# Patient Record
Sex: Female | Born: 2005
Health system: Southern US, Community
[De-identification: ages and names within clinical notes are randomized; demographics above are authoritative.]

## PROBLEM LIST (undated history)

## (undated) ENCOUNTER — Emergency Department (HOSPITAL_COMMUNITY): Admission: EM | Payer: Medicaid Other | Source: Home / Self Care

## (undated) ENCOUNTER — Emergency Department (HOSPITAL_COMMUNITY): Payer: Medicaid Other

## (undated) ENCOUNTER — Emergency Department (HOSPITAL_COMMUNITY): Admission: EM | Discharge status: Absent without leave | Payer: Medicaid Other

## (undated) DIAGNOSIS — F4310 Post-traumatic stress disorder, unspecified: Secondary | ICD-10-CM

## (undated) DIAGNOSIS — J45909 Unspecified asthma, uncomplicated: Secondary | ICD-10-CM

## (undated) DIAGNOSIS — F419 Anxiety disorder, unspecified: Secondary | ICD-10-CM

## (undated) DIAGNOSIS — F329 Major depressive disorder, single episode, unspecified: Secondary | ICD-10-CM

## (undated) DIAGNOSIS — I10 Essential (primary) hypertension: Secondary | ICD-10-CM

## (undated) DIAGNOSIS — R569 Unspecified convulsions: Secondary | ICD-10-CM

## (undated) DIAGNOSIS — G40909 Epilepsy, unspecified, not intractable, without status epilepticus: Secondary | ICD-10-CM

## (undated) DIAGNOSIS — F913 Oppositional defiant disorder: Secondary | ICD-10-CM

## (undated) DIAGNOSIS — F209 Schizophrenia, unspecified: Secondary | ICD-10-CM

## (undated) DIAGNOSIS — F3481 Disruptive mood dysregulation disorder: Secondary | ICD-10-CM

## (undated) HISTORY — DX: Essential (primary) hypertension: I10

## (undated) HISTORY — PX: BACK SURGERY: SHX140

## (undated) HISTORY — PX: CHOLECYSTECTOMY, LAPAROSCOPIC: SHX56

---
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
## 2023-01-03 ENCOUNTER — Other Ambulatory Visit: Payer: Self-pay

## 2023-01-03 ENCOUNTER — Encounter (HOSPITAL_BASED_OUTPATIENT_CLINIC_OR_DEPARTMENT_OTHER): Payer: Self-pay | Admitting: Emergency Medicine

## 2023-01-03 ENCOUNTER — Emergency Department (HOSPITAL_BASED_OUTPATIENT_CLINIC_OR_DEPARTMENT_OTHER)
Admission: EM | Admit: 2023-01-03 | Discharge: 2023-01-03 | Disposition: A | Discharge status: Home / Self Care | Payer: MEDICAID | Attending: Emergency Medicine | Admitting: Emergency Medicine

## 2023-01-03 ENCOUNTER — Emergency Department (HOSPITAL_BASED_OUTPATIENT_CLINIC_OR_DEPARTMENT_OTHER): Payer: MEDICAID

## 2023-01-03 DIAGNOSIS — Z91010 Allergy to peanuts: Secondary | ICD-10-CM | POA: Insufficient documentation

## 2023-01-03 DIAGNOSIS — R197 Diarrhea, unspecified: Secondary | ICD-10-CM

## 2023-01-03 DIAGNOSIS — R1031 Right lower quadrant pain: Secondary | ICD-10-CM | POA: Diagnosis not present

## 2023-01-03 DIAGNOSIS — R1032 Left lower quadrant pain: Secondary | ICD-10-CM | POA: Insufficient documentation

## 2023-01-03 DIAGNOSIS — R1030 Lower abdominal pain, unspecified: Secondary | ICD-10-CM

## 2023-01-03 LAB — COMPREHENSIVE METABOLIC PANEL
ALT: 24 U/L (ref 0–44)
AST: 21 U/L (ref 15–41)
Albumin: 3.9 g/dL (ref 3.5–5.0)
Alkaline Phosphatase: 111 U/L (ref 38–126)
Anion gap: 10 (ref 5–15)
BUN: 12 mg/dL (ref 6–20)
CO2: 26 mmol/L (ref 22–32)
Calcium: 9.5 mg/dL (ref 8.9–10.3)
Chloride: 104 mmol/L (ref 98–111)
Creatinine, Ser: 0.59 mg/dL (ref 0.44–1.00)
GFR, Estimated: >60 mL/min (ref >60)
Glucose, Bld: 100 mg/dL — ABNORMAL HIGH (ref 70–99)
Potassium: 2.9 mmol/L — ABNORMAL LOW (ref 3.5–5.1)
Sodium: 140 mmol/L (ref 135–145)
Total Bilirubin: 0.5 mg/dL (ref 0.3–1.2)
Total Protein: 8.1 g/dL (ref 6.5–8.1)

## 2023-01-03 LAB — HCG, SERUM, QUALITATIVE: Preg, Serum: NEGATIVE

## 2023-01-03 LAB — CBC
HCT: 36.6 % (ref 36.0–46.0)
Hemoglobin: 11.9 g/dL — ABNORMAL LOW (ref 12.0–15.0)
MCH: 27.7 pg (ref 26.0–34.0)
MCHC: 32.5 g/dL (ref 30.0–36.0)
MCV: 85.3 fL (ref 80.0–100.0)
Platelets: 420 10*3/uL — ABNORMAL HIGH (ref 150–400)
RBC: 4.29 MIL/uL (ref 3.87–5.11)
RDW: 13.6 % (ref 11.5–15.5)
WBC: 9.4 10*3/uL (ref 4.0–10.5)
nRBC: 0 % (ref 0.0–0.2)

## 2023-01-03 LAB — URINALYSIS, MICROSCOPIC (REFLEX)

## 2023-01-03 LAB — URINALYSIS, ROUTINE W REFLEX MICROSCOPIC
Bilirubin Urine: NEGATIVE
Glucose, UA: NEGATIVE mg/dL
Hgb urine dipstick: NEGATIVE
Ketones, ur: NEGATIVE mg/dL
Nitrite: NEGATIVE
Protein, ur: NEGATIVE mg/dL
Specific Gravity, Urine: 1.025 (ref 1.005–1.030)
pH: 7 (ref 5.0–8.0)

## 2023-01-03 LAB — LIPASE, BLOOD: Lipase: 29 U/L (ref 11–51)

## 2023-01-03 MED ORDER — METRONIDAZOLE 500 MG PO TABS: 500.0000 mg | ORAL_TABLET | Freq: Three times a day (TID) | ORAL | 0 refills | Status: DC

## 2023-01-03 MED ORDER — KETOROLAC TROMETHAMINE 30 MG/ML IJ SOLN
30.0000 mg | Freq: Once | INTRAMUSCULAR | Status: AC
  Administered 2023-01-03: 30 mg via INTRAVENOUS
  Filled 2023-01-03: qty 1

## 2023-01-03 MED ORDER — IOHEXOL 300 MG/ML  SOLN
100.0000 mL | Freq: Once | INTRAMUSCULAR | Status: AC | PRN
  Administered 2023-01-03: 100 mL via INTRAVENOUS

## 2023-01-03 MED ORDER — ONDANSETRON HCL 4 MG/2ML IJ SOLN
4.0000 mg | Freq: Once | INTRAMUSCULAR | Status: AC
  Administered 2023-01-03: 4 mg via INTRAVENOUS
  Filled 2023-01-03: qty 2

## 2023-01-03 NOTE — ED Triage Notes (Signed)
Patient arrived via EMS c/o abdominal pain x 2 weeks. Patient expresses diarrhea for same. Patient states 3 episodes today. Patient expresses nausea. Patient states RLQ/LLQ pain. Patient states 7/10 pain. Patient is AO x 4, VS WDL, slow gait.

## 2023-01-03 NOTE — ED Provider Notes (Signed)
[place] [facility] Provider Note   CSN: [id number] Arrival date & time: 01/03/23  0445     History  Chief Complaint  Patient presents with   Abdominal Pain    Amy Wall is a 17 y.o. female.  Patient is an 17 year old female with past medical history of oppositional defiant disorder, self mutilating behavior, major depression, cannabis use.  Patient presenting today with complaints of abdominal pain.  She describes a several week history of lower abdominal cramping and loose stools.  She denies any black or bloody stools.  No fevers or chills.  No ill contacts.  The history is provided by the patient.       Home Medications Prior to Admission medications   Medication Sig Start Date End Date Taking? Authorizing Provider  albuterol (VENTOLIN HFA) 108 (90 Base) MCG/ACT inhaler Inhale 2 puffs into the lungs every 6 (six) hours as needed for wheezing or shortness of breath. 07/08/22   Del Newman Nip, Tenna Child, FNP  budesonide-formoterol (SYMBICORT) 80-4.5 MCG/ACT inhaler Inhale 2 puffs into the lungs 2 (two) times daily. 07/08/22   Del Nigel Berthold, FNP  chlorhexidine (PERIDEX) 0.12 % solution Rinse with 1/2 oz twice a day & spit out.  Do not swallow. 12/20/22     EPINEPHrine 0.3 mg/0.3 mL IJ SOAJ injection Inject into  mid outer thigh 1 time for 1 dose. 12/13/22     hydrOXYzine (ATARAX) 25 MG tablet Take 50 mg by mouth every 6 (six) hours as needed. 10/23/22   [provider]  medroxyPROGESTERone (DEPO-PROVERA) 150 MG/ML injection Inject 1 mL (150 mg total) into the muscle every 3 (three) months. 07/29/22   Adline Potter, NP  ondansetron (ZOFRAN-ODT) 4 MG disintegrating tablet Dissolve 1 tablet (4 mg total) by mouth every 8 (eight) hours as needed for nausea. 12/17/22   Garlon Hatchet, PA-C  pantoprazole (PROTONIX) 40 MG tablet Take 1 tablet (40 mg total) by mouth daily. 12/27/22   Meredith Pel, NP  sertraline (ZOLOFT)  100 MG tablet Take 1 tablet (100 mg total) by mouth daily. 12/11/22     topiramate (TOPAMAX) 25 MG tablet Take 1 tablet (25 mg total) by mouth 2 (two) times daily. 12/11/22     traZODone (DESYREL) 100 MG tablet Take 1 tablet (100 mg total) by mouth every night. 12/11/22         Allergies    Peanut-containing drug products, Pineapple, Shellfish allergy, Strawberry extract, Charentais melon (french melon), and Chocolate    Review of Systems   Review of Systems  All other systems reviewed and are negative.   Physical Exam Updated Vital Signs BP (!) 143/86 (BP Location: Right Arm)   Pulse 90   Temp 98.3 F (36.8 C) (Oral)   Resp 17   Ht 5\' 3"  (1.6 m)   Wt 107.9 kg   LMP 12/22/2022 (Approximate)   SpO2 99%   BMI 42.14 kg/m  Physical Exam Vitals and nursing note reviewed.  Constitutional:      General: She is not in acute distress.    Appearance: She is well-developed. She is not diaphoretic.  HENT:     Head: Normocephalic and atraumatic.  Cardiovascular:     Rate and Rhythm: Normal rate and regular rhythm.     Heart sounds: No murmur heard.    No friction rub. No gallop.  Pulmonary:     Effort: Pulmonary effort is normal. No respiratory distress.     Breath  sounds: Normal breath sounds. No wheezing.  Abdominal:     General: Bowel sounds are normal. There is no distension.     Palpations: Abdomen is soft.     Tenderness: There is abdominal tenderness in the right lower quadrant, suprapubic area and left lower quadrant. There is no right CVA tenderness, left CVA tenderness, guarding or rebound.  Musculoskeletal:        General: Normal range of motion.     Cervical back: Normal range of motion and neck supple.  Skin:    General: Skin is warm and dry.  Neurological:     General: No focal deficit present.     Mental Status: She is alert and oriented to person, place, and time.     ED Results / Procedures / Treatments   Labs (all labs ordered are listed, but only abnormal  results are displayed) Labs Reviewed  LIPASE, BLOOD  COMPREHENSIVE METABOLIC PANEL  CBC  URINALYSIS, ROUTINE W REFLEX MICROSCOPIC  PREGNANCY, URINE    EKG None  Radiology No results found.  Procedures Procedures    Medications Ordered in ED Medications  ketorolac (TORADOL) 30 MG/ML injection 30 mg (has no administration in time range)  ondansetron (ZOFRAN) injection 4 mg (has no administration in time range)    ED Course/ Medical Decision Making/ A&P  Patient is an 17 year old female presenting with a 3-week history of lower abdominal cramping and loose stools.  Patient arrives here with stable vital signs and is afebrile.  She has tenderness across the lower abdomen, but no peritoneal signs.  Workup initiated including CBC, CMP, and lipase, all of which were basically normal.  Urinalysis clear and pregnancy test negative.  CT scan of the abdomen and pelvis showing no acute intra-abdominal process.  Patient given Toradol, Zofran, and IV fluids and seems to be feeling somewhat better.  I have witnessed no diarrhea throughout the entirety of her emergency department course.  Patient will be discharged with Flagyl given the prolonged nature of her diarrhea.  Patient has to follow-up if not improving.  Final Clinical Impression(s) / ED Diagnoses Final diagnoses:  None    Rx / DC Orders ED Discharge Orders     None         Geoffery Lyons, MD 01/03/23 [phone]

## 2023-01-03 NOTE — Discharge Instructions (Signed)
Begin taking Flagyl as prescribed.  Follow-up with your primary doctor if symptoms are not improving in the next few days.

## 2023-01-04 ENCOUNTER — Other Ambulatory Visit (HOSPITAL_COMMUNITY): Payer: Self-pay

## 2023-01-04 MED ORDER — METRONIDAZOLE 500 MG PO TABS
500.0000 mg | ORAL_TABLET | Freq: Three times a day (TID) | ORAL | 0 refills | Status: DC
  Filled 2023-01-04: qty 21, 7d supply, fill #0

## 2023-01-08 ENCOUNTER — Other Ambulatory Visit (HOSPITAL_COMMUNITY): Payer: Self-pay

## 2023-01-08 ENCOUNTER — Emergency Department (HOSPITAL_BASED_OUTPATIENT_CLINIC_OR_DEPARTMENT_OTHER)
Admission: EM | Admit: 2023-01-08 | Discharge: 2023-01-08 | Disposition: A | Discharge status: Home / Self Care | Payer: MEDICAID | Attending: Emergency Medicine | Admitting: Emergency Medicine

## 2023-01-08 ENCOUNTER — Encounter (HOSPITAL_BASED_OUTPATIENT_CLINIC_OR_DEPARTMENT_OTHER): Payer: Self-pay

## 2023-01-08 ENCOUNTER — Other Ambulatory Visit: Payer: Self-pay

## 2023-01-08 DIAGNOSIS — U071 COVID-19: Secondary | ICD-10-CM

## 2023-01-08 DIAGNOSIS — Z91010 Allergy to peanuts: Secondary | ICD-10-CM | POA: Insufficient documentation

## 2023-01-08 DIAGNOSIS — R112 Nausea with vomiting, unspecified: Secondary | ICD-10-CM

## 2023-01-08 DIAGNOSIS — R509 Fever, unspecified: Secondary | ICD-10-CM | POA: Diagnosis present

## 2023-01-08 LAB — RESP PANEL BY RT-PCR (RSV, FLU A&B, COVID)  RVPGX2
Influenza A by PCR: NEGATIVE
Influenza B by PCR: NEGATIVE
Resp Syncytial Virus by PCR: NEGATIVE
SARS Coronavirus 2 by RT PCR: POSITIVE — AB

## 2023-01-08 MED ORDER — PROCHLORPERAZINE MALEATE 10 MG PO TABS: 10.0000 mg | ORAL_TABLET | Freq: Three times a day (TID) | ORAL | 0 refills | Status: DC | PRN

## 2023-01-08 MED ORDER — ONDANSETRON 4 MG PO TBDP
4.0000 mg | ORAL_TABLET | Freq: Once | ORAL | Status: AC
  Administered 2023-01-08: 4 mg via ORAL
  Filled 2023-01-08: qty 1

## 2023-01-08 MED ORDER — PROCHLORPERAZINE MALEATE 10 MG PO TABS
10.0000 mg | ORAL_TABLET | Freq: Once | ORAL | Status: DC
  Filled 2023-01-08: qty 1

## 2023-01-08 NOTE — ED Notes (Signed)
This RN went to discharge pt and to administer compazine, however pt is not seen in RM, all monitoring device was laying on bed, no personal belongings noted in room. Assume pt has left the premises as pt also not seen to be in any of the restrooms. Thayer Ohm EMT-P witnessed pt leaving and showed her way to waiting room lobby a 10-15 mins ago. Compazine return to pyxis and discharge papers placed in shred bin.

## 2023-01-08 NOTE — ED Provider Notes (Signed)
[place] [facility]  Provider Note  CSN: [id number] Arrival date & time: 01/08/23 7829  History Chief Complaint  Patient presents with   Fever    Amy Wall is a 17 y.o. female with history of PNES, ODD, MDD and THC use with frequent ED visits for vomiting reports 1 day of sore throat, body aches, general malaise, low grade fever and vomiting. Took some nausea medication about 12 hours ago. Reported vomiting on the way here, drinking a Sprite during triage. She has been seen by GI, suspect THC as underlying issue. She continues to smoke THC but less often than previously.    Home Medications Prior to Admission medications   Medication Sig Start Date End Date Taking? Authorizing Provider  albuterol (VENTOLIN HFA) 108 (90 Base) MCG/ACT inhaler Inhale 2 puffs into the lungs every 6 (six) hours as needed for wheezing or shortness of breath. 07/08/22   Del Newman Nip, Tenna Child, FNP  budesonide-formoterol (SYMBICORT) 80-4.5 MCG/ACT inhaler Inhale 2 puffs into the lungs 2 (two) times daily. 07/08/22   Del Nigel Berthold, FNP  chlorhexidine (PERIDEX) 0.12 % solution Rinse with 1/2 oz twice a day & spit out.  Do not swallow. 12/20/22     EPINEPHrine 0.3 mg/0.3 mL IJ SOAJ injection Inject into  mid outer thigh 1 time for 1 dose. 12/13/22     hydrOXYzine (ATARAX) 25 MG tablet Take 50 mg by mouth every 6 (six) hours as needed. 10/23/22   [provider]  medroxyPROGESTERone (DEPO-PROVERA) 150 MG/ML injection Inject 1 mL (150 mg total) into the muscle every 3 (three) months. 07/29/22   Adline Potter, NP  metroNIDAZOLE (FLAGYL) 500 MG tablet Take 1 tablet (500 mg total) by mouth 3 (three) times daily. One po tid x 7 days 01/03/23   Geoffery Lyons, MD  metroNIDAZOLE (FLAGYL) 500 MG tablet Take 1 tablet (500 mg total) by mouth 3 (three) times daily for 7 days 01/03/23   Geoffery Lyons, MD  ondansetron (ZOFRAN-ODT) 4 MG disintegrating tablet Dissolve  1 tablet (4 mg total) by mouth every 8 (eight) hours as needed for nausea. 12/17/22   Garlon Hatchet, PA-C  pantoprazole (PROTONIX) 40 MG tablet Take 1 tablet (40 mg total) by mouth daily. 12/27/22   Meredith Pel, NP  sertraline (ZOLOFT) 100 MG tablet Take 1 tablet (100 mg total) by mouth daily. 12/11/22     topiramate (TOPAMAX) 25 MG tablet Take 1 tablet (25 mg total) by mouth 2 (two) times daily. 12/11/22     traZODone (DESYREL) 100 MG tablet Take 1 tablet (100 mg total) by mouth every night. 12/11/22        Allergies    Peanut-containing drug products, Pineapple, Shellfish allergy, Strawberry extract, Charentais melon (french melon), and Chocolate   Review of Systems   Review of Systems Please see HPI for pertinent positives and negatives  Physical Exam BP 128/83   Pulse (!) 108   Temp 99.7 F (37.6 C) (Oral)   Resp 18   LMP 12/22/2022 (Approximate)   SpO2 99%   Physical Exam Vitals and nursing note reviewed.  Constitutional:      Appearance: Normal appearance.  HENT:     Head: Normocephalic and atraumatic.     Nose: Nose normal.     Mouth/Throat:     Mouth: Mucous membranes are moist.  Eyes:     Extraocular Movements: Extraocular movements intact.     Conjunctiva/sclera: Conjunctivae normal.  Cardiovascular:  Rate and Rhythm: Normal rate.  Pulmonary:     Effort: Pulmonary effort is normal.     Breath sounds: Normal breath sounds.  Abdominal:     General: Abdomen is flat.     Palpations: Abdomen is soft.     Tenderness: There is no abdominal tenderness.  Musculoskeletal:        General: No swelling. Normal range of motion.     Cervical back: Neck supple.  Skin:    General: Skin is warm and dry.  Neurological:     General: No focal deficit present.     Mental Status: She is alert.  Psychiatric:        Mood and Affect: Mood normal.     ED Results / Procedures / Treatments   EKG None  Procedures Procedures  Medications Ordered in the ED Medications   ondansetron (ZOFRAN-ODT) disintegrating tablet 4 mg (4 mg Oral Given 01/08/23 0510)    Initial Impression and Plan  Patient well appearing, here with viral URI symptoms. Will check Covid/Flu/RSV swab, zofran for nausea. No indication for blood work or IVF.   ED Course   Clinical Course as of 01/08/23 0549  Sat Jan 08, 2023  0548 Covid is positive. Patient not vomiting in the ED. Recommend she continue with symptomatic care at home. PCP follow up, RTED for any other concerns.   [CS]    Clinical Course User Index [CS] Pollyann Savoy, MD     MDM Rules/Calculators/A&P Medical Decision Making Problems Addressed: COVID-19: acute illness or injury  Amount and/or Complexity of Data Reviewed Labs: ordered. Decision-making details documented in ED Course.  Risk Prescription drug management.     Final Clinical Impression(s) / ED Diagnoses Final diagnoses:  COVID-19    Rx / DC Orders ED Discharge Orders     None        Pollyann Savoy, MD 01/08/23 [phone]

## 2023-01-08 NOTE — ED Notes (Signed)
Pt has zofran at home from her visit on Monday. She reports having taken it 1 hour ago, and has vomited since.

## 2023-01-08 NOTE — ED Triage Notes (Signed)
Pt states that she has had fever, chills, headache, "runny nose", and nausea x 2 days.

## 2023-01-08 NOTE — ED Triage Notes (Signed)
The patient has covid. She has been vomiting today.

## 2023-01-08 NOTE — Discharge Instructions (Addendum)
You have been prescribed a different nausea medication to try called Compazine.  You may take 10 mg every 8 hours as needed for nausea and vomiting.  Do not take your Zofran while using this medication.  You may use up to 800mg  ibuprofen every 8 hours as needed for headache.  Do not exceed 2.4g of ibuprofen per day.  Return to the ER if you develop dizziness, shortness of breath, any other new or concerning symptoms.

## 2023-01-08 NOTE — ED Provider Notes (Signed)
[place] [facility] Provider Note   CSN: [id number] Arrival date & time: 01/08/23  2047     History  Chief Complaint  Patient presents with   Covid Positive   Emesis    Amy Wall is a 17 y.o. female ODD, MDD, who presents with concern for 3 episodes of vomiting today.  She was diagnosed with COVID yesterday and has Zofran at home which she has taken without relief from the vomiting.  She denies any difficulty breathing, fever or chills, dizziness.  Of note, she is seen frequently in the ER with complaints of nausea and vomiting which has been thought to be due to [facility] use.  Emesis      Home Medications Prior to Admission medications   Medication Sig Start Date End Date Taking? Authorizing Provider  prochlorperazine (COMPAZINE) 10 MG tablet Take 1 tablet (10 mg total) by mouth every 8 (eight) hours as needed for up to 3 days for nausea or vomiting. 01/08/23 01/11/23 Yes Arabella Merles, PA-C  albuterol (VENTOLIN HFA) 108 (90 Base) MCG/ACT inhaler Inhale 2 puffs into the lungs every 6 (six) hours as needed for wheezing or shortness of breath. 07/08/22   Del Newman Nip, Tenna Child, FNP  budesonide-formoterol (SYMBICORT) 80-4.5 MCG/ACT inhaler Inhale 2 puffs into the lungs 2 (two) times daily. 07/08/22   Del Nigel Berthold, FNP  chlorhexidine (PERIDEX) 0.12 % solution Rinse with 1/2 oz twice a day & spit out.  Do not swallow. 12/20/22     EPINEPHrine 0.3 mg/0.3 mL IJ SOAJ injection Inject into  mid outer thigh 1 time for 1 dose. 12/13/22     hydrOXYzine (ATARAX) 25 MG tablet Take 50 mg by mouth every 6 (six) hours as needed. 10/23/22   [provider]  medroxyPROGESTERone (DEPO-PROVERA) 150 MG/ML injection Inject 1 mL (150 mg total) into the muscle every 3 (three) months. 07/29/22   Adline Potter, NP  metroNIDAZOLE (FLAGYL) 500 MG tablet Take 1 tablet (500 mg total) by mouth 3 (three) times daily. One po tid x 7 days 01/03/23    Geoffery Lyons, MD  metroNIDAZOLE (FLAGYL) 500 MG tablet Take 1 tablet (500 mg total) by mouth 3 (three) times daily for 7 days 01/03/23   Geoffery Lyons, MD  ondansetron (ZOFRAN-ODT) 4 MG disintegrating tablet Dissolve 1 tablet (4 mg total) by mouth every 8 (eight) hours as needed for nausea. 12/17/22   Garlon Hatchet, PA-C  pantoprazole (PROTONIX) 40 MG tablet Take 1 tablet (40 mg total) by mouth daily. 12/27/22   Meredith Pel, NP  sertraline (ZOLOFT) 100 MG tablet Take 1 tablet (100 mg total) by mouth daily. 12/11/22     topiramate (TOPAMAX) 25 MG tablet Take 1 tablet (25 mg total) by mouth 2 (two) times daily. 12/11/22     traZODone (DESYREL) 100 MG tablet Take 1 tablet (100 mg total) by mouth every night. 12/11/22         Allergies    Peanut-containing drug products, Pineapple, Shellfish allergy, Strawberry extract, Charentais melon (french melon), and Chocolate    Review of Systems   Review of Systems  Gastrointestinal:  Positive for vomiting.    Physical Exam Updated Vital Signs BP 123/87   Pulse 93   Temp 99.4 F (37.4 C) (Oral)   Resp 18   Ht 5\' 3"  (1.6 m)   Wt 107 kg   LMP 12/22/2022 (Approximate)   SpO2 96%   BMI 41.79 kg/m  Physical Exam  Vitals and nursing note reviewed.  Constitutional:      General: She is not in acute distress.    Appearance: She is well-developed.     Comments: Well-appearing, no active vomiting on exam  HENT:     Head: Normocephalic and atraumatic.  Eyes:     Conjunctiva/sclera: Conjunctivae normal.  Cardiovascular:     Rate and Rhythm: Normal rate and regular rhythm.     Heart sounds: No murmur heard. Pulmonary:     Effort: Pulmonary effort is normal. No respiratory distress.     Breath sounds: Normal breath sounds.  Abdominal:     Palpations: Abdomen is soft.     Tenderness: There is no abdominal tenderness.  Musculoskeletal:        General: No swelling.     Cervical back: Neck supple.  Skin:    General: Skin is warm and dry.      Capillary Refill: Capillary refill takes less than 2 seconds.  Neurological:     Mental Status: She is alert.  Psychiatric:        Mood and Affect: Mood normal.     ED Results / Procedures / Treatments   Labs (all labs ordered are listed, but only abnormal results are displayed) Labs Reviewed - No data to display  EKG None  Radiology No results found.  Procedures Procedures    Medications Ordered in ED Medications  prochlorperazine (COMPAZINE) tablet 10 mg (has no administration in time range)    ED Course/ Medical Decision Making/ A&P                                 Medical Decision Making  17 y.o. female with pertinent past medical history of OOD, MDD, frequent visits to the ER for nausea and vomiting presents to the ED for concern of nausea and vomiting  Differential diagnosis includes but is not limited to nausea and vomiting associated with COVID, gastroenteritis  ED Course:  Patient concern for 3 episodes of vomiting earlier today that was not controlled with her Zofran which she has at home.  Upon evaluation today, she is not actively vomiting and is well-appearing.  Does not appear dry on exam, not hypotensive or tachycardic, no indication for IV fluids at this time.  She is hemodynamically stable, well appearing, appropriate for discharge at this time Will try compazine at home to see if this helps with her nausea and vomiting. She understands to discontinue use of zofran   Impression: COVID-19  Disposition:  The patient was discharged home with instructions to use Compazine as prescribed.  Discontinue use of Zofran. Return precautions given.  Lab Tests:  External records from outside source obtained and reviewed including ER visit from last night where she was diagnosed with COVID   Co morbidities that complicate the patient evaluation  ODD, MDD             Final Clinical Impression(s) / ED Diagnoses Final diagnoses:  Nausea and  vomiting, unspecified vomiting type  COVID-19    Rx / DC Orders ED Discharge Orders          Ordered    prochlorperazine (COMPAZINE) 10 MG tablet  Every 8 hours PRN        01/08/23 2142              Arabella Merles, PA-C 01/08/23 2214    Terrilee Files, MD 01/09/23 1021

## 2023-01-08 NOTE — ED Notes (Signed)
Pt's legal guardian was called [name]) to advised pt is d/c and reviewed discharge instructions and prescriptions, all questions and concerns answer.

## 2023-01-17 ENCOUNTER — Other Ambulatory Visit (HOSPITAL_COMMUNITY): Payer: Self-pay

## 2023-01-17 ENCOUNTER — Telehealth: Payer: Self-pay | Admitting: Internal Medicine

## 2023-01-17 DIAGNOSIS — R1013 Epigastric pain: Secondary | ICD-10-CM

## 2023-01-17 DIAGNOSIS — R197 Diarrhea, unspecified: Secondary | ICD-10-CM

## 2023-01-17 DIAGNOSIS — K529 Noninfective gastroenteritis and colitis, unspecified: Secondary | ICD-10-CM

## 2023-01-17 NOTE — Telephone Encounter (Signed)
Patient calls because she has continued to have abdominal pain and N&V despite the interventions started during her GI clinic visit on 8/19. These symptoms are the same symptoms she presented with during her last clinic visit, but she was said that she was told to call if her symptoms did not get better. She was diagnosed with COVID 1-2 weeks ago.  She is still able to tolerate some PO. Patient is hoping to get a sooner GI follow up appt and was told that there were some additional things that could be tested for when she was last seen in GI clinic. Will CC Willette Cluster to this telephone note.

## 2023-01-18 ENCOUNTER — Other Ambulatory Visit (HOSPITAL_COMMUNITY): Payer: Self-pay

## 2023-01-18 MED ORDER — PROCHLORPERAZINE MALEATE 10 MG PO TABS
10.0000 mg | ORAL_TABLET | Freq: Three times a day (TID) | ORAL | 0 refills | Status: DC
  Filled 2023-01-18: qty 9, 3d supply, fill #0

## 2023-01-19 ENCOUNTER — Other Ambulatory Visit (HOSPITAL_COMMUNITY): Payer: Self-pay

## 2023-01-20 ENCOUNTER — Encounter: Payer: Self-pay | Admitting: Internal Medicine

## 2023-01-20 ENCOUNTER — Other Ambulatory Visit (HOSPITAL_COMMUNITY)
Admission: RE | Admit: 2023-01-20 | Discharge: 2023-01-20 | Disposition: A | Discharge status: Home / Self Care | Payer: MEDICAID | Source: Ambulatory Visit | Attending: Internal Medicine | Admitting: Internal Medicine

## 2023-01-20 ENCOUNTER — Ambulatory Visit: Payer: MEDICAID | Admitting: Internal Medicine

## 2023-01-20 VITALS — BP 98/70 | HR 96 | Temp 99.6°F | Ht 63.0 in | Wt 225.8 lb

## 2023-01-20 DIAGNOSIS — Z113 Encounter for screening for infections with a predominantly sexual mode of transmission: Secondary | ICD-10-CM | POA: Diagnosis present

## 2023-01-20 DIAGNOSIS — Z3200 Encounter for pregnancy test, result unknown: Secondary | ICD-10-CM

## 2023-01-20 DIAGNOSIS — Z6841 Body Mass Index (BMI) 40.0 and over, adult: Secondary | ICD-10-CM

## 2023-01-20 DIAGNOSIS — R112 Nausea with vomiting, unspecified: Secondary | ICD-10-CM

## 2023-01-20 DIAGNOSIS — Z114 Encounter for screening for human immunodeficiency virus [HIV]: Secondary | ICD-10-CM

## 2023-01-20 DIAGNOSIS — Z91018 Allergy to other foods: Secondary | ICD-10-CM

## 2023-01-20 LAB — POCT PREGNANCY, URINE

## 2023-01-20 NOTE — Patient Instructions (Addendum)
Call GI doctor to set up a follow up appointment for nausea, vomiting, and diarrhea  No sexual activity
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
## 2023-01-21 ENCOUNTER — Other Ambulatory Visit: Payer: Self-pay | Admitting: *Deleted

## 2023-01-21 ENCOUNTER — Other Ambulatory Visit (HOSPITAL_COMMUNITY): Payer: Self-pay

## 2023-01-21 DIAGNOSIS — R112 Nausea with vomiting, unspecified: Secondary | ICD-10-CM

## 2023-01-21 DIAGNOSIS — R197 Diarrhea, unspecified: Secondary | ICD-10-CM

## 2023-01-21 DIAGNOSIS — K529 Noninfective gastroenteritis and colitis, unspecified: Secondary | ICD-10-CM

## 2023-01-21 DIAGNOSIS — R1013 Epigastric pain: Secondary | ICD-10-CM

## 2023-01-21 LAB — RPR: RPR Ser Ql: NONREACTIVE

## 2023-01-21 LAB — HEMOGLOBIN A1C: Hgb A1c MFr Bld: 5.2 % (ref 4.6–6.5)

## 2023-01-21 LAB — HIV ANTIBODY (ROUTINE TESTING W REFLEX): HIV 1&2 Ab, 4th Generation: NONREACTIVE

## 2023-01-21 MED ORDER — PROMETHAZINE HCL 25 MG PO TABS
12.5000 mg | ORAL_TABLET | Freq: Four times a day (QID) | ORAL | 0 refills | Status: DC | PRN
Start: 2023-01-21 — End: 2023-11-03
  Filled 2023-01-21: qty 20, 10d supply, fill #0

## 2023-01-21 MED ORDER — DICYCLOMINE HCL 20 MG PO TABS
20.0000 mg | ORAL_TABLET | Freq: Four times a day (QID) | ORAL | 1 refills | Status: DC | PRN
Start: 2023-01-21 — End: 2023-03-03
  Filled 2023-01-21: qty 40, 10d supply, fill #0

## 2023-01-21 NOTE — Telephone Encounter (Signed)
Unable to speak to the patient when trying to call to report Dr. Milas Hock recommendation. Called the patient's guardian and was able to inform her of the recommendations. Medications such as Bentyl and Phenergan were ordered; as well as labs. Ms. Janee Morn, the guardian, was also informed of the dietary restrictions and was also informed the patient should refrain from marijuana. She states she understood and agreed

## 2023-01-21 NOTE — Telephone Encounter (Signed)
Dr. Tomasa Rand patient. He supervised Gunnar Fusi. I will forward to him

## 2023-01-21 NOTE — Telephone Encounter (Signed)
Dr. Marina Goodell, Amy Wall's patient, please advise as DOD. Patient is still having issues with stomach pain, nausea and vomiting, and diarrhea. Note below is from 4 days ago.  Patient states Zofran is not working, nor is the fiber per instructed by provider. Patient called again this am:  Patient calls because she has continued to have abdominal pain and N&V despite the interventions started during her GI clinic visit on 8/19. These symptoms are the same symptoms she presented with during her last clinic visit, but she was said that she was told to call if her symptoms did not get better. She was diagnosed with COVID 1-2 weeks ago. She is still able to tolerate some PO. Patient is hoping to get a sooner GI follow up appt and was told that there were some additional things that could be tested for when she was last seen in GI clinic. Will CC Willette Cluster to this telephone note.

## 2023-01-21 NOTE — Telephone Encounter (Signed)
Inbound call from patient wishing to speak with nurse as soon as possible regarding previous note. Please advise, thank you.

## 2023-01-21 NOTE — Telephone Encounter (Signed)
Called patent in reference to a call 4 days ago. Unable to contact at this time. Guardian answered and informed the nurse of the contact number for the patient at [phone], called with no answer. I left a message for the patient to return my call.

## 2023-01-21 NOTE — Addendum Note (Signed)
Addended by: Michiel Sites on: 01/21/2023 03:58 PM   Modules accepted: Orders

## 2023-01-21 NOTE — Telephone Encounter (Signed)
Called patient via phone to make aware of Dr. Milas Hock orders, patient is not available. I left a message for the patient to return my call. Also informed patient that her guardian will be notified as well.

## 2023-01-24 ENCOUNTER — Other Ambulatory Visit: Payer: Self-pay | Admitting: Internal Medicine

## 2023-01-24 ENCOUNTER — Other Ambulatory Visit (HOSPITAL_COMMUNITY): Payer: Self-pay

## 2023-01-24 ENCOUNTER — Telehealth: Payer: Self-pay | Admitting: *Deleted

## 2023-01-24 DIAGNOSIS — B9689 Other specified bacterial agents as the cause of diseases classified elsewhere: Secondary | ICD-10-CM

## 2023-01-24 DIAGNOSIS — A599 Trichomoniasis, unspecified: Secondary | ICD-10-CM

## 2023-01-24 LAB — CERVICOVAGINAL ANCILLARY ONLY
Bacterial Vaginitis (gardnerella): POSITIVE — AB
Candida Glabrata: NEGATIVE
Candida Vaginitis: NEGATIVE
Chlamydia: NEGATIVE
Comment: NEGATIVE
Comment: NEGATIVE
Comment: NEGATIVE
Comment: NEGATIVE
Comment: NEGATIVE
Comment: NORMAL
Neisseria Gonorrhea: NEGATIVE
Trichomonas: POSITIVE — AB

## 2023-01-24 MED ORDER — IBUPROFEN 600 MG PO TABS
600.0000 mg | ORAL_TABLET | Freq: Every evening | ORAL | 1 refills | Status: DC
  Filled 2023-01-24: qty 16, 16d supply, fill #0
  Filled 2023-05-12: qty 16, 16d supply, fill #1

## 2023-01-24 MED ORDER — METRONIDAZOLE 500 MG PO TABS
500.0000 mg | ORAL_TABLET | Freq: Two times a day (BID) | ORAL | 0 refills | Status: AC
Start: 2023-01-24 — End: 2023-02-02
  Filled 2023-01-24: qty 14, 7d supply, fill #0

## 2023-01-24 NOTE — Telephone Encounter (Signed)
Patient returned the call from Friday on 01/21/23; patient was informed to stop smoking mariguana, start a bland diet and eat baked chicken, rice and cooked or steamed veggies. Avoid spicy foods, fatty/greasy foods and also avoid dairy products per Dr. Tomasa Rand. Patient agreed.

## 2023-01-25 ENCOUNTER — Telehealth: Payer: Self-pay | Admitting: Internal Medicine

## 2023-01-25 ENCOUNTER — Ambulatory Visit: Payer: MEDICAID | Admitting: Internal Medicine

## 2023-01-25 NOTE — Telephone Encounter (Signed)
Patient informed of test results

## 2023-01-25 NOTE — Telephone Encounter (Signed)
Patient called requesting to speak with nurse, patient stated she has started vomiting blood since she has started her " diet". Please advise.

## 2023-01-25 NOTE — Telephone Encounter (Signed)
Pt called and want to know if her test results came in yet

## 2023-01-25 NOTE — Telephone Encounter (Signed)
Called pts number and got no answer. Called pts guardian and was told the pt is otw to the emergency room. Explained that would be our recommendation if she were vomiting blood. Patient told her guardian that EMS was there to pick her up to take her to the ER.

## 2023-01-26 ENCOUNTER — Other Ambulatory Visit (HOSPITAL_COMMUNITY): Payer: Self-pay

## 2023-01-26 ENCOUNTER — Other Ambulatory Visit: Payer: MEDICAID

## 2023-01-26 DIAGNOSIS — R1013 Epigastric pain: Secondary | ICD-10-CM

## 2023-01-26 DIAGNOSIS — R112 Nausea with vomiting, unspecified: Secondary | ICD-10-CM | POA: Diagnosis not present

## 2023-01-26 DIAGNOSIS — R197 Diarrhea, unspecified: Secondary | ICD-10-CM | POA: Diagnosis not present

## 2023-01-26 DIAGNOSIS — K529 Noninfective gastroenteritis and colitis, unspecified: Secondary | ICD-10-CM

## 2023-01-26 LAB — SEDIMENTATION RATE: Sed Rate: 62 mm/h — ABNORMAL HIGH (ref 0–20)

## 2023-01-26 LAB — C-REACTIVE PROTEIN: CRP: 2.2 mg/dL (ref 0.5–20.0)

## 2023-01-29 ENCOUNTER — Other Ambulatory Visit (HOSPITAL_COMMUNITY): Payer: Self-pay

## 2023-01-30 LAB — GLIA (IGA/G) + TTG IGA
Antigliadin Abs, IgA: 2 units (ref 0–19)
Gliadin IgG: 1 units (ref 0–19)
Transglutaminase IgA: <2 U/mL (ref 0–3)

## 2023-01-31 ENCOUNTER — Ambulatory Visit: Payer: MEDICAID | Admitting: Internal Medicine

## 2023-02-01 ENCOUNTER — Ambulatory Visit: Payer: MEDICAID

## 2023-02-01 ENCOUNTER — Encounter: Payer: Self-pay | Admitting: Internal Medicine

## 2023-02-01 VITALS — BP 110/80 | HR 91 | Temp 98.0°F | Ht 63.0 in | Wt 221.0 lb

## 2023-02-01 DIAGNOSIS — Z3042 Encounter for surveillance of injectable contraceptive: Secondary | ICD-10-CM

## 2023-02-01 DIAGNOSIS — Z309 Encounter for contraceptive management, unspecified: Secondary | ICD-10-CM

## 2023-02-01 MED ORDER — MEDROXYPROGESTERONE ACETATE 150 MG/ML IM SUSY
PREFILLED_SYRINGE | Freq: Once | INTRAMUSCULAR | Status: AC
Start: 2023-02-01 — End: 2023-02-01

## 2023-02-01 NOTE — Progress Notes (Addendum)
  Last Depo-Provera: N/A. Side Effects if any: None. Serum HCG indicated? Negative . Depo-Provera 150 mg IM given by: Left deltoid . Next appointment due 05/03/23.

## 2023-02-02 ENCOUNTER — Encounter: Payer: Self-pay | Admitting: *Deleted

## 2023-02-02 LAB — ALPHA-GAL PANEL
Allergen, Mutton, f88: <0.1 kU/L
Allergen, Pork, f26: <0.1 kU/L
Beef: <0.1 kU/L
CLASS: 0
CLASS: 0
Class: 0
GALACTOSE-ALPHA-1,3-GALACTOSE IGE*: <0.1 kU/L (ref <0.10)

## 2023-02-02 LAB — INTERPRETATION:

## 2023-02-02 NOTE — Telephone Encounter (Signed)
Called patient and her guardian to explain the process of the scheduled Endoscopy on 03/16/23. Patient is still complaining about nausea, vomiting, and diarrhea. Willette Cluster, NP ordered Imodium 4 mg for the first diarrheal stool, then 2 mg for each diarrheal stool, up to 16 mg within a 24 hour period. Also spoke to both about the stool kits ordered for Calprotectin, H. Pylori and Pancreatic elastase. Patient picked up the stool kit, but never returned the samples. Informed the patient, these samples needed to be turned into the lab after completion. Gunnar Fusi ordered [employer] OTC medication for nausea and vomiting due to the inability of medications she is presently taking, not working. Informed the gaurdian the medication needs to be taken via the directions on the bottle. Also spoke with the guardian in reference to the endoscopy and having to be available for the patient while she is undergoing the procedure, having to be here 1 hour prior to scheduled time, and having someone also available to drive her home after the procedure. Informed both the guardian and the patient about the pre-visit which is also scheduled and any question that need to be asked, can be done at that time. Guardian wanted to know about the consent signing as well as the reason behind needing the procedure. Inform her a result can be mailed to her to show the reason the procedure is recommended. Both the patient and the guardian were satisfied, understood and agreed. Result note is being mailed to the guardian.

## 2023-02-02 NOTE — Telephone Encounter (Signed)
Called patient in reference to a call received today without noting why she wants to speak to a nurse. Patient states she is still having problems with stomach pains, vomiting and diarrhea. Patient states her diarrhea started today and she has been having diarrhea stools 4 times today already. Patient has medications she takes for these symptoms, she states the meds do not work and wants to know what else can be done? Dr. Tomasa Rand has ordered a endo procedure and patient has been scheduled for 03/16/23 with a pre-visit scheduled for 02/23/23. Patient is agreeable to having the endoscopy, wants something else at present for the nausea and discomfort. Patient was also informed to stop smoking marijuana, states she stopped smoking when told initially. Please advise.

## 2023-02-02 NOTE — Telephone Encounter (Signed)
Patient called back requesting for a nurse to call her social worker at [phone] [name].

## 2023-02-02 NOTE — Telephone Encounter (Signed)
Patient called requesting a call back from nurse. Did not give any information as what it is about. Please advise.

## 2023-02-10 ENCOUNTER — Emergency Department (HOSPITAL_COMMUNITY)
Admission: EM | Admit: 2023-02-10 | Discharge: 2023-02-12 | Disposition: A | Discharge status: Home / Self Care | Payer: MEDICAID | Attending: Emergency Medicine | Admitting: Emergency Medicine

## 2023-02-10 ENCOUNTER — Encounter (HOSPITAL_COMMUNITY): Payer: Self-pay

## 2023-02-10 ENCOUNTER — Other Ambulatory Visit: Payer: Self-pay

## 2023-02-10 DIAGNOSIS — T1491XA Suicide attempt, initial encounter: Secondary | ICD-10-CM | POA: Diagnosis present

## 2023-02-10 DIAGNOSIS — T39312A Poisoning by propionic acid derivatives, intentional self-harm, initial encounter: Secondary | ICD-10-CM | POA: Insufficient documentation

## 2023-02-10 DIAGNOSIS — T50902A Poisoning by unspecified drugs, medicaments and biological substances, intentional self-harm, initial encounter: Secondary | ICD-10-CM

## 2023-02-10 DIAGNOSIS — Z91010 Allergy to peanuts: Secondary | ICD-10-CM | POA: Insufficient documentation

## 2023-02-10 DIAGNOSIS — X838XXA Intentional self-harm by other specified means, initial encounter: Secondary | ICD-10-CM | POA: Diagnosis not present

## 2023-02-10 DIAGNOSIS — T450X2A Poisoning by antiallergic and antiemetic drugs, intentional self-harm, initial encounter: Secondary | ICD-10-CM | POA: Insufficient documentation

## 2023-02-10 DIAGNOSIS — F332 Major depressive disorder, recurrent severe without psychotic features: Secondary | ICD-10-CM | POA: Diagnosis present

## 2023-02-10 LAB — COMPREHENSIVE METABOLIC PANEL
ALT: 15 U/L (ref 0–44)
AST: 18 U/L (ref 15–41)
Albumin: 3.4 g/dL — ABNORMAL LOW (ref 3.5–5.0)
Alkaline Phosphatase: 81 U/L (ref 38–126)
Anion gap: 12 (ref 5–15)
BUN: 9 mg/dL (ref 6–20)
CO2: 21 mmol/L — ABNORMAL LOW (ref 22–32)
Calcium: 8.9 mg/dL (ref 8.9–10.3)
Chloride: 104 mmol/L (ref 98–111)
Creatinine, Ser: 0.42 mg/dL — ABNORMAL LOW (ref 0.44–1.00)
GFR, Estimated: >60 mL/min (ref >60)
Glucose, Bld: 102 mg/dL — ABNORMAL HIGH (ref 70–99)
Potassium: 3.2 mmol/L — ABNORMAL LOW (ref 3.5–5.1)
Sodium: 137 mmol/L (ref 135–145)
Total Bilirubin: 0.2 mg/dL — ABNORMAL LOW (ref 0.3–1.2)
Total Protein: 7.1 g/dL (ref 6.5–8.1)

## 2023-02-10 LAB — CBC WITH DIFFERENTIAL/PLATELET
Abs Immature Granulocytes: 0.03 10*3/uL (ref 0.00–0.07)
Basophils Absolute: 0 10*3/uL (ref 0.0–0.1)
Basophils Relative: 0 %
Eosinophils Absolute: 0.1 10*3/uL (ref 0.0–0.5)
Eosinophils Relative: 1 %
HCT: 35 % — ABNORMAL LOW (ref 36.0–46.0)
Hemoglobin: 11.2 g/dL — ABNORMAL LOW (ref 12.0–15.0)
Immature Granulocytes: 0 %
Lymphocytes Relative: 31 %
Lymphs Abs: 3.2 10*3/uL (ref 0.7–4.0)
MCH: 28.3 pg (ref 26.0–34.0)
MCHC: 32 g/dL (ref 30.0–36.0)
MCV: 88.4 fL (ref 80.0–100.0)
Monocytes Absolute: 0.8 10*3/uL (ref 0.1–1.0)
Monocytes Relative: 8 %
Neutro Abs: 6.1 10*3/uL (ref 1.7–7.7)
Neutrophils Relative %: 60 %
Platelets: 428 10*3/uL — ABNORMAL HIGH (ref 150–400)
RBC: 3.96 MIL/uL (ref 3.87–5.11)
RDW: 13.1 % (ref 11.5–15.5)
WBC: 10.2 10*3/uL (ref 4.0–10.5)
nRBC: 0 % (ref 0.0–0.2)

## 2023-02-10 LAB — HCG, SERUM, QUALITATIVE: Preg, Serum: NEGATIVE

## 2023-02-10 LAB — RAPID URINE DRUG SCREEN, HOSP PERFORMED
Amphetamines: NOT DETECTED
Barbiturates: NOT DETECTED
Benzodiazepines: NOT DETECTED
Cocaine: NOT DETECTED
Opiates: NOT DETECTED
Tetrahydrocannabinol: NOT DETECTED

## 2023-02-10 LAB — ACETAMINOPHEN LEVEL
Acetaminophen (Tylenol), Serum: <10 ug/mL — ABNORMAL LOW (ref 10–30)
Acetaminophen (Tylenol), Serum: 11 ug/mL (ref 10–30)

## 2023-02-10 LAB — SALICYLATE LEVEL: Salicylate Lvl: <7 mg/dL — ABNORMAL LOW (ref 7.0–30.0)

## 2023-02-10 LAB — ETHANOL: Alcohol, Ethyl (B): <10 mg/dL (ref <10)

## 2023-02-10 MED ORDER — DICYCLOMINE HCL 20 MG PO TABS: 20.0000 mg | ORAL_TABLET | Freq: Four times a day (QID) | ORAL | Status: DC | PRN

## 2023-02-10 MED ORDER — TRAZODONE HCL 100 MG PO TABS
100.0000 mg | ORAL_TABLET | Freq: Every day | ORAL | Status: DC
  Administered 2023-02-10 – 2023-02-11 (×2): 100 mg via ORAL
  Filled 2023-02-10 (×2): qty 1

## 2023-02-10 MED ORDER — MOMETASONE FURO-FORMOTEROL FUM 100-5 MCG/ACT IN AERO: 2.0000 | INHALATION_SPRAY | Freq: Two times a day (BID) | RESPIRATORY_TRACT | Status: DC | PRN

## 2023-02-10 MED ORDER — PROMETHAZINE HCL 25 MG PO TABS: 12.5000 mg | ORAL_TABLET | Freq: Four times a day (QID) | ORAL | Status: DC | PRN

## 2023-02-10 MED ORDER — IBUPROFEN 200 MG PO TABS: 600.0000 mg | ORAL_TABLET | Freq: Three times a day (TID) | ORAL | Status: DC | PRN

## 2023-02-10 MED ORDER — PANTOPRAZOLE SODIUM 40 MG PO TBEC
40.0000 mg | DELAYED_RELEASE_TABLET | Freq: Every day | ORAL | Status: DC
  Administered 2023-02-11 – 2023-02-12 (×2): 40 mg via ORAL
  Filled 2023-02-10 (×3): qty 1

## 2023-02-10 MED ORDER — ACETAMINOPHEN 325 MG PO TABS
650.0000 mg | ORAL_TABLET | ORAL | Status: DC | PRN
  Administered 2023-02-11: 650 mg via ORAL
  Filled 2023-02-10: qty 2

## 2023-02-10 MED ORDER — ALBUTEROL SULFATE HFA 108 (90 BASE) MCG/ACT IN AERS: 2.0000 | INHALATION_SPRAY | Freq: Four times a day (QID) | RESPIRATORY_TRACT | Status: DC | PRN

## 2023-02-10 MED ORDER — SERTRALINE HCL 50 MG PO TABS
100.0000 mg | ORAL_TABLET | Freq: Every day | ORAL | Status: DC
  Administered 2023-02-11 – 2023-02-12 (×2): 100 mg via ORAL
  Filled 2023-02-10 (×3): qty 2

## 2023-02-10 MED ORDER — ONDANSETRON HCL 4 MG PO TABS: 4.0000 mg | ORAL_TABLET | Freq: Three times a day (TID) | ORAL | Status: DC | PRN

## 2023-02-10 MED ORDER — ALUM & MAG HYDROXIDE-SIMETH 200-200-20 MG/5ML PO SUSP
30.0000 mL | Freq: Four times a day (QID) | ORAL | Status: DC | PRN
  Filled 2023-02-10: qty 30

## 2023-02-10 MED ORDER — TOPIRAMATE 25 MG PO TABS
25.0000 mg | ORAL_TABLET | Freq: Two times a day (BID) | ORAL | Status: DC
  Administered 2023-02-10 – 2023-02-12 (×4): 25 mg via ORAL
  Filled 2023-02-10 (×5): qty 1

## 2023-02-10 NOTE — ED Notes (Signed)
Patient to room 42. Patient ambulated to room .   Patient oriented to unit and room.   Patient resting at this time.  Breathes equal and unlabored.

## 2023-02-10 NOTE — ED Notes (Signed)
[name] spoke with [name] (Legal Guardian) for collateral. Pts guardian reports that pt has a history of self injurious behaviors such as cutting and overdosing with pills. Pts guardian believes that some of pts behavior is for attention. Pt has a number of medical issues that she is struggling with including GI and dental issues.   Pt has little support being estranged from family members and having grown up in foster care since the age of 64. Pt lived in a group home up until two months ago. Pt currently lives in a hotel.  Pts ACT team is through [employer] [employer]  [name], [facility]  02/10/23

## 2023-02-10 NOTE — ED Provider Notes (Signed)
Care transferred to me.  Patient's 4-hour Tylenol level is 11 which is well below the treatment line.  Repeat EKG shows a normal QTc.  Has some anterior T wave inversions but when I am talking to her she denies chest pain.  I suspect these are from lead placement.  She is medically stable for psychiatric consultation.    Pricilla Loveless, MD 02/10/23 [phone]

## 2023-02-10 NOTE — ED Triage Notes (Signed)
Pt presents via EMS c/o intentional drug ingestion. Pt reports took several of her own medications to "numb the pain". Denies SI at this time. EMS reports possible took Motrin, Zofran, Compazine, and Dayquil. Pt unable to verbalize which meds were taken.

## 2023-02-10 NOTE — ED Notes (Addendum)
Just spoke to  "sister" asking for a update and that if pt doesn't not have a ride home to call her and she will get her if pt ever gets discharged from ER.  [phone]. Sister recommends call her as soon as pt wakes up.

## 2023-02-10 NOTE — Progress Notes (Signed)
LCSW Progress Note  [id number]   SHELENE KRAGE  02/10/2023  10:21 PM    Inpatient Behavioral Health Placement  Pt meets inpatient criteria per [name], PMHNP. There are no available beds within [facility] per [facility] AC Kelly Southard,RN. Referral was sent to the following facilities;    Destination  Service Provider Address Phone Fax  [facility]  [address] [place] [address] [phone]  [facility]  [address] [place], [place] [address] [phone]  [facility]  [address], [place] [address] [phone]  [facility]  [address] [place] [address] [phone]  [facility] [address]  [address] [place] [address] [phone]  [facility]  [address] [place], [place] [address] [phone]  [facility]  [address] [place] [address] [phone]  [facility]  [address] [place] [address] [phone]  [facility]  [address], [place] [address] [phone]  [facility] EFAX  [address] [place], [place] [phone]  [facility]  [place] [phone]  [facility]  [address] [place], [place] [address] [phone]  [facility]  [address] [place] [address] [phone]  [facility]  [address], [place] [address] [phone]  [facility]  [address] [place] [address] [phone]  [facility]  [address], [place] [address] [phone]  [facility]  [address] [place], [place] [address] [phone]  [facility]  [address] [place] [address] [phone]  [facility]  [address] [place] [address] [phone]     Situation ongoing,  CSW will follow up.    Maryjean Ka, MSW, [facility] 02/10/2023 10:21 PM

## 2023-02-10 NOTE — Consult Note (Signed)
[facility] ED ASSESSMENT   Reason for Consult: Psych Consult Referring Physician: Dr. Criss Alvine  Patient Identification: Amy Wall MRN:  [id number] ED Chief Complaint: MDD (major depressive disorder), recurrent severe, without psychosis (HCC)  Diagnosis:  Principal Problem:   MDD (major depressive disorder), recurrent severe, without psychosis (HCC) Active Problems:   Suicidal behavior with attempted self-injury [facility])   ED Assessment Time Calculation: Start Time: 0945 Stop Time: 1025 Total Time in Minutes (Assessment Completion): 40   Subjective: Amy Wall is a 17 y.o. female patient presents via EMS c/o intentional drug ingestion. Pt reports took several of her own medications to "numb the pain". Denies SI at this time. EMS reports possible took Motrin, Zofran, Compazine, and Dayquil. Pt unable to verbalize which meds were taken.     HPI: Amy Wall, 17 y.o., female patient seen face to face by this provider, consulted with Dr. Lucianne Muss; and chart reviewed on 02/10/23.  On evaluation Amy Wall reports that she has been going through a lot of stress, because of dealing with her past.  She states that she has been staying in a hotel for a couple of months, due to leaving the group home she was staying in. She currently denies SI/HI/AVH, says she has no contact with her biological parents or any family and has been in DSS system since the age of 89. She says she does not have a job, and just stays in the hotel room and watch tv and think about her past, feeling depressed. Patient says she has a felony charge due to assaulting a security guard, and her next court date is March 23, 2023. She also says she has a 2024 larceny charge that has been resolved. Patient says that she has had multiple psychiatric diagnosis and multiple inpatient admissions and her last admission was two months ago , unable to recall the name of the inpatient facility. She says she is supposed to be  on medication but is non-compliant because she does not like the way the medications make her feel and "I don't like meds". Patient has a community support team, but does not feel they listen to her, says she has a hard time, with financial and food help from them. She wants a good support system, says she is tired of doing things alone, and it feels like no one wants to help her.  Patient endorses that she has had therapy in the past, but feels like her therapist were not listening to her, but is open to having therapy again.  She also endorses having a past history of overdose, which led to having her to get her gallbladder removed, she now has an appointment for endoscopy procedure this month in October to see if she has ulcers.  Patient also endorses smoking marijuana, drinking alcohol, states she drinks liquor, not really sure how much she drinks daily, also vapes nicotine.  Patient UDS is negative for any illicit substances, BAL is less than 10.  During evaluation Amy Wall is laying in bed, just waking up, eating breakfast in no acute distress. She is alert, oriented x 4, calm, cooperative and attentive. Her mood is sad with congruent affect. She has normal speech, and behavior.  Objectively there is no evidence of psychosis/mania or delusional thinking.  Patient is able to converse coherently, goal directed thoughts, no distractibility, or pre-occupation.  She also denies suicidal/self-harm/homicidal ideation, psychosis, and paranoia.  Patient answered question appropriately.     Psychiatric History: MDD  With psychosis, DMDD (disruptive mood dysregulation disorder), cannabis use disorder, oppositional defiant disorder, PTSD, anxiety and SI.  Patient reports schizophrenia diagnosis   Risk to Self or Others: Is the patient at risk to self? Yes Has the patient been a risk to self in the past 6 months? Yes Has the patient been a risk to self within the distant past? Yes Is the patient a risk  to others? No Has the patient been a risk to others in the past 6 months? No Has the patient been a risk to others within the distant past? No  [place] Scale:  Flowsheet Row ED from 02/10/2023 in [facility] Emergency Department at [facility] Most recent reading at 02/10/2023 11:40 AM ED from 01/08/2023 in [facility] Emergency Department at [facility] Most recent reading at 01/08/2023  8:52 PM ED from 01/08/2023 in [facility] Emergency Department at [facility] Most recent reading at 01/08/2023  4:47 AM  C-SSRS RISK CATEGORY No Risk No Risk No Risk       AIMS:  , , ,  ,   ASAM:    Substance Abuse:     Past Medical History:  Past Medical History:  Diagnosis Date   Anxiety    Asthma    DMDD (disruptive mood dysregulation disorder) (HCC)    Epilepsy (HCC)    pseuodoseizures per chart   HTN (hypertension)    Major depressive disorder    Oppositional defiant disorder    PTSD (post-traumatic stress disorder)    Schizophrenia (HCC)    Seizures (HCC)     Past Surgical History:  Procedure Laterality Date   BACK SURGERY     CHOLECYSTECTOMY, LAPAROSCOPIC     Family History:  Family History  Family history unknown: Yes    Social History:  Social History   Substance and Sexual Activity  Alcohol Use Not Currently   Alcohol/week: 4.0 standard drinks of alcohol   Types: 2 Glasses of wine, 2 Shots of liquor per week     Social History   Substance and Sexual Activity  Drug Use Yes   Types: Marijuana, Methamphetamines   Comment: daily    Social History   Socioeconomic History   Marital status: Single    Spouse name: Not on file   Number of children: 0   Years of education: Not on file   Highest education level: Not on file  Occupational History   Not on file  Tobacco Use   Smoking status: Every Day    Types: Cigars    Passive exposure: Yes   Smokeless tobacco: Never   Tobacco comments:    Lack and mild, marijuana  Vaping Use    Vaping status: Every Day  Substance and Sexual Activity   Alcohol use: Not Currently    Alcohol/week: 4.0 standard drinks of alcohol    Types: 2 Glasses of wine, 2 Shots of liquor per week   Drug use: Yes    Types: Marijuana, Methamphetamines    Comment: daily   Sexual activity: Yes    Birth control/protection: Condom  Other Topics Concern   Not on file  Social History Narrative   She lives with her uncle and aunt.   She has six siblings.   Social Determinants of Health   Financial Resource Strain: Medium Risk (06/28/2022)   Overall Financial Resource Strain (CARDIA)    Difficulty of Paying Living Expenses: Somewhat hard  Food Insecurity: No Food Insecurity (09/23/2022)   Hunger Vital Sign  Worried About Programme researcher, broadcasting/film/video in the Last Year: Never true    Ran Out of Food in the Last Year: Never true  Transportation Needs: No Transportation Needs (09/23/2022)   PRAPARE - Administrator, Civil Service (Medical): No    Lack of Transportation (Non-Medical): No  Physical Activity: Inactive (06/28/2022)   Exercise Vital Sign    Days of Exercise per Week: 0 days    Minutes of Exercise per Session: 0 min  Stress: No Stress Concern Present (06/28/2022)   [employer] of Occupational Health - Occupational Stress Questionnaire    Feeling of Stress : Only a little  Social Connections: Moderately Isolated (06/28/2022)   Social Connection and Isolation Panel [NHANES]    Frequency of Communication with Friends and Family: Once a week    Frequency of Social Gatherings with Friends and Family: Once a week    Attends Religious Services: More than 4 times per year    Active Member of [employer] or Organizations: Yes    Attends Banker Meetings: 1 to 4 times per year    Marital Status: Never married      Allergies:   Allergies  Allergen Reactions   Peanut-Containing Drug Products Anaphylaxis, Hives and Itching   Pineapple Anaphylaxis and Hives   Shellfish Allergy  Anaphylaxis and Hives   Strawberry Extract Anaphylaxis and Hives   Charentais Melon (French Melon) Swelling    Other Reaction(s): Other (see comments)  Numbness and swelling of throat and tongue.   Chocolate Itching    Throat close    Labs:  Results for orders placed or performed during the hospital encounter of 02/10/23 (from the past 48 hour(s))  Comprehensive metabolic panel     Status: Abnormal   Collection Time: 02/10/23  4:00 AM  Result Value Ref Range   Sodium 137 135 - 145 mmol/L   Potassium 3.2 (L) 3.5 - 5.1 mmol/L   Chloride 104 98 - 111 mmol/L   CO2 21 (L) 22 - 32 mmol/L   Glucose, Bld 102 (H) 70 - 99 mg/dL    Comment: Glucose reference range applies only to samples taken after fasting for at least 8 hours.   BUN 9 6 - 20 mg/dL   Creatinine, Ser 1.61 (L) 0.44 - 1.00 mg/dL   Calcium 8.9 8.9 - 09.6 mg/dL   Total Protein 7.1 6.5 - 8.1 g/dL   Albumin 3.4 (L) 3.5 - 5.0 g/dL   AST 18 15 - 41 U/L   ALT 15 0 - 44 U/L   Alkaline Phosphatase 81 38 - 126 U/L   Total Bilirubin 0.2 (L) 0.3 - 1.2 mg/dL   GFR, Estimated >04 >54 mL/min    Comment: (NOTE) Calculated using the CKD-EPI Creatinine Equation (2021)    Anion gap 12 5 - 15    Comment: Performed at [facility], [address] [place], [place] [address]  Ethanol     Status: None   Collection Time: 02/10/23  4:00 AM  Result Value Ref Range   Alcohol, Ethyl (B) <10 <10 mg/dL    Comment: (NOTE) Lowest detectable limit for serum alcohol is 10 mg/dL.  For medical purposes only. Performed at [facility], [address], [place], [place] [address]   CBC with Diff     Status: Abnormal   Collection Time: 02/10/23  4:00 AM  Result Value Ref Range   WBC 10.2 4.0 - 10.5 K/uL   RBC 3.96 3.87 -  5.11 MIL/uL   Hemoglobin 11.2 (L) 12.0 - 15.0 g/dL   HCT 34.7 (L) 42.5 - 95.6 %   MCV 88.4 80.0 - 100.0 fL   MCH 28.3 26.0 - 34.0 pg   MCHC 32.0 30.0 - 36.0 g/dL   RDW 38.7 56.4 - 33.2 %    Platelets 428 (H) 150 - 400 K/uL   nRBC 0.0 0.0 - 0.2 %   Neutrophils Relative % 60 %   Neutro Abs 6.1 1.7 - 7.7 K/uL   Lymphocytes Relative 31 %   Lymphs Abs 3.2 0.7 - 4.0 K/uL   Monocytes Relative 8 %   Monocytes Absolute 0.8 0.1 - 1.0 K/uL   Eosinophils Relative 1 %   Eosinophils Absolute 0.1 0.0 - 0.5 K/uL   Basophils Relative 0 %   Basophils Absolute 0.0 0.0 - 0.1 K/uL   Immature Granulocytes 0 %   Abs Immature Granulocytes 0.03 0.00 - 0.07 K/uL    Comment: Performed at [facility], [address], [place], [place] [address]  hCG, serum, qualitative     Status: None   Collection Time: 02/10/23  4:00 AM  Result Value Ref Range   Preg, Serum NEGATIVE NEGATIVE    Comment:        THE SENSITIVITY OF THIS METHODOLOGY IS >10 mIU/mL. Performed at [facility], [address] [place], [place] [address]   Salicylate level     Status: Abnormal   Collection Time: 02/10/23  4:00 AM  Result Value Ref Range   Salicylate Lvl <7.0 (L) 7.0 - 30.0 mg/dL    Comment: Performed at [facility], [address] [place], [place] [address]  Acetaminophen level     Status: Abnormal   Collection Time: 02/10/23  4:00 AM  Result Value Ref Range   Acetaminophen (Tylenol), Serum <10 (L) 10 - 30 ug/mL    Comment: (NOTE) Therapeutic concentrations vary significantly. A range of 10-30 ug/mL  may be an effective concentration for many patients. However, some  are best treated at concentrations outside of this range. Acetaminophen concentrations >150 ug/mL at 4 hours after ingestion  and >50 ug/mL at 12 hours after ingestion are often associated with  toxic reactions.  Performed at [facility] [address], [place], [place] [address]   Urine rapid drug screen (hosp performed)     Status: None   Collection Time: 02/10/23  6:32 AM  Result Value Ref Range   Opiates NONE DETECTED NONE DETECTED   Cocaine NONE DETECTED NONE  DETECTED   Benzodiazepines NONE DETECTED NONE DETECTED   Amphetamines NONE DETECTED NONE DETECTED   Tetrahydrocannabinol NONE DETECTED NONE DETECTED   Barbiturates NONE DETECTED NONE DETECTED    Comment: (NOTE) DRUG SCREEN FOR MEDICAL PURPOSES ONLY.  IF CONFIRMATION IS NEEDED FOR ANY PURPOSE, NOTIFY LAB WITHIN 5 DAYS.  LOWEST DETECTABLE LIMITS FOR URINE DRUG SCREEN Drug Class                     Cutoff (ng/mL) Amphetamine and metabolites    1000 Barbiturate and metabolites    200 Benzodiazepine                 200 Opiates and metabolites        300 Cocaine and metabolites        300 THC  50 Performed at [facility], [address] [place], [place] [address]   Acetaminophen level     Status: None   Collection Time: 02/10/23  7:30 AM  Result Value Ref Range   Acetaminophen (Tylenol), Serum 11 10 - 30 ug/mL    Comment: (NOTE) Therapeutic concentrations vary significantly. A range of 10-30 ug/mL  may be an effective concentration for many patients. However, some  are best treated at concentrations outside of this range. Acetaminophen concentrations >150 ug/mL at 4 hours after ingestion  and >50 ug/mL at 12 hours after ingestion are often associated with  toxic reactions.  Performed at [facility], [address] [place], [place] [address]     Current Facility-Administered Medications  Medication Dose Route Frequency Provider Last Rate Last Admin   acetaminophen (TYLENOL) tablet 650 mg  650 mg Oral Q4H PRN Pricilla Loveless, MD       albuterol (VENTOLIN HFA) 108 (90 Base) MCG/ACT inhaler 2 puff  2 puff Inhalation Q6H PRN Pricilla Loveless, MD       alum & mag hydroxide-simeth (MAALOX/MYLANTA) 200-200-20 MG/5ML suspension 30 mL  30 mL Oral Q6H PRN Pricilla Loveless, MD       dicyclomine (BENTYL) tablet 20 mg  20 mg Oral Q6H PRN Pricilla Loveless, MD       mometasone-formoterol (DULERA) 100-5 MCG/ACT inhaler 2 puff  2  puff Inhalation BID PRN Pricilla Loveless, MD       ondansetron [facility]) tablet 4 mg  4 mg Oral Q8H PRN Pricilla Loveless, MD       pantoprazole (PROTONIX) EC tablet 40 mg  40 mg Oral Daily Pricilla Loveless, MD   40 mg at 02/10/23 1143   promethazine (PHENERGAN) tablet 12.5 mg  12.5 mg Oral Q6H PRN Pricilla Loveless, MD       sertraline (ZOLOFT) tablet 100 mg  100 mg Oral Daily Pricilla Loveless, MD   100 mg at 02/10/23 1142   topiramate (TOPAMAX) tablet 25 mg  25 mg Oral BID Pricilla Loveless, MD   25 mg at 02/10/23 1142   traZODone (DESYREL) tablet 100 mg  100 mg Oral QHS Pricilla Loveless, MD       Current Outpatient Medications  Medication Sig Dispense Refill   albuterol (VENTOLIN HFA) 108 (90 Base) MCG/ACT inhaler Inhale 2 puffs into the lungs every 6 (six) hours as needed for wheezing or shortness of breath. 8 g 2   budesonide-formoterol (SYMBICORT) 80-4.5 MCG/ACT inhaler Inhale 2 puffs into the lungs 2 (two) times daily. 1 each 12   dicyclomine (BENTYL) 20 MG tablet Take 1 tablet (20 mg total) by mouth every 6 (six) hours as needed for spasms (AB pain). 40 tablet 1   EPINEPHrine 0.3 mg/0.3 mL IJ SOAJ injection Inject into  mid outer thigh 1 time for 1 dose. 2 each 0   ibuprofen (ADVIL) 600 MG tablet Take 1 tablet (600 mg total) by mouth a night time as needed. 16 tablet 1   medroxyPROGESTERone (DEPO-PROVERA) 150 MG/ML injection Inject 1 mL (150 mg total) into the muscle every 3 (three) months. 1 mL 4   ondansetron (ZOFRAN-ODT) 4 MG disintegrating tablet Dissolve 1 tablet (4 mg total) by mouth every 8 (eight) hours as needed for nausea. 10 tablet 0   pantoprazole (PROTONIX) 40 MG tablet Take 1 tablet (40 mg total) by mouth daily. 30 tablet 5   promethazine (PHENERGAN) 25 MG tablet Take 0.5 tablets (12.5 mg total) by mouth every 6 (six) hours  as needed for nausea or vomiting. 20 tablet 0   sertraline (ZOLOFT) 100 MG tablet Take 1 tablet (100 mg total) by mouth daily. 30 tablet 0   topiramate (TOPAMAX) 25  MG tablet Take 1 tablet (25 mg total) by mouth 2 (two) times daily. 60 tablet 0   traZODone (DESYREL) 100 MG tablet Take 1 tablet (100 mg total) by mouth every night. 30 tablet 0   prochlorperazine (COMPAZINE) 10 MG tablet Take 1 tablet (10 mg total) by mouth every 8 (eight) hours. (Patient not taking: Reported on 01/20/2023) 9 tablet 0     Musculoskeletal: Strength & Muscle Tone: within normal limits Gait & Station: normal Patient leans: N/A   Psychiatric Specialty Exam: Presentation  General Appearance:  Appropriate for Environment  Eye Contact: Good  Speech: Clear and Coherent  Speech Volume: Normal  Handedness: Right   Mood and Affect  Mood: Hopeless; Depressed  Affect: Appropriate; Depressed   Thought Process  Thought Processes: Coherent  Descriptions of Associations:Intact  Orientation:Full (Time, Place and Person)  Thought Content:WDL  History of Schizophrenia/Schizoaffective disorder:No  Duration of Psychotic Symptoms:No data recorded Hallucinations:Hallucinations: None  Ideas of Reference:None  Suicidal Thoughts:Suicidal Thoughts: Yes, Passive  Homicidal Thoughts:Homicidal Thoughts: No   Sensorium  Memory: Immediate Fair; Recent Fair  Judgment: Poor  Insight: Fair   Chartered certified accountant: Fair  Attention Span: Fair  Recall: [employer] of Knowledge: Fair  Language: Fair   Psychomotor Activity  Psychomotor Activity: Psychomotor Activity: Normal   Assets  Assets: Manufacturing systems engineer; Housing; Desire for Improvement; Social Support    Sleep  Sleep: Sleep: Fair   Physical Exam: Physical Exam Vitals and nursing note reviewed. Exam conducted with a chaperone present.  Neurological:     Mental Status: She is alert.  Psychiatric:        Attention and Perception: Attention normal.        Mood and Affect: Mood is anxious and depressed. Affect is flat.        Speech: Speech normal.         Behavior: Behavior is cooperative.        Thought Content: Thought content includes suicidal ideation.        Cognition and Memory: Memory normal.        Judgment: Judgment is impulsive and inappropriate.    Review of Systems  Constitutional: Negative.   Psychiatric/Behavioral:  Positive for depression and suicidal ideas.    Blood pressure 118/63, pulse (!) 57, temperature 99.1 F (37.3 C), temperature source Oral, resp. rate 16, SpO2 98%. There is no height or weight on file to calculate BMI.   Medical Decision Making: Case consulted with Dr. Lucianne Muss. Patient meets criteria for inpatient psychiatric admission. CSW notified to seek bed psychiatric bed placement.    Disposition: Recommend psychiatric Inpatient admission.   Amy Wall, PMHNP 02/10/2023 11:44 AM

## 2023-02-10 NOTE — ED Notes (Signed)
Ashanti Called to check on patient. Let her know patient was sleeping still.

## 2023-02-10 NOTE — ED Notes (Signed)
Pt dressed into purple scrubs. Pt belongings placed in cabinet 19-22. 1 personal bag labeled. (Shirt, Pants, Shoes)

## 2023-02-10 NOTE — ED Provider Notes (Signed)
[place] EMERGENCY DEPARTMENT AT [facility] Provider Note   CSN: [id number] Arrival date & time: 02/10/23  0348     History  Chief Complaint  Patient presents with   Drug Overdose    Amy Wall is a 17 y.o. female.  17 yo F with a chief complaints of an intentional overdose.  She says that she took random amounts of things that were nearby her.  She did this about an hour ago.  She says she took it to try and decrease her pain.  She has been drinking tonight as well.  She initially denied any over-the-counter medications and then later said that she did have some DayQuil.  EMS had bottles on scene.  Thinks that she took ibuprofen, Compazine, hydroxyzine, Zofran.   Drug Overdose       Home Medications Prior to Admission medications   Medication Sig Start Date End Date Taking? Authorizing Provider  albuterol (VENTOLIN HFA) 108 (90 Base) MCG/ACT inhaler Inhale 2 puffs into the lungs every 6 (six) hours as needed for wheezing or shortness of breath. 07/08/22  Yes Del Newman Nip, Tenna Child, FNP  budesonide-formoterol (SYMBICORT) 80-4.5 MCG/ACT inhaler Inhale 2 puffs into the lungs 2 (two) times daily. 07/08/22  Yes Del Newman Nip, Tenna Child, FNP  dicyclomine (BENTYL) 20 MG tablet Take 1 tablet (20 mg total) by mouth every 6 (six) hours as needed for spasms (AB pain). 01/21/23  Yes Jenel Lucks, MD  EPINEPHrine 0.3 mg/0.3 mL IJ SOAJ injection Inject into  mid outer thigh 1 time for 1 dose. 12/13/22  Yes   ibuprofen (ADVIL) 600 MG tablet Take 1 tablet (600 mg total) by mouth a night time as needed. 01/24/23  Yes   medroxyPROGESTERone (DEPO-PROVERA) 150 MG/ML injection Inject 1 mL (150 mg total) into the muscle every 3 (three) months. 07/29/22  Yes Cyril Mourning A, NP  ondansetron (ZOFRAN-ODT) 4 MG disintegrating tablet Dissolve 1 tablet (4 mg total) by mouth every 8 (eight) hours as needed for nausea. 12/17/22  Yes Garlon Hatchet, PA-C  pantoprazole (PROTONIX) 40  MG tablet Take 1 tablet (40 mg total) by mouth daily. 12/27/22  Yes Meredith Pel, NP  promethazine (PHENERGAN) 25 MG tablet Take 0.5 tablets (12.5 mg total) by mouth every 6 (six) hours as needed for nausea or vomiting. 01/21/23  Yes Jenel Lucks, MD  sertraline (ZOLOFT) 100 MG tablet Take 1 tablet (100 mg total) by mouth daily. 12/11/22  Yes   topiramate (TOPAMAX) 25 MG tablet Take 1 tablet (25 mg total) by mouth 2 (two) times daily. 12/11/22  Yes   traZODone (DESYREL) 100 MG tablet Take 1 tablet (100 mg total) by mouth every night. 12/11/22  Yes   prochlorperazine (COMPAZINE) 10 MG tablet Take 1 tablet (10 mg total) by mouth every 8 (eight) hours. Patient not taking: Reported on 01/20/2023 01/08/23   Arabella Merles, PA-C      Allergies    Peanut-containing drug products, Pineapple, Shellfish allergy, Strawberry extract, Charentais melon (french melon), and Chocolate    Review of Systems   Review of Systems  Physical Exam Updated Vital Signs BP (!) 146/97 (BP Location: Left Arm) Comment: Simultaneous filing. User may not have seen previous data.  Pulse 83 Comment: Simultaneous filing. User may not have seen previous data.  Temp 98.7 F (37.1 C) (Oral) Comment: Simultaneous filing. User may not have seen previous data. Comment (Src): Simultaneous filing. User may not have seen previous data.  Resp 16 Comment:  Simultaneous filing. User may not have seen previous data.  SpO2 100% Comment: Simultaneous filing. User may not have seen previous data. Physical Exam Vitals and nursing note reviewed.  Constitutional:      General: She is not in acute distress.    Appearance: She is well-developed. She is not diaphoretic.  HENT:     Head: Normocephalic and atraumatic.  Eyes:     Pupils: Pupils are equal, round, and reactive to light.  Cardiovascular:     Rate and Rhythm: Normal rate and regular rhythm.     Heart sounds: No murmur heard.    No friction rub. No gallop.  Pulmonary:      Effort: Pulmonary effort is normal.     Breath sounds: No wheezing or rales.  Abdominal:     General: There is no distension.     Palpations: Abdomen is soft.     Tenderness: There is no abdominal tenderness.  Musculoskeletal:        General: No tenderness.     Cervical back: Normal range of motion and neck supple.  Skin:    General: Skin is warm and dry.  Neurological:     Mental Status: She is alert and oriented to person, place, and time.  Psychiatric:        Behavior: Behavior normal.     ED Results / Procedures / Treatments   Labs (all labs ordered are listed, but only abnormal results are displayed) Labs Reviewed  COMPREHENSIVE METABOLIC PANEL - Abnormal; Notable for the following components:      Result Value   Potassium 3.2 (*)    CO2 21 (*)    Glucose, Bld 102 (*)    Creatinine, Ser 0.42 (*)    Albumin 3.4 (*)    Total Bilirubin 0.2 (*)    All other components within normal limits  CBC WITH DIFFERENTIAL/PLATELET - Abnormal; Notable for the following components:   Hemoglobin 11.2 (*)    HCT 35.0 (*)    Platelets 428 (*)    All other components within normal limits  SALICYLATE LEVEL - Abnormal; Notable for the following components:   Salicylate Lvl <7.0 (*)    All other components within normal limits  ACETAMINOPHEN LEVEL - Abnormal; Notable for the following components:   Acetaminophen (Tylenol), Serum <10 (*)    All other components within normal limits  ETHANOL  RAPID URINE DRUG SCREEN, HOSP PERFORMED  HCG, SERUM, QUALITATIVE  ACETAMINOPHEN LEVEL    EKG EKG Interpretation Date/Time:  Thursday February 10 2023 03:55:09 EDT Ventricular Rate:  89 PR Interval:  135 QRS Duration:  98 QT Interval:  381 QTC Calculation: 464 R Axis:   12  Text Interpretation: Sinus rhythm Probable left ventricular hypertrophy No significant change since last tracing Confirmed by Melene Plan [phone]) on 02/10/2023 4:30:13 AM  Radiology No results  found.  Procedures Procedures    Medications Ordered in ED Medications - No data to display  ED Course/ Medical Decision Making/ A&P                                 Medical Decision Making Amount and/or Complexity of Data Reviewed Labs: ordered.   17 yo F with a chief complaints of an intentional overdose.  I says that she took these earlier this evening.  She had then sent a text message about 2:45 AM to one of her friends saying that she thinks she  took too much and that this might be the end.  Per EMS they think based on the bottles that were there that she took ibuprofen, Zofran, Compazine, and hydroxyzine.  They also saw small amount of DayQuil missing.  She had been drinking 4 Loco's.  Smokes marijuana.  With control recommends a 6-hour ekg.  4-hour Tylenol.  Supportive care otherwise.  Signed out to Dr. Criss Alvine, please see their notes for further detail of care in the ED.   The patients results and plan were reviewed and discussed.   Any x-rays performed were independently reviewed by myself.   Differential diagnosis were considered with the presenting HPI.  Medications - No data to display  Vitals:   02/10/23 0356  BP: (!) 146/97  Pulse: 83  Resp: 16  Temp: 98.7 F (37.1 C)  TempSrc: Oral  SpO2: 100%    Final diagnoses:  Intentional drug overdose, initial encounter [facility])           Final Clinical Impression(s) / ED Diagnoses Final diagnoses:  Intentional drug overdose, initial encounter [facility])    Rx / DC Orders ED Discharge Orders     None         Melene Plan, DO 02/10/23 0710

## 2023-02-10 NOTE — ED Notes (Signed)
[facility] called to get information about placement for this patient.

## 2023-02-10 NOTE — ED Notes (Addendum)
Legal guardian [name] [phone] notified.

## 2023-02-11 ENCOUNTER — Other Ambulatory Visit (HOSPITAL_COMMUNITY): Payer: Self-pay

## 2023-02-11 MED ORDER — HYDROXYZINE HCL 25 MG PO TABS: 25.0000 mg | ORAL_TABLET | Freq: Three times a day (TID) | ORAL | Status: DC | PRN

## 2023-02-11 MED ORDER — CHLORHEXIDINE GLUCONATE 0.12 % MT SOLN
15.0000 mL | Freq: Two times a day (BID) | OROMUCOSAL | 1 refills | Status: DC
  Filled 2023-02-11: qty 473, 16d supply, fill #0

## 2023-02-11 NOTE — ED Notes (Signed)
[facility] called pts Amy Wall (legal guardian) to see if there is plan in place for pt when she is discharged. The guardian is mostly working on setting medical appointments for pt. Pts legal guardian is looking into a possible day program for pt to have activities during the week. Pt has an ACT team ([facility]) who is currently coordinating services including therapy (Team Lead: Abby [phone]. Legal guardian believes pt is safe to return back to the hotel where she is currently living.   [facility] called the team leader (Abby) of pts ACT team and left a message to return the call.   [name], [facility]  02/11/23

## 2023-02-11 NOTE — Discharge Instructions (Addendum)
1.  You can use the Peridex rinse to help clean your teeth especially around the wisdom tooth area.  Make sure this area stays very clean.  Floss and use a toothbrush.  You can also use a Waterpik to keep this clean.  This will help avoid infection until you can get dental extractions.  Discharge recommendations:  Patient is to take medications as prescribed. Please see information for follow-up appointment with psychiatry and therapy. Please follow up with your primary care provider for all medical related needs.   Therapy: We recommend that patient participate in individual therapy to address mental health concerns.  Medications: The patient or guardian is to contact a medical professional and/or outpatient provider to address any new side effects that develop. The patient or guardian should update outpatient providers of any new medications and/or medication changes.   Atypical antipsychotics: If you are prescribed an atypical antipsychotic, it is recommended that your height, weight, BMI, blood pressure, fasting lipid panel, and fasting blood sugar be monitored by your outpatient providers.  Safety:  The patient should abstain from use of illicit substances/drugs and abuse of any medications. If symptoms worsen or do not continue to improve or if the patient becomes actively suicidal or homicidal then it is recommended that the patient return to the closest hospital emergency department, the [facility], or call 911 for further evaluation and treatment. National Suicide Prevention Lifeline 1-800-SUICIDE or (316)763-0296.  About 988 988 offers 24/7 access to trained crisis counselors who can help people experiencing mental health-related distress. People can call or text 988 or chat 988lifeline.org for themselves or if they are worried about a loved one who may need crisis support.  Crisis Mobile: [facility]:                     763-074-8462 (for  crisis response 24 hours a day) [facility] Hotline:                                            (720) 638-4528    Safety Plan SHEWANDA SHARPE will reach out to her legal guardian [name], call 911 or call mobile crisis, or go to nearest emergency room if condition worsens or if suicidal thoughts become active Patients' will follow up with  [facility] for outpatient psychiatric services (therapy/medication management).  The suicide prevention education provided includes the following: Suicide risk factors Suicide prevention and interventions National Suicide Hotline telephone number [facility] assessment telephone number [facility] Emergency Assistance 911 [facility] and/or Residential Mobile Crisis Unit telephone number Request made of family/significant other to:  her legal guardian [name] Remove weapons (e.g., guns, rifles, knives), all items previously/currently identified as safety concern.   Remove drugs/medications (over the counter, prescriptions, illicit drugs), all items previously/currently identified as a safety concern.

## 2023-02-11 NOTE — ED Provider Notes (Signed)
Emergency Medicine Observation Re-evaluation Note  Amy Wall is a 17 y.o. female, seen on rounds today.  Pt initially presented to the ED for complaints of Drug Overdose Currently, the patient is awaiting psychiatric disposition planning by [facility].  Patient reports she has some problems with dental pain around her wisdom tooth on the right.  She reports she is set up to get treatment on outpatient basis with dentistry.  She however would like me to look at it.  No other acute complaints.  Physical Exam  BP (!) 104/56 (BP Location: Right Arm)   Pulse (!) 56   Temp 98.7 F (37.1 C) (Oral)   Resp 16   SpO2 100%  Physical Exam General: Well in appearance up and ambulatory no acute distress ENT: No facial swelling.  Normal range of motion of the jaw.  General visual inspection of the dentition does not show any areas of significant abscess or gingival swelling. Psych: Alert and pleasantly cooperative  ED Course / MDM  EKG:EKG Interpretation Date/Time:  Thursday February 10 2023 08:58:43 EDT Ventricular Rate:  55 PR Interval:  123 QRS Duration:  98 QT Interval:  454 QTC Calculation: 435 R Axis:   22  Text Interpretation: Sinus rhythm Abnormal T, consider ischemia, anterior leads rate is slower compared to earlier in the day Confirmed by Pricilla Loveless [phone]) on 02/10/2023 9:04:25 AM  I have reviewed the labs performed to date as well as medications administered while in observation.  Recent changes in the last 24 hours include none.  Plan  Current plan is for disposition planning per psychiatric counselors and social work.  Patient has outpatient follow-up for dental concerns.  At this time no appearance of active apical abscess or intraoral lesions.  Counseled the patient on using swishes and careful cleaning of the gums around the wisdom teeth area.  Patient voices understanding.    Arby Barrette, MD 02/11/23 [phone]

## 2023-02-11 NOTE — Progress Notes (Signed)
Pt has been accepted to [facility] 02/12/2023, pending voluntary consent faxed to [phone]. Bed assignment: Main campus  Pt meets inpatient criteria per [name], PMHNP  Attending Physician will be Loni Beckwith, MD  Report can be called to: [phone] (this is a pager, please leave call-back number when giving report)  Pt can arrive after 8 AM  Care Team Notified: [name], PMHNP, [name], NT, and Latricia Heft, Paramedic  Cathie Beams, MSW, LCSW  02/11/2023 1:51 PM

## 2023-02-11 NOTE — Progress Notes (Addendum)
ADDENDUM  3:26 PM - CSW spoke with pt's legal guardian, [name] [phone], regarding pt's acceptance to [facility] for tomorrow. LG shared concerns with pt going to inpatient psych treatment. LG reports she believes pt did not intentionally overdose as SI attempt, but due to physical pain she's in from dental/mouth and GI issues. LG explained that pt has upcoming appointments with dentist, oral surgery, and GI specialist. CSW informed LG she will relay information to NP. NP notified. NP to follow up with pt's LG.  2:05 PM - CSW attempted to speak with pt's legal guardian, [name] [phone], to inform her of pt's acceptance to [facility] for tomorrow. CSW was unable to speak with LG and unable to leave voicemail. CSW sent secure email with consents to LG, with instructions to complete and return to CSW. CSW will await follow up.  Cathie Beams, MSW, LCSW  02/11/2023 2:16 PM

## 2023-02-11 NOTE — Progress Notes (Signed)
LCSW Progress Note  [id number]   Amy Wall  02/11/2023  1:18 PM  Description:   Inpatient Psychiatric Referral  Patient was recommended inpatient per [facility], PMHNP. There are no available beds at [facility], per [facility] Tillman Abide, RN. Patient was referred to the following out of network facilities:   Production assistant, radio Address Phone Fax  [facility]  [address] [place] [address] [phone]  [facility]  [address] [place], [place] [address] [phone]  [facility]  [address], [place] [address] [phone]  [facility]  [address] [place] [address] [phone]  [facility] [address]  [address], [place] [address] [phone]  [facility]  [address] [place], [place] [address] [phone]  [facility]  [address] [place] [address] [phone]  [facility]  [address] [place] [address] [phone]  [facility]  [address] [place] [address] [phone]  [facility] EFAX  [address] [place], [place] [phone]  [facility]  [place] [phone]  [facility]  [address] [place], [place] [address] [phone]  [facility]  [address] [place] [address] [phone]  [facility]  [address] [place] [address] [phone]  [facility]  [address] [place] [address] [phone]  [facility]  [address]  [place] [address] [phone]  [facility]  [address] [place], [place] [address] [phone]  [facility]  [address], [place] [address] [phone]  [facility]  [address], [place] [address] [phone]    Situation ongoing, CSW to continue following and update chart as more information becomes available.    Cathie Beams, MSW, LCSW  02/11/2023 1:18 PM

## 2023-02-11 NOTE — ED Notes (Signed)
[facility] received a call back from the team leader ([phone]) of pts ACT team. Deloria Lair has been working with pt for a few weeks and has met with her twice. Abby feels that pt is safe to return to her hotel and that her self injurious behaviors are often pt seeking attention.   Abby said that pt has been invited to group activities. Pt attended one group but said that she did not want to hear other peoples problems. Abby is looking for a more suitable housing situation for pt that provides more support. Abby said that if pt is discharged she will arrange to have her picked up if possible or pt would need a taxi voucher to return to her hotel. Abby said that she would also have someone from the team follow up with a phone check in with pt and arrange for a visit soon thereafter. If pt is discharged on a weekend or after hours we can contact the ACT crisis team at [phone] to notify them of pts discharge.  [name], [facility]  02/11/23

## 2023-02-11 NOTE — Progress Notes (Addendum)
[facility] Psych ED Progress Note  02/11/2023 6:00 PM Amy Wall  MRN:  [id number]   Subjective:   Principal Problem: MDD (major depressive disorder), recurrent severe, without psychosis (HCC) Diagnosis:  Principal Problem:   MDD (major depressive disorder), recurrent severe, without psychosis (HCC) Active Problems:   Suicidal behavior with attempted self-injury [facility])   ED Assessment Time Calculation: Start Time: 1100 Stop Time: 1120 Total Time in Minutes (Assessment Completion): 20   Subjective: On evaluation today, the patient is sitting on the side of her bed.  She is calm and cooperative during this assessment. Her appearance is appropriate for environment. Her eye contact is good.  Speech is clear and coherent, normal pace and normal volume. She is alert and oriented x3 to person, place, time, and situation. She reports her mood is "fine".  Affect is congruent with mood.  Thought process is coherent.  Thought content is within normal limits.  She denies auditory and visual hallucinations.  No indication that she is responding to internal stimuli during this assessment.  No delusions elicited during this assessment.  She denies suicidal ideations.  She denies homicidal ideations. Appetite is fair and sleep is poor, she states she is ready to be discharged.  She feels that going to an inpatient facility will only make her mood worse, says that she was not deliberately trying to overdose on Tylenol, states that she has been in mouth pain due to needing a root canal.  She states that she has been to inpatient facility several times and feels that they did not help her cope with her issues.  She does say that she will reach out more to her legal guardian and work on trying to make her so productive throughout the day by attending groups provided by her legal guardian and classes.  She also states that she is bored living in [facility], but states she has been talking with her legal guardian and  says that her legal guardian will help her try moved to a hotel in [place] where she can be closer to friends, and have a better support system.  Spoke with patient's legal guardian Amy Wall, she states that she does not think that this was a suicide attempt, more so because patient is in physical pain.  She feels that patient does not need to be admitted to an inpatient psychiatric facility.  She states that she has been monitoring patient, Wall up doctor's appointments for patient due to her dental and GI issues.  She states that patient has to get a root canal, and needs a wisdom tooth taken out.  She states patient also has been having some stomach issues, where she is having constant vomiting and diarrhea and she states that she set up a GI appointment on February 23, 2023, and patient will have surgery on March 16, 2023.  She states patient does have an ACT team, which visits patient 1-2 times a week, she states also that she is helping patient trying to find positive things to do in the community.   [facility] received a call back from the team leader ([phone]) of pts ACT team. Deloria Lair has been working with pt for a few weeks and has met with her twice. Abby feels that pt is safe to return to her hotel and that her self injurious behaviors are often pt seeking attention.    Abby said that pt has been invited to group activities. Pt attended one group but said that she did  not want to hear other peoples problems. Abby is looking for a more suitable housing situation for pt that provides more support. Abby said that if pt is discharged she will arrange to have her picked up if possible or pt would need a taxi voucher to return to her hotel. Abby said that she would also have someone from the team follow up with a phone check in with pt and arrange for a visit soon thereafter. If pt is discharged on a weekend or after hours we can contact the ACT crisis team at [phone] to notify them of  pts discharge.    Past Psychiatric History:  MDD With psychosis, DMDD (disruptive mood dysregulation disorder), cannabis use disorder, oppositional defiant disorder, PTSD, anxiety and SI.  Patient reports schizophrenia diagnosis   [place] Scale:  Flowsheet Row ED from 02/10/2023 in [facility] Emergency Department at [facility] Most recent reading at 02/10/2023 11:40 AM ED from 01/08/2023 in [facility] Emergency Department at [facility] Most recent reading at 01/08/2023  8:52 PM ED from 01/08/2023 in [facility] Emergency Department at [facility] Most recent reading at 01/08/2023  4:47 AM  C-SSRS RISK CATEGORY No Risk No Risk No Risk       Past Medical History:  Past Medical History:  Diagnosis Date   Anxiety    Asthma    DMDD (disruptive mood dysregulation disorder) (HCC)    Epilepsy (HCC)    pseuodoseizures per chart   HTN (hypertension)    Major depressive disorder    Oppositional defiant disorder    PTSD (post-traumatic stress disorder)    Schizophrenia (HCC)    Seizures (HCC)     Past Surgical History:  Procedure Laterality Date   BACK SURGERY     CHOLECYSTECTOMY, LAPAROSCOPIC     Family History:  Family History  Family history unknown: Yes    Social History:  Social History   Substance and Sexual Activity  Alcohol Use Not Currently   Alcohol/week: 4.0 standard drinks of alcohol   Types: 2 Glasses of wine, 2 Shots of liquor per week     Social History   Substance and Sexual Activity  Drug Use Yes   Types: Marijuana, Methamphetamines   Comment: daily    Social History   Socioeconomic History   Marital status: Single    Spouse name: Not on file   Number of children: 0   Years of education: Not on file   Highest education level: Not on file  Occupational History   Not on file  Tobacco Use   Smoking status: Every Day    Types: Cigars    Passive exposure: Yes   Smokeless tobacco: Never   Tobacco comments:    Lack and  mild, marijuana  Vaping Use   Vaping status: Every Day  Substance and Sexual Activity   Alcohol use: Not Currently    Alcohol/week: 4.0 standard drinks of alcohol    Types: 2 Glasses of wine, 2 Shots of liquor per week   Drug use: Yes    Types: Marijuana, Methamphetamines    Comment: daily   Sexual activity: Yes    Birth control/protection: Condom  Other Topics Concern   Not on file  Social History Narrative   She lives with her uncle and aunt.   She has six siblings.   Social Determinants of Health   Financial Resource Strain: Medium Risk (06/28/2022)   Overall Financial Resource Strain (CARDIA)    Difficulty of Paying  Living Expenses: Somewhat hard  Food Insecurity: No Food Insecurity (09/23/2022)   Hunger Vital Sign    Worried About Running Out of Food in the Last Year: Never true    Ran Out of Food in the Last Year: Never true  Transportation Needs: No Transportation Needs (09/23/2022)   PRAPARE - Administrator, Civil Service (Medical): No    Lack of Transportation (Non-Medical): No  Physical Activity: Inactive (06/28/2022)   Exercise Vital Sign    Days of Exercise per Week: 0 days    Minutes of Exercise per Session: 0 min  Stress: No Stress Concern Present (06/28/2022)   [employer] of Occupational Health - Occupational Stress Questionnaire    Feeling of Stress : Only a little  Social Connections: Moderately Isolated (06/28/2022)   Social Connection and Isolation Panel [NHANES]    Frequency of Communication with Friends and Family: Once a week    Frequency of Social Gatherings with Friends and Family: Once a week    Attends Religious Services: More than 4 times per year    Active Member of [employer] or Organizations: Yes    Attends Banker Meetings: 1 to 4 times per year    Marital Status: Never married    Sleep: Fair  Appetite:  Fair  Current Medications: Current Facility-Administered Medications  Medication Dose Route Frequency  Provider Last Rate Last Admin   acetaminophen (TYLENOL) tablet 650 mg  650 mg Oral Q4H PRN Pricilla Loveless, MD   650 mg at 02/11/23 1233   albuterol (VENTOLIN HFA) 108 (90 Base) MCG/ACT inhaler 2 puff  2 puff Inhalation Q6H PRN Pricilla Loveless, MD       alum & mag hydroxide-simeth (MAALOX/MYLANTA) 200-200-20 MG/5ML suspension 30 mL  30 mL Oral Q6H PRN Pricilla Loveless, MD       dicyclomine (BENTYL) tablet 20 mg  20 mg Oral Q6H PRN Pricilla Loveless, MD       hydrOXYzine (ATARAX) tablet 25 mg  25 mg Oral TID PRN Motley-Mangrum, Sheneika Walstad A, PMHNP       mometasone-formoterol (DULERA) 100-5 MCG/ACT inhaler 2 puff  2 puff Inhalation BID PRN Pricilla Loveless, MD       ondansetron (ZOFRAN) tablet 4 mg  4 mg Oral Q8H PRN Pricilla Loveless, MD       pantoprazole (PROTONIX) EC tablet 40 mg  40 mg Oral Daily Pricilla Loveless, MD   40 mg at 02/11/23 1142   promethazine (PHENERGAN) tablet 12.5 mg  12.5 mg Oral Q6H PRN Pricilla Loveless, MD       sertraline (ZOLOFT) tablet 100 mg  100 mg Oral Daily Pricilla Loveless, MD   100 mg at 02/11/23 1141   topiramate (TOPAMAX) tablet 25 mg  25 mg Oral BID Pricilla Loveless, MD   25 mg at 02/11/23 1141   traZODone (DESYREL) tablet 100 mg  100 mg Oral QHS Pricilla Loveless, MD   100 mg at 02/10/23 2244   Current Outpatient Medications  Medication Sig Dispense Refill   albuterol (VENTOLIN HFA) 108 (90 Base) MCG/ACT inhaler Inhale 2 puffs into the lungs every 6 (six) hours as needed for wheezing or shortness of breath. 8 g 2   budesonide-formoterol (SYMBICORT) 80-4.5 MCG/ACT inhaler Inhale 2 puffs into the lungs 2 (two) times daily. 1 each 12   chlorhexidine (PERIDEX) 0.12 % solution Use as directed 15 mLs in the mouth or throat 2 (two) times daily. 473 mL 1   dicyclomine (BENTYL) 20 MG tablet Take 1 tablet (  20 mg total) by mouth every 6 (six) hours as needed for spasms (AB pain). 40 tablet 1   EPINEPHrine 0.3 mg/0.3 mL IJ SOAJ injection Inject into  mid outer thigh 1 time for 1 dose. 2  each 0   ibuprofen (ADVIL) 600 MG tablet Take 1 tablet (600 mg total) by mouth a night time as needed. 16 tablet 1   medroxyPROGESTERone (DEPO-PROVERA) 150 MG/ML injection Inject 1 mL (150 mg total) into the muscle every 3 (three) months. 1 mL 4   ondansetron (ZOFRAN-ODT) 4 MG disintegrating tablet Dissolve 1 tablet (4 mg total) by mouth every 8 (eight) hours as needed for nausea. 10 tablet 0   pantoprazole (PROTONIX) 40 MG tablet Take 1 tablet (40 mg total) by mouth daily. 30 tablet 5   promethazine (PHENERGAN) 25 MG tablet Take 0.5 tablets (12.5 mg total) by mouth every 6 (six) hours as needed for nausea or vomiting. 20 tablet 0   sertraline (ZOLOFT) 100 MG tablet Take 1 tablet (100 mg total) by mouth daily. 30 tablet 0   topiramate (TOPAMAX) 25 MG tablet Take 1 tablet (25 mg total) by mouth 2 (two) times daily. 60 tablet 0   traZODone (DESYREL) 100 MG tablet Take 1 tablet (100 mg total) by mouth every night. 30 tablet 0   prochlorperazine (COMPAZINE) 10 MG tablet Take 1 tablet (10 mg total) by mouth every 8 (eight) hours. (Patient not taking: Reported on 01/20/2023) 9 tablet 0    Lab Results:  Results for orders placed or performed during the hospital encounter of 02/10/23 (from the past 48 hour(s))  Comprehensive metabolic panel     Status: Abnormal   Collection Time: 02/10/23  4:00 AM  Result Value Ref Range   Sodium 137 135 - 145 mmol/L   Potassium 3.2 (L) 3.5 - 5.1 mmol/L   Chloride 104 98 - 111 mmol/L   CO2 21 (L) 22 - 32 mmol/L   Glucose, Bld 102 (H) 70 - 99 mg/dL    Comment: Glucose reference range applies only to samples taken after fasting for at least 8 hours.   BUN 9 6 - 20 mg/dL   Creatinine, Ser 1.61 (L) 0.44 - 1.00 mg/dL   Calcium 8.9 8.9 - 09.6 mg/dL   Total Protein 7.1 6.5 - 8.1 g/dL   Albumin 3.4 (L) 3.5 - 5.0 g/dL   AST 18 15 - 41 U/L   ALT 15 0 - 44 U/L   Alkaline Phosphatase 81 38 - 126 U/L   Total Bilirubin 0.2 (L) 0.3 - 1.2 mg/dL   GFR, Estimated >04 >54 mL/min     Comment: (NOTE) Calculated using the CKD-EPI Creatinine Equation (2021)    Anion gap 12 5 - 15    Comment: Performed at [facility], [address] [place], [place] [address]  Ethanol     Status: None   Collection Time: 02/10/23  4:00 AM  Result Value Ref Range   Alcohol, Ethyl (B) <10 <10 mg/dL    Comment: (NOTE) Lowest detectable limit for serum alcohol is 10 mg/dL.  For medical purposes only. Performed at [facility], [address], [place], [place] [address]   CBC with Diff     Status: Abnormal   Collection Time: 02/10/23  4:00 AM  Result Value Ref Range   WBC 10.2 4.0 - 10.5 K/uL   RBC 3.96 3.87 - 5.11 MIL/uL   Hemoglobin 11.2 (L) 12.0 - 15.0 g/dL   HCT 29.5 (L)  36.0 - 46.0 %   MCV 88.4 80.0 - 100.0 fL   MCH 28.3 26.0 - 34.0 pg   MCHC 32.0 30.0 - 36.0 g/dL   RDW 82.9 56.2 - 13.0 %   Platelets 428 (H) 150 - 400 K/uL   nRBC 0.0 0.0 - 0.2 %   Neutrophils Relative % 60 %   Neutro Abs 6.1 1.7 - 7.7 K/uL   Lymphocytes Relative 31 %   Lymphs Abs 3.2 0.7 - 4.0 K/uL   Monocytes Relative 8 %   Monocytes Absolute 0.8 0.1 - 1.0 K/uL   Eosinophils Relative 1 %   Eosinophils Absolute 0.1 0.0 - 0.5 K/uL   Basophils Relative 0 %   Basophils Absolute 0.0 0.0 - 0.1 K/uL   Immature Granulocytes 0 %   Abs Immature Granulocytes 0.03 0.00 - 0.07 K/uL    Comment: Performed at [facility], [address], [place], [place] [address]  hCG, serum, qualitative     Status: None   Collection Time: 02/10/23  4:00 AM  Result Value Ref Range   Preg, Serum NEGATIVE NEGATIVE    Comment:        THE SENSITIVITY OF THIS METHODOLOGY IS >10 mIU/mL. Performed at [facility], [address] [place], [place] [address]   Salicylate level     Status: Abnormal   Collection Time: 02/10/23  4:00 AM  Result Value Ref Range   Salicylate Lvl <7.0 (L) 7.0 - 30.0 mg/dL    Comment: Performed at [facility], [address] [place], [place] [address]  Acetaminophen level     Status: Abnormal   Collection Time: 02/10/23  4:00 AM  Result Value Ref Range   Acetaminophen (Tylenol), Serum <10 (L) 10 - 30 ug/mL    Comment: (NOTE) Therapeutic concentrations vary significantly. A range of 10-30 ug/mL  may be an effective concentration for many patients. However, some  are best treated at concentrations outside of this range. Acetaminophen concentrations >150 ug/mL at 4 hours after ingestion  and >50 ug/mL at 12 hours after ingestion are often associated with  toxic reactions.  Performed at [facility], [address], [place], [place] [address]   Urine rapid drug screen (hosp performed)     Status: None   Collection Time: 02/10/23  6:32 AM  Result Value Ref Range   Opiates NONE DETECTED NONE DETECTED   Cocaine NONE DETECTED NONE DETECTED   Benzodiazepines NONE DETECTED NONE DETECTED   Amphetamines NONE DETECTED NONE DETECTED   Tetrahydrocannabinol NONE DETECTED NONE DETECTED   Barbiturates NONE DETECTED NONE DETECTED    Comment: (NOTE) DRUG SCREEN FOR MEDICAL PURPOSES ONLY.  IF CONFIRMATION IS NEEDED FOR ANY PURPOSE, NOTIFY LAB WITHIN 5 DAYS.  LOWEST DETECTABLE LIMITS FOR URINE DRUG SCREEN Drug Class                     Cutoff (ng/mL) Amphetamine and metabolites    1000 Barbiturate and metabolites    200 Benzodiazepine                 200 Opiates and metabolites        300 Cocaine and metabolites        300 THC                            50 Performed at [facility], [address] [place],  [place] [address]   Acetaminophen level     Status: None   Collection Time: 02/10/23  7:30 AM  Result Value Ref Range   Acetaminophen (Tylenol), Serum 11 10 - 30 ug/mL    Comment: (NOTE) Therapeutic concentrations vary significantly. A range of 10-30 ug/mL  may be an effective concentration for many patients. However, some  are best treated  at concentrations outside of this range. Acetaminophen concentrations >150 ug/mL at 4 hours after ingestion  and >50 ug/mL at 12 hours after ingestion are often associated with  toxic reactions.  Performed at [facility], [address], [place], [place] [address]     Blood Alcohol level:  Lab Results  Component Value Date   [facility] <10 02/10/2023   ETH <10 09/21/2022    Physical Findings:  CIWA:    COWS:     Musculoskeletal: Strength & Muscle Tone: within normal limits Gait & Station: normal Patient leans: N/A  Psychiatric Specialty Exam:  Presentation  General Appearance:  Appropriate for Environment  Eye Contact: Fleeting  Speech: Clear and Coherent  Speech Volume: Normal  Handedness: Right   Mood and Affect  Mood: Anxious; Irritable  Affect: Appropriate   Thought Process  Thought Processes: Coherent  Descriptions of Associations:Intact  Orientation:Full (Time, Place and Person)  Thought Content:Logical; WDL  History of Schizophrenia/Schizoaffective disorder:No  Duration of Psychotic Symptoms:No data recorded Hallucinations:Hallucinations: None  Ideas of Reference:None  Suicidal Thoughts:Suicidal Thoughts: No  Homicidal Thoughts:Homicidal Thoughts: No   Sensorium  Memory: Immediate Fair; Recent Fair  Judgment: Poor  Insight: Fair   Chartered certified accountant: Fair  Attention Span: Fair  Recall: [employer] of Knowledge: Fair  Language: Fair   Psychomotor Activity  Psychomotor Activity: Psychomotor Activity: Normal   Assets  Assets: Communication Skills; Desire for Improvement; Housing; Social Support; Resilience   Sleep  Sleep: Sleep: Good    Physical Exam: Physical Exam Vitals and nursing note reviewed. Exam conducted with a chaperone present.  Neurological:     Mental Status: She is alert.  Psychiatric:        Attention and Perception: Attention normal.        Mood  and Affect: Mood normal.        Speech: Speech normal.        Behavior: Behavior is cooperative.        Thought Content: Thought content normal.        Cognition and Memory: Cognition normal.    Review of Systems  Constitutional: Negative.   Psychiatric/Behavioral:  Positive for depression.    Blood pressure (!) 136/102, pulse 100, temperature 98.7 F (37.1 C), temperature source Oral, resp. rate 18, SpO2 99%. There is no height or weight on file to calculate BMI.   Medical Decision Making: Pt is recommended for overnight observation; with possible discharge in the morning, as patient is currently not presenting as a danger to self or anyone else and her legal guardian [name] does not feel she is a danger to self or anyone else. Will see if OBS has any space for observation.   @1840  OBS NP will review for tonight.    Maylee Bare MOTLEY-MANGRUM, PMHNP 02/11/2023, 6:00 PM

## 2023-02-11 NOTE — ED Notes (Signed)
Patient having difficulty eating solid food due to cavities.  Soft diet ordered.

## 2023-02-12 ENCOUNTER — Other Ambulatory Visit (HOSPITAL_COMMUNITY): Payer: Self-pay

## 2023-02-12 DIAGNOSIS — F332 Major depressive disorder, recurrent severe without psychotic features: Secondary | ICD-10-CM

## 2023-02-12 MED ORDER — HYDROXYZINE HCL 25 MG PO TABS
25.0000 mg | ORAL_TABLET | Freq: Three times a day (TID) | ORAL | 0 refills | Status: DC | PRN
  Filled 2023-02-12: qty 30, 10d supply, fill #0

## 2023-02-12 MED ORDER — TRAZODONE HCL 100 MG PO TABS
100.0000 mg | ORAL_TABLET | Freq: Every evening | ORAL | 0 refills | Status: DC
Start: 2023-02-12 — End: 2023-11-09
  Filled 2023-02-12: qty 30, 30d supply, fill #0

## 2023-02-12 MED ORDER — SERTRALINE HCL 100 MG PO TABS
100.0000 mg | ORAL_TABLET | Freq: Every day | ORAL | 0 refills | Status: DC
  Filled 2023-02-12: qty 30, 30d supply, fill #0

## 2023-02-12 NOTE — ED Provider Notes (Signed)
Emergency Medicine Observation Re-evaluation Note  Amy Wall is a 17 y.o. female, seen on rounds today.  Pt initially presented to the ED for complaints of Drug Overdose Currently, the patient is nothing.  Physical Exam  BP (!) 113/54 (BP Location: Right Arm)   Pulse 67   Temp 98.7 F (37.1 C) (Oral)   Resp 15   SpO2 100%  Physical Exam   ED Course / MDM  EKG:EKG Interpretation Date/Time:  Thursday February 10 2023 08:58:43 EDT Ventricular Rate:  55 PR Interval:  123 QRS Duration:  98 QT Interval:  454 QTC Calculation: 435 R Axis:   22  Text Interpretation: Sinus rhythm Abnormal T, consider ischemia, anterior leads rate is slower compared to earlier in the day Confirmed by Pricilla Loveless [phone]) on 02/10/2023 9:04:25 AM  I have reviewed the labs performed to date as well as medications administered while in observation.  Recent changes in the last 24 hours include nothijng.  Plan  Current plan is for cleared by psychiatry team.  ACT team is aware.  Social worker to get in touch with her guardian for pickup.Lockie Mola, [name], DO 02/12/23 1147

## 2023-02-12 NOTE — Discharge Summary (Signed)
[facility] ED Discharge  02/12/2023 12:08 PM Amy Wall  MRN:  [id number]  Principal Problem: MDD (major depressive disorder), recurrent severe, without psychosis (HCC) Discharge Diagnoses: Principal Problem:   MDD (major depressive disorder), recurrent severe, without psychosis (HCC) Active Problems:   Suicidal behavior with attempted self-injury (HCC)  Clinical Impression:  Final diagnoses:  Intentional drug overdose, initial encounter (HCC)   Subjective: ***  ED Assessment Time Calculation: Start Time: 0945 Stop Time: 1000 Total Time in Minutes (Assessment Completion): 15   Past Psychiatric History: ***  Past Medical History:  Past Medical History:  Diagnosis Date   Anxiety    Asthma    DMDD (disruptive mood dysregulation disorder) (HCC)    Epilepsy (HCC)    pseuodoseizures per chart   HTN (hypertension)    Major depressive disorder    Oppositional defiant disorder    PTSD (post-traumatic stress disorder)    Schizophrenia (HCC)    Seizures (HCC)     Past Surgical History:  Procedure Laterality Date   BACK SURGERY     CHOLECYSTECTOMY, LAPAROSCOPIC     Family History:  Family History  Family history unknown: Yes   Family Psychiatric  History: *** Social History:  Social History   Substance and Sexual Activity  Alcohol Use Not Currently   Alcohol/week: 4.0 standard drinks of alcohol   Types: 2 Glasses of wine, 2 Shots of liquor per week     Social History   Substance and Sexual Activity  Drug Use Yes   Types: Marijuana, Methamphetamines   Comment: daily    Social History   Socioeconomic History   Marital status: Single    Spouse name: Not on file   Number of children: 0   Years of education: Not on file   Highest education level: Not on file  Occupational History   Not on file  Tobacco Use   Smoking status: Every Day    Types: Cigars    Passive exposure: Yes   Smokeless tobacco: Never   Tobacco comments:    Lack and mild, marijuana   Vaping Use   Vaping status: Every Day  Substance and Sexual Activity   Alcohol use: Not Currently    Alcohol/week: 4.0 standard drinks of alcohol    Types: 2 Glasses of wine, 2 Shots of liquor per week   Drug use: Yes    Types: Marijuana, Methamphetamines    Comment: daily   Sexual activity: Yes    Birth control/protection: Condom  Other Topics Concern   Not on file  Social History Narrative   She lives with her uncle and aunt.   She has six siblings.   Social Determinants of Health   Financial Resource Strain: Medium Risk (06/28/2022)   Overall Financial Resource Strain (CARDIA)    Difficulty of Paying Living Expenses: Somewhat hard  Food Insecurity: No Food Insecurity (09/23/2022)   Hunger Vital Sign    Worried About Running Out of Food in the Last Year: Never true    Ran Out of Food in the Last Year: Never true  Transportation Needs: No Transportation Needs (09/23/2022)   PRAPARE - Administrator, Civil Service (Medical): No    Lack of Transportation (Non-Medical): No  Physical Activity: Inactive (06/28/2022)   Exercise Vital Sign    Days of Exercise per Week: 0 days    Minutes of Exercise per Session: 0 min  Stress: No Stress Concern Present (06/28/2022)   [employer] of Occupational Health - Occupational  Stress Questionnaire    Feeling of Stress : Only a little  Social Connections: Moderately Isolated (06/28/2022)   Social Connection and Isolation Panel [NHANES]    Frequency of Communication with Friends and Family: Once a week    Frequency of Social Gatherings with Friends and Family: Once a week    Attends Religious Services: More than 4 times per year    Active Member of [employer] or Organizations: Yes    Attends Banker Meetings: 1 to 4 times per year    Marital Status: Never married    Tobacco Cessation:  {Discharge tobacco cessation prescription:304700209}  Current Medications: Current Facility-Administered Medications  Medication  Dose Route Frequency Provider Last Rate Last Admin   acetaminophen (TYLENOL) tablet 650 mg  650 mg Oral Q4H PRN Pricilla Loveless, MD   650 mg at 02/11/23 1233   albuterol (VENTOLIN HFA) 108 (90 Base) MCG/ACT inhaler 2 puff  2 puff Inhalation Q6H PRN Pricilla Loveless, MD       alum & mag hydroxide-simeth (MAALOX/MYLANTA) 200-200-20 MG/5ML suspension 30 mL  30 mL Oral Q6H PRN Pricilla Loveless, MD       dicyclomine (BENTYL) tablet 20 mg  20 mg Oral Q6H PRN Pricilla Loveless, MD       hydrOXYzine (ATARAX) tablet 25 mg  25 mg Oral TID PRN Motley-Mangrum, Ahmon Tosi A, PMHNP       mometasone-formoterol (DULERA) 100-5 MCG/ACT inhaler 2 puff  2 puff Inhalation BID PRN Pricilla Loveless, MD       ondansetron (ZOFRAN) tablet 4 mg  4 mg Oral Q8H PRN Pricilla Loveless, MD       pantoprazole (PROTONIX) EC tablet 40 mg  40 mg Oral Daily Pricilla Loveless, MD   40 mg at 02/12/23 0958   promethazine (PHENERGAN) tablet 12.5 mg  12.5 mg Oral Q6H PRN Pricilla Loveless, MD       sertraline (ZOLOFT) tablet 100 mg  100 mg Oral Daily Pricilla Loveless, MD   100 mg at 02/12/23 0958   topiramate (TOPAMAX) tablet 25 mg  25 mg Oral BID Pricilla Loveless, MD   25 mg at 02/12/23 8295   traZODone (DESYREL) tablet 100 mg  100 mg Oral QHS Pricilla Loveless, MD   100 mg at 02/11/23 2135   Current Outpatient Medications  Medication Sig Dispense Refill   albuterol (VENTOLIN HFA) 108 (90 Base) MCG/ACT inhaler Inhale 2 puffs into the lungs every 6 (six) hours as needed for wheezing or shortness of breath. 8 g 2   budesonide-formoterol (SYMBICORT) 80-4.5 MCG/ACT inhaler Inhale 2 puffs into the lungs 2 (two) times daily. 1 each 12   chlorhexidine (PERIDEX) 0.12 % solution Use as directed 15 mLs in the mouth or throat 2 (two) times daily. 473 mL 1   dicyclomine (BENTYL) 20 MG tablet Take 1 tablet (20 mg total) by mouth every 6 (six) hours as needed for spasms (AB pain). 40 tablet 1   EPINEPHrine 0.3 mg/0.3 mL IJ SOAJ injection Inject into  mid outer thigh 1  time for 1 dose. 2 each 0   ibuprofen (ADVIL) 600 MG tablet Take 1 tablet (600 mg total) by mouth a night time as needed. 16 tablet 1   medroxyPROGESTERone (DEPO-PROVERA) 150 MG/ML injection Inject 1 mL (150 mg total) into the muscle every 3 (three) months. 1 mL 4   ondansetron (ZOFRAN-ODT) 4 MG disintegrating tablet Dissolve 1 tablet (4 mg total) by mouth every 8 (eight) hours as needed for nausea. 10 tablet 0  pantoprazole (PROTONIX) 40 MG tablet Take 1 tablet (40 mg total) by mouth daily. 30 tablet 5   promethazine (PHENERGAN) 25 MG tablet Take 0.5 tablets (12.5 mg total) by mouth every 6 (six) hours as needed for nausea or vomiting. 20 tablet 0   topiramate (TOPAMAX) 25 MG tablet Take 1 tablet (25 mg total) by mouth 2 (two) times daily. 60 tablet 0   hydrOXYzine (ATARAX) 25 MG tablet Take 1 tablet (25 mg total) by mouth 3 (three) times daily as needed for anxiety. 30 tablet 0   prochlorperazine (COMPAZINE) 10 MG tablet Take 1 tablet (10 mg total) by mouth every 8 (eight) hours. (Patient not taking: Reported on 01/20/2023) 9 tablet 0   sertraline (ZOLOFT) 100 MG tablet Take 1 tablet (100 mg total) by mouth daily. 30 tablet 0   traZODone (DESYREL) 100 MG tablet Take 1 tablet (100 mg total) by mouth every night. 30 tablet 0   PTA Medications: (Not in a hospital admission)   [place] Scale:  Flowsheet Row ED from 02/10/2023 in [facility] Emergency Department at [facility] Most recent reading at 02/10/2023 11:40 AM ED from 01/08/2023 in [facility] Emergency Department at [facility] Most recent reading at 01/08/2023  8:52 PM ED from 01/08/2023 in [facility] Emergency Department at [facility] Most recent reading at 01/08/2023  4:47 AM  C-SSRS RISK CATEGORY No Risk No Risk No Risk       Musculoskeletal: Strength & Muscle Tone: {desc; muscle tone:32375} Gait & Station: {PE GAIT ED ZOXW:96045} Patient leans: {Patient Leans:21022755}  Psychiatric Specialty  Exam: Presentation  General Appearance:  Appropriate for Environment  Eye Contact: Good  Speech: Clear and Coherent  Speech Volume: Normal  Handedness: Right   Mood and Affect  Mood: Euthymic  Affect: Appropriate   Thought Process  Thought Processes: Coherent  Descriptions of Associations:Intact  Orientation:Full (Time, Place and Person)  Thought Content:WDL  History of Schizophrenia/Schizoaffective disorder:No  Duration of Psychotic Symptoms:No data recorded Hallucinations:Hallucinations: None  Ideas of Reference:None  Suicidal Thoughts:Suicidal Thoughts: No  Homicidal Thoughts:Homicidal Thoughts: No   Sensorium  Memory: Immediate Fair; Recent Fair  Judgment: Fair  Insight: Good   Executive Functions  Concentration: Good  Attention Span: Good  Recall: Good  Fund of Knowledge: Good  Language: Good   Psychomotor Activity  Psychomotor Activity: Psychomotor Activity: Normal   Assets  Assets: Communication Skills; Desire for Improvement; Social Support; Housing   Sleep  Sleep: Sleep: Fair    Physical Exam: Physical Exam ROS Blood pressure 131/81, pulse 87, temperature 98.2 F (36.8 C), resp. rate 18, SpO2 98%. There is no height or weight on file to calculate BMI.   Demographic Factors:  {Demographic Factors:20662}  Loss Factors: {Loss Factors:20659}  Historical Factors: {Historical Factors:20660}  Risk Reduction Factors:   {Risk Reduction Factors:20661}  Continued Clinical Symptoms:  {Clinical Factors:22706}  Cognitive Features That Contribute To Risk:  {chl bhh Cognitive Features:304700251}    Suicide Risk:  {BHH SUICIDE RISK:22704}   Follow-up Information     [facility] Follow up.   Specialty: Urgent Care Why: If symptoms worsen, As needed Contact information: [address] [place] [address] [phone]                Plan Of  Care/Follow-up recommendations:  {[facility] DC FU RECOMMENDATIONS:22620}  Medical Decision Making: Patient is psychiatrically cleared. Patient case review and discussed with Dr. Lucianne Muss, and patient does not meet inpatient criteria for inpatient  psychiatric treatment. At time of discharge, patient denies SI, HI, AVH and can contract for safety. He demonstrated no overt evidence of psychosis or mania. Prior to discharge, he verbalized that they understood warning signs, triggers, and symptoms of worsening mental health and how to access emergency mental health care if they felt it was needed. Patient was instructed to call 911 or return to the emergency room if they experienced any concerning symptoms after discharge. Patient voiced understanding and agreed to the above.  Patient given resources to follow up with behavioral health urgent care for therapy and medication management. Patient denies access to weapons. Safety planning completed.   Disposition: Discharge  [name], PMHNP 02/12/2023, 12:08 PM

## 2023-02-12 NOTE — ED Notes (Signed)
Pt likes to speak to her best friend T[place] @ [phone]. Pt requested a note be placed because she doesn't know her # by heart.

## 2023-02-12 NOTE — Progress Notes (Signed)
Per [name], PMHNP- "Medical Decision Making: Pt is recommended for overnight observation; with possible discharge in the morning, as patient is currently not presenting as a danger to self or anyone else and her legal guardian [name] does not feel she is a danger to self or anyone else. Will see if OBS has any space for observation."  02/12/23 at 12:09am-Per Ene Ajibola,NP there is no availability for pt to transfer to [facility] unit; Address: [address] [place], [place] [address].   Care Team notified: [name], PMHNP, Liborio Nixon, NP, Ene Ajibola,NP   Maryjean Ka, MSW, [facility] 02/12/2023 12:29 AM

## 2023-02-12 NOTE — Progress Notes (Signed)
CSW spoke with pt's LG, [name], who states she would like for pt to return to hotel by [place]. CSW notified EDP, Paramedic, and Psych NP via secure chat. Psych meds were sent to [facility]. LG aware.   Paramedic informed of address: [address] [facility] [address] [place], [place] [address]  No further TOC needs. TOC signing off.

## 2023-02-14 ENCOUNTER — Telehealth: Payer: Self-pay | Admitting: *Deleted

## 2023-02-14 ENCOUNTER — Telehealth: Payer: Self-pay | Admitting: Internal Medicine

## 2023-02-14 NOTE — Telephone Encounter (Signed)
Called patient in reference to complaints about abdominal pain and vomiting. Instructed patient to go to the ED. Patient states they do nothing for her at the ED other than medicate her with tylenol and send her home. Informed the patient if she does not want to go to the ED, she could also go to the urgent care. Also called her guardian to inform her of the situation. Ms. Amy Wall wanted to see if it were possible to get an earlier appt for the EDG. The procedure is scheduled for 03/18/23. Nothing is available at this time, that would be sooner than 03/18/23.

## 2023-02-14 NOTE — Telephone Encounter (Signed)
(  Incorrect provider) Pt of Willette Cluster NP-Patient called stated she has been experiencing a lot of abdominal pain and she stated she is vomiting and the pain is getting really bad.

## 2023-02-21 ENCOUNTER — Other Ambulatory Visit (HOSPITAL_COMMUNITY): Payer: Self-pay

## 2023-02-22 ENCOUNTER — Other Ambulatory Visit (HOSPITAL_COMMUNITY): Payer: Self-pay

## 2023-02-23 ENCOUNTER — Ambulatory Visit (AMBULATORY_SURGERY_CENTER): Payer: MEDICAID

## 2023-02-23 VITALS — Ht 63.0 in | Wt 212.8 lb

## 2023-02-23 DIAGNOSIS — K529 Noninfective gastroenteritis and colitis, unspecified: Secondary | ICD-10-CM

## 2023-02-23 DIAGNOSIS — R112 Nausea with vomiting, unspecified: Secondary | ICD-10-CM

## 2023-02-23 DIAGNOSIS — R197 Diarrhea, unspecified: Secondary | ICD-10-CM

## 2023-02-23 DIAGNOSIS — R1013 Epigastric pain: Secondary | ICD-10-CM

## 2023-02-23 NOTE — Progress Notes (Signed)
No egg or soy allergy known to patient  No issues known to pt with past sedation with any surgeries or procedures Patient denies ever being told they had issues or difficulty with intubation  No FH of Malignant Hyperthermia Pt is not on diet pills Pt is not on  home 02  Pt is not on blood thinners  Pt reports periods of diarrhea followed by constipation  No A fib or A flutter Have any cardiac testing pending--no  LOA: independent   Patient's chart reviewed by Cathlyn Parsons CNRA prior to previsit and patient appropriate for the [facility].  Previsit completed and red dot placed by patient's name on their procedure day (on provider's schedule).     PV competed with patient. Prep instructions sent via mychart and hard copy given at [facility].

## 2023-02-25 ENCOUNTER — Other Ambulatory Visit (HOSPITAL_COMMUNITY): Payer: Self-pay

## 2023-03-03 ENCOUNTER — Ambulatory Visit (INDEPENDENT_AMBULATORY_CARE_PROVIDER_SITE_OTHER): Payer: MEDICAID | Admitting: Allergy & Immunology

## 2023-03-03 ENCOUNTER — Encounter: Payer: Self-pay | Admitting: Allergy & Immunology

## 2023-03-03 VITALS — BP 116/74 | HR 99 | Temp 98.2°F | Ht 63.4 in | Wt 208.4 lb

## 2023-03-03 DIAGNOSIS — J310 Chronic rhinitis: Secondary | ICD-10-CM

## 2023-03-03 DIAGNOSIS — R221 Localized swelling, mass and lump, neck: Secondary | ICD-10-CM

## 2023-03-03 DIAGNOSIS — R220 Localized swelling, mass and lump, head: Secondary | ICD-10-CM | POA: Diagnosis not present

## 2023-03-03 DIAGNOSIS — J4530 Mild persistent asthma, uncomplicated: Secondary | ICD-10-CM | POA: Diagnosis not present

## 2023-03-03 NOTE — Patient Instructions (Addendum)
1. Mild persistent asthma, uncomplicated - Lung testing was in the 70% range, but it improved with albuterol. - I would recommend using her Symbicort 2 puffs twice daily every single day. - Spacer use reviewed. - Daily controller medication(s): Symbicort 80/4.54mcg two puffs twice daily with spacer - Prior to physical activity: albuterol 2 puffs 10-15 minutes before physical activity. - Rescue medications: albuterol 4 puffs every 4-6 hours as needed - Asthma control goals:  * Full participation in all desired activities (may need albuterol before activity) * Albuterol use two time or less a week on average (not counting use with activity) * Cough interfering with sleep two time or less a month * Oral steroids no more than once a year * No hospitalizations  2. Chronic rhinitis - Because of insurance stipulations, we cannot do skin testing on the same day as a new patient visit. - We we will do testing at the next visit with plastic sticks. - Be sure to stay off of all antihistamines for 3 days before the next visit. - This testing will answer a lot more of the questions we have today.  3. Swelling of lip, tongue, and throat - We will do testing strawberries, peanut, tree nuts, sesame, seafood, and pineapple at the next visit. - We can refill your EpiPen today. - EpiPen training provided. - We will update your emergency action plan after we do the testing.  4. Return in about 6 weeks (around 04/14/2023) for TESTING (environmentals and selected foods). You can have the follow up appointment with Dr. Dellis Anes or a Nurse Practicioner (our Nurse Practitioners are excellent and always have Physician oversight!).    Please inform us of any Emergency Department visits, hospitalizations, or changes in symptoms. Call us before going to the ED for breathing or allergy symptoms since we might be able to fit you in for a sick visit. Feel free to contact us anytime with any questions, problems, or  concerns.  It was a pleasure to meet you and your Social Worker today!  Websites that have reliable patient information: 1. American Academy of Asthma, Allergy, and Immunology: www.aaaai.org 2. Food Allergy Research and Education (FARE): foodallergy.org 3. Mothers of Asthmatics: http://www.asthmacommunitynetwork.org 4. American College of Allergy, Asthma, and Immunology: www.acaai.org   COVID-19 Vaccine Information can be found at: PodExchange.nl For questions related to vaccine distribution or appointments, please email [email or web address] or call [phone].     "Like" [place] on Facebook and Instagram for our latest updates!      A healthy democracy works best when [employer] participate! Make sure you are registered to vote! If you have moved or changed any of your contact information, you will need to get this updated before voting! Scan the QR codes below to learn more!
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
## 2023-03-04 ENCOUNTER — Emergency Department (HOSPITAL_COMMUNITY)
Admission: EM | Admit: 2023-03-04 | Discharge: 2023-03-05 | Disposition: A | Discharge status: Home / Self Care | Payer: MEDICAID | Attending: Emergency Medicine | Admitting: Emergency Medicine

## 2023-03-04 ENCOUNTER — Other Ambulatory Visit (HOSPITAL_COMMUNITY): Payer: Self-pay

## 2023-03-04 ENCOUNTER — Encounter (HOSPITAL_COMMUNITY): Payer: Self-pay

## 2023-03-04 ENCOUNTER — Emergency Department (HOSPITAL_COMMUNITY): Payer: MEDICAID

## 2023-03-04 ENCOUNTER — Other Ambulatory Visit: Payer: Self-pay

## 2023-03-04 DIAGNOSIS — N898 Other specified noninflammatory disorders of vagina: Secondary | ICD-10-CM | POA: Insufficient documentation

## 2023-03-04 DIAGNOSIS — R112 Nausea with vomiting, unspecified: Secondary | ICD-10-CM | POA: Insufficient documentation

## 2023-03-04 DIAGNOSIS — Z91010 Allergy to peanuts: Secondary | ICD-10-CM | POA: Insufficient documentation

## 2023-03-04 DIAGNOSIS — R1084 Generalized abdominal pain: Secondary | ICD-10-CM | POA: Insufficient documentation

## 2023-03-04 DIAGNOSIS — E876 Hypokalemia: Secondary | ICD-10-CM | POA: Insufficient documentation

## 2023-03-04 DIAGNOSIS — R509 Fever, unspecified: Secondary | ICD-10-CM | POA: Diagnosis not present

## 2023-03-04 DIAGNOSIS — R1030 Lower abdominal pain, unspecified: Secondary | ICD-10-CM | POA: Diagnosis present

## 2023-03-04 DIAGNOSIS — Z20822 Contact with and (suspected) exposure to covid-19: Secondary | ICD-10-CM | POA: Insufficient documentation

## 2023-03-04 LAB — URINALYSIS, W/ REFLEX TO CULTURE (INFECTION SUSPECTED)
Bilirubin Urine: NEGATIVE
Glucose, UA: NEGATIVE mg/dL
Hgb urine dipstick: NEGATIVE
Ketones, ur: NEGATIVE mg/dL
Nitrite: NEGATIVE
Protein, ur: 30 mg/dL — AB
Specific Gravity, Urine: 1.015 (ref 1.005–1.030)
WBC, UA: >50 WBC/hpf (ref 0–5)
pH: 5 (ref 5.0–8.0)

## 2023-03-04 LAB — HCG, SERUM, QUALITATIVE: Preg, Serum: NEGATIVE

## 2023-03-04 LAB — CBC
HCT: 35.1 % — ABNORMAL LOW (ref 36.0–46.0)
Hemoglobin: 11.3 g/dL — ABNORMAL LOW (ref 12.0–15.0)
MCH: 28.1 pg (ref 26.0–34.0)
MCHC: 32.2 g/dL (ref 30.0–36.0)
MCV: 87.3 fL (ref 80.0–100.0)
Platelets: 391 10*3/uL (ref 150–400)
RBC: 4.02 MIL/uL (ref 3.87–5.11)
RDW: 12.7 % (ref 11.5–15.5)
WBC: 10.4 10*3/uL (ref 4.0–10.5)
nRBC: 0 % (ref 0.0–0.2)

## 2023-03-04 LAB — COMPREHENSIVE METABOLIC PANEL
ALT: 19 U/L (ref 0–44)
AST: 18 U/L (ref 15–41)
Albumin: 3.5 g/dL (ref 3.5–5.0)
Alkaline Phosphatase: 94 U/L (ref 38–126)
Anion gap: 10 (ref 5–15)
BUN: 5 mg/dL — ABNORMAL LOW (ref 6–20)
CO2: 24 mmol/L (ref 22–32)
Calcium: 8.6 mg/dL — ABNORMAL LOW (ref 8.9–10.3)
Chloride: 99 mmol/L (ref 98–111)
Creatinine, Ser: 0.68 mg/dL (ref 0.44–1.00)
GFR, Estimated: >60 mL/min (ref >60)
Glucose, Bld: 86 mg/dL (ref 70–99)
Potassium: 2.6 mmol/L — CL (ref 3.5–5.1)
Sodium: 133 mmol/L — ABNORMAL LOW (ref 135–145)
Total Bilirubin: 0.6 mg/dL (ref 0.3–1.2)
Total Protein: 7.6 g/dL (ref 6.5–8.1)

## 2023-03-04 LAB — LIPASE, BLOOD: Lipase: 23 U/L (ref 11–51)

## 2023-03-04 LAB — I-STAT CG4 LACTIC ACID, ED: Lactic Acid, Venous: 0.8 mmol/L (ref 0.5–1.9)

## 2023-03-04 LAB — PROTIME-INR
INR: 1.2 (ref 0.8–1.2)
Prothrombin Time: 15.1 s (ref 11.4–15.2)

## 2023-03-04 MED ORDER — ACETAMINOPHEN 325 MG PO TABS
650.0000 mg | ORAL_TABLET | Freq: Once | ORAL | Status: AC
  Administered 2023-03-04: 650 mg via ORAL
  Filled 2023-03-04: qty 2

## 2023-03-04 MED ORDER — AMOXICILLIN 500 MG PO CAPS
500.0000 mg | ORAL_CAPSULE | Freq: Three times a day (TID) | ORAL | 0 refills | Status: DC
  Filled 2023-03-04: qty 21, 7d supply, fill #0

## 2023-03-04 MED ORDER — ONDANSETRON 4 MG PO TBDP
4.0000 mg | ORAL_TABLET | Freq: Once | ORAL | Status: AC
  Administered 2023-03-04: 4 mg via ORAL
  Filled 2023-03-04: qty 1

## 2023-03-04 NOTE — ED Triage Notes (Signed)
Patient reports lower abdominal pain, nausea, and vomiting x 3 days. On assessment patient has a fever. Patient also had an asthma exacerbation yesterday and is still having some SOB.

## 2023-03-05 ENCOUNTER — Emergency Department (HOSPITAL_COMMUNITY): Payer: MEDICAID

## 2023-03-05 ENCOUNTER — Other Ambulatory Visit (HOSPITAL_COMMUNITY): Payer: Self-pay

## 2023-03-05 LAB — WET PREP, GENITAL
Clue Cells Wet Prep HPF POC: NONE SEEN
Sperm: NONE SEEN
Trich, Wet Prep: NONE SEEN
WBC, Wet Prep HPF POC: <10 (ref <10)
Yeast Wet Prep HPF POC: NONE SEEN

## 2023-03-05 LAB — RESP PANEL BY RT-PCR (RSV, FLU A&B, COVID)  RVPGX2
Influenza A by PCR: NEGATIVE
Influenza B by PCR: NEGATIVE
Resp Syncytial Virus by PCR: NEGATIVE
SARS Coronavirus 2 by RT PCR: NEGATIVE

## 2023-03-05 MED ORDER — LIDOCAINE HCL 1 % IJ SOLN
INTRAMUSCULAR | Status: AC
  Administered 2023-03-05: 2.1 mL
  Filled 2023-03-05: qty 20

## 2023-03-05 MED ORDER — IOHEXOL 300 MG/ML  SOLN
100.0000 mL | Freq: Once | INTRAMUSCULAR | Status: AC | PRN
  Administered 2023-03-05: 100 mL via INTRAVENOUS

## 2023-03-05 MED ORDER — POTASSIUM CHLORIDE CRYS ER 20 MEQ PO TBCR: 40.0000 meq | EXTENDED_RELEASE_TABLET | Freq: Once | ORAL | Status: DC

## 2023-03-05 MED ORDER — MORPHINE SULFATE (PF) 4 MG/ML IV SOLN
4.0000 mg | Freq: Once | INTRAVENOUS | Status: AC
  Administered 2023-03-05: 4 mg via INTRAVENOUS
  Filled 2023-03-05: qty 1

## 2023-03-05 MED ORDER — CEFTRIAXONE SODIUM 1 G IJ SOLR
500.0000 mg | Freq: Once | INTRAMUSCULAR | Status: AC
  Administered 2023-03-05: 500 mg via INTRAMUSCULAR
  Filled 2023-03-05: qty 10

## 2023-03-05 MED ORDER — DOXYCYCLINE HYCLATE 100 MG PO CAPS
100.0000 mg | ORAL_CAPSULE | Freq: Two times a day (BID) | ORAL | 0 refills | Status: DC
  Filled 2023-03-05: qty 20, 10d supply, fill #0

## 2023-03-05 MED ORDER — ONDANSETRON HCL 4 MG/2ML IJ SOLN
4.0000 mg | Freq: Once | INTRAMUSCULAR | Status: AC
  Administered 2023-03-05: 4 mg via INTRAVENOUS
  Filled 2023-03-05: qty 2

## 2023-03-05 MED ORDER — POTASSIUM CHLORIDE ER 10 MEQ PO TBCR
10.0000 meq | EXTENDED_RELEASE_TABLET | Freq: Every day | ORAL | 0 refills | Status: DC
  Filled 2023-03-05: qty 4, 4d supply, fill #0

## 2023-03-05 MED ORDER — DOXYCYCLINE HYCLATE 100 MG PO TABS
100.0000 mg | ORAL_TABLET | Freq: Once | ORAL | Status: AC
  Administered 2023-03-05: 100 mg via ORAL
  Filled 2023-03-05: qty 1

## 2023-03-05 MED ORDER — ONDANSETRON 4 MG PO TBDP
4.0000 mg | ORAL_TABLET | Freq: Three times a day (TID) | ORAL | 0 refills | Status: DC | PRN
  Filled 2023-03-05: qty 12, 4d supply, fill #0

## 2023-03-05 MED ORDER — METRONIDAZOLE 500 MG PO TABS
500.0000 mg | ORAL_TABLET | Freq: Two times a day (BID) | ORAL | 0 refills | Status: DC
  Filled 2023-03-05: qty 14, 7d supply, fill #0

## 2023-03-05 MED ORDER — POTASSIUM CHLORIDE 10 MEQ/100ML IV SOLN
10.0000 meq | INTRAVENOUS | Status: AC
Start: 2023-03-05 — End: 2023-03-05
  Administered 2023-03-05 (×2): 10 meq via INTRAVENOUS
  Filled 2023-03-05 (×2): qty 100

## 2023-03-05 MED ORDER — ACETAMINOPHEN 325 MG PO TABS
650.0000 mg | ORAL_TABLET | Freq: Once | ORAL | Status: AC
  Administered 2023-03-05: 650 mg via ORAL
  Filled 2023-03-05: qty 2

## 2023-03-05 MED ORDER — METRONIDAZOLE 500 MG PO TABS
500.0000 mg | ORAL_TABLET | Freq: Once | ORAL | Status: AC
  Administered 2023-03-05: 500 mg via ORAL
  Filled 2023-03-05: qty 1

## 2023-03-05 NOTE — Discharge Instructions (Addendum)
Follow up in your mychart account for your gonorrhea/chlamydia test results.  Take Flagyl and Doxycycline as prescribed.  Take Zofran as needed for nausea and vomiting.  Take Motrin and Tylenol for fever and pain as directed.  Your potassium was low from vomiting, a prescription for potassium has been sent to your pharmacy for a few days.   Recheck with your PCP, return to the ER for worsening or concerning symptoms.

## 2023-03-05 NOTE — ED Notes (Signed)
Spoke with legal guardian, [name] and confirmed that pt was okay to be discharged. Informed guardian that pt states she will take an Amy Wall back home and an extra copy of the AVS will be sent with pt for the legal guardian.

## 2023-03-05 NOTE — ED Notes (Addendum)
Pt able to tolerate oral meds and water at this time. Passed PO challenge

## 2023-03-05 NOTE — ED Notes (Signed)
Quintella Reichert, RN spoke to legal guardian to give update on pt's condition. Legal guardian gives consent for treatment at this time.

## 2023-03-05 NOTE — ED Provider Notes (Signed)
[facility] EMERGENCY DEPARTMENT AT [facility] Provider Note   CSN: [id number] Arrival date & time: 03/04/23  1823     History  Chief Complaint  Patient presents with   Abdominal Pain    Amy Wall is a 17 y.o. female.  17 year old female with complaint of lower abdominal pain onset 3 days ago with vomiting (non bloody). Denies changes in bowel or bladder habits, vaginal discharge. Patient is sexually active, no new partners, no history of STIs. Patient went to see her pulmonologist, had meds refilled, denies cough or UTI symptoms. Febrile on arrival, no sick contacts.        Home Medications Prior to Admission medications   Medication Sig Start Date End Date Taking? Authorizing Provider  doxycycline (VIBRAMYCIN) 100 MG capsule Take 1 capsule (100 mg total) by mouth 2 (two) times daily. 03/05/23  Yes Jeannie Fend, PA-C  metroNIDAZOLE (FLAGYL) 500 MG tablet Take 1 tablet (500 mg total) by mouth 2 (two) times daily. 03/05/23  Yes Jeannie Fend, PA-C  ondansetron (ZOFRAN-ODT) 4 MG disintegrating tablet Take 1 tablet (4 mg total) by mouth every 8 (eight) hours as needed for nausea or vomiting. 03/05/23  Yes Jeannie Fend, PA-C  potassium chloride (KLOR-CON) 10 MEQ tablet Take 1 tablet (10 mEq total) by mouth daily. 03/05/23  Yes Jeannie Fend, PA-C  albuterol (VENTOLIN HFA) 108 (90 Base) MCG/ACT inhaler Inhale 2 puffs into the lungs every 6 (six) hours as needed for wheezing or shortness of breath. 07/08/22   Del Newman Nip, Tenna Child, FNP  budesonide-formoterol (SYMBICORT) 80-4.5 MCG/ACT inhaler Inhale 2 puffs into the lungs 2 (two) times daily. 07/08/22   Del Nigel Berthold, FNP  EPINEPHrine 0.3 mg/0.3 mL IJ SOAJ injection Inject into  mid outer thigh 1 time for 1 dose. 12/13/22     hydrOXYzine (ATARAX) 25 MG tablet Take 1 tablet (25 mg total) by mouth 3 (three) times daily as needed for anxiety. 02/12/23   Motley-Mangrum, Geralynn Ochs A, PMHNP  ibuprofen  (ADVIL) 600 MG tablet Take 1 tablet (600 mg total) by mouth a night time as needed. 01/24/23     medroxyPROGESTERone (DEPO-PROVERA) 150 MG/ML injection Inject 1 mL (150 mg total) into the muscle every 3 (three) months. 07/29/22   Adline Potter, NP  pantoprazole (PROTONIX) 40 MG tablet Take 1 tablet (40 mg total) by mouth daily. 12/27/22   Meredith Pel, NP  promethazine (PHENERGAN) 25 MG tablet Take 0.5 tablets (12.5 mg total) by mouth every 6 (six) hours as needed for nausea or vomiting. 01/21/23   Jenel Lucks, MD  sertraline (ZOLOFT) 100 MG tablet Take 1 tablet (100 mg total) by mouth daily. 02/12/23   Motley-Mangrum, Ezra Sites, PMHNP  topiramate (TOPAMAX) 25 MG tablet Take 1 tablet (25 mg total) by mouth 2 (two) times daily. 12/11/22     traZODone (DESYREL) 100 MG tablet Take 1 tablet (100 mg total) by mouth every night. 02/12/23   Motley-Mangrum, Ezra Sites, PMHNP      Allergies    Peanut-containing drug products, Pineapple, Shellfish allergy, Strawberry extract, Charentais melon (french melon), and Chocolate    Review of Systems   Review of Systems Negative except as per HPI Physical Exam Updated Vital Signs BP 125/69   Pulse 88   Temp 99.4 F (37.4 C) (Oral)   Resp (!) 22   Ht 5\' 3"  (1.6 m)   Wt 94.5 kg   SpO2 98%   BMI 36.92  kg/m  Physical Exam Vitals and nursing note reviewed. Exam conducted with a chaperone present (RN [place]).  Constitutional:      General: She is not in acute distress.    Appearance: She is well-developed. She is not diaphoretic.  HENT:     Head: Normocephalic and atraumatic.  Cardiovascular:     Rate and Rhythm: Normal rate and regular rhythm.  Pulmonary:     Effort: Pulmonary effort is normal.     Breath sounds: Normal breath sounds.  Abdominal:     Palpations: Abdomen is soft.     Tenderness: There is generalized abdominal tenderness. There is rebound. There is no right CVA tenderness or left CVA tenderness.     Comments:  Slight RLQ rebound tenderness   Genitourinary:    Vagina: Vaginal discharge present.     Cervix: No cervical motion tenderness or friability.     Comments: Mild generalized tenderness, mild white dc Skin:    General: Skin is warm and dry.  Neurological:     Mental Status: She is alert and oriented to person, place, and time.  Psychiatric:        Behavior: Behavior normal.     ED Results / Procedures / Treatments   Labs (all labs ordered are listed, but only abnormal results are displayed) Labs Reviewed  COMPREHENSIVE METABOLIC PANEL - Abnormal; Notable for the following components:      Result Value   Sodium 133 (*)    Potassium 2.6 (*)    BUN 5 (*)    Calcium 8.6 (*)    All other components within normal limits  CBC - Abnormal; Notable for the following components:   Hemoglobin 11.3 (*)    HCT 35.1 (*)    All other components within normal limits  URINALYSIS, W/ REFLEX TO CULTURE (INFECTION SUSPECTED) - Abnormal; Notable for the following components:   Color, Urine AMBER (*)    APPearance CLOUDY (*)    Protein, ur 30 (*)    Leukocytes,Ua LARGE (*)    Bacteria, UA MANY (*)    All other components within normal limits  RESP PANEL BY RT-PCR (RSV, FLU A&B, COVID)  RVPGX2  WET PREP, GENITAL  CULTURE, BLOOD (ROUTINE X 2)  CULTURE, BLOOD (ROUTINE X 2)  LIPASE, BLOOD  HCG, SERUM, QUALITATIVE  PROTIME-INR  MAGNESIUM  I-STAT CG4 LACTIC ACID, ED  I-STAT CG4 LACTIC ACID, ED  GC/CHLAMYDIA PROBE AMP ([place]) NOT AT [facility]    EKG None  Radiology CT ABDOMEN PELVIS W CONTRAST  Result Date: 03/05/2023 CLINICAL DATA:  Lower pain nausea and vomiting EXAM: CT ABDOMEN AND PELVIS WITH CONTRAST TECHNIQUE: Multidetector CT imaging of the abdomen and pelvis was performed using the standard protocol following bolus administration of intravenous contrast. RADIATION DOSE REDUCTION: This exam was performed according to the departmental dose-optimization program which includes automated  exposure control, adjustment of the mA and/or kV according to patient size and/or use of iterative reconstruction technique. CONTRAST:  OMNIPAQUE IOHEXOL 300 MG/ML  SOLN COMPARISON:  CT 01/03/2023 FINDINGS: Lower chest: No acute abnormality. Hepatobiliary: No focal liver abnormality is seen. Status post cholecystectomy. No biliary dilatation. Pancreas: Unremarkable. No pancreatic ductal dilatation or surrounding inflammatory changes. Spleen: Normal in size without focal abnormality. Adrenals/Urinary Tract: Adrenal glands are unremarkable. Kidneys are normal, without renal calculi, focal lesion, or hydronephrosis. Bladder is unremarkable. Stomach/Bowel: Stomach is within normal limits. Appendix appears normal. No evidence of bowel wall thickening, distention, or inflammatory changes. Some fluid-filled large and  small bowel but no convincing obstruction or acute inflammatory process. Vascular/Lymphatic: No significant vascular findings are present. No enlarged abdominal or pelvic lymph nodes. Reproductive: Uterus and bilateral adnexa are unremarkable. Other: No abdominal wall hernia or abnormality. No abdominopelvic ascites. Musculoskeletal: No acute or significant osseous findings. IMPRESSION: 1. No CT evidence for acute intra-abdominal or pelvic abnormality. 2. Some fluid-filled large and small bowel but no convincing obstruction or acute inflammatory process. Electronically Signed   By: Jasmine Pang M.D.   On: 03/05/2023 01:52   DG Chest 2 View  Result Date: 03/04/2023 CLINICAL DATA:  Suspected sepsis EXAM: CHEST - 2 VIEW COMPARISON:  Chest x-ray 05/16/2020 FINDINGS: The heart size and mediastinal contours are within normal limits. Both lungs are clear. The visualized skeletal structures are unremarkable. IMPRESSION: No active cardiopulmonary disease. Electronically Signed   By: Darliss Cheney M.D.   On: 03/04/2023 21:06    Procedures Procedures    Medications Ordered in ED Medications  potassium  chloride SA (KLOR-CON M) CR tablet 40 mEq (40 mEq Oral Not Given 03/05/23 0312)  acetaminophen (TYLENOL) tablet 650 mg (650 mg Oral Given 03/04/23 2030)  ondansetron (ZOFRAN-ODT) disintegrating tablet 4 mg (4 mg Oral Given 03/04/23 2030)  iohexol (OMNIPAQUE) 300 MG/ML solution 100 mL (100 mLs Intravenous Contrast Given 03/05/23 0051)  potassium chloride 10 mEq in 100 mL IVPB (0 mEq Intravenous Stopped 03/05/23 0437)  ondansetron (ZOFRAN) injection 4 mg (4 mg Intravenous Given 03/05/23 0303)  morphine (PF) 4 MG/ML injection 4 mg (4 mg Intravenous Given 03/05/23 0303)  acetaminophen (TYLENOL) tablet 650 mg (650 mg Oral Given 03/05/23 0456)  cefTRIAXone (ROCEPHIN) injection 500 mg (500 mg Intramuscular Given 03/05/23 0457)  doxycycline (VIBRA-TABS) tablet 100 mg (100 mg Oral Given 03/05/23 0501)  metroNIDAZOLE (FLAGYL) tablet 500 mg (500 mg Oral Given 03/05/23 0501)  ondansetron (ZOFRAN) injection 4 mg (4 mg Intravenous Given 03/05/23 0438)  lidocaine (XYLOCAINE) 1 % (with pres) injection (2.1 mLs  Given 03/05/23 0458)    ED Course/ Medical Decision Making/ A&P                                 Medical Decision Making Amount and/or Complexity of Data Reviewed Labs: ordered. Radiology: ordered.  Risk OTC drugs. Prescription drug management.   This patient presents to the ED for concern of abdominal pain, this involves an extensive number of treatment options, and is a complaint that carries with it a high risk of complications and morbidity.  The differential diagnosis includes but not limited to appendicitis, viral illness (covid), PID, TOA   Co morbidities that complicate the patient evaluation  Asthma, HTN, schizophrenia, epilepsy   Additional history obtained:  External records from outside source obtained and reviewed including prior labs on file for comparison    Lab Tests:  I Ordered, and personally interpreted labs.  The pertinent results include:  lactic acid 0.8,  lipase WNL. Hcg negative. CMP with hypokalemia at 2.6. CBC with mild anemia with hgb 11.3. INR normal. Large leukocytes, many bacteria, contaminated    Imaging Studies ordered:  I ordered imaging studies including CT abdomen/pelvis   I independently visualized and interpreted imaging which showed no free air I agree with the radiologist interpretation   Consultations Obtained:  I requested consultation with the ER attending, Dr. Madilyn Hook,  and discussed lab and imaging findings as well as pertinent plan - they recommend: reviewed CT, consider gastroenteritis possibility  Problem List / ED Course / Critical interventions / Medication management  17 year old female presents with complaint of abdominal pain with nausea and vomiting. Found to have generalized tenderness on exam with slight rebound tenderness in the RLQ. Febrile on arrival. Found to have hypokalemia, provided with IVK, continued to vomiting, provided with additional zofran. Labs and imaging overwise reassuring. Reported feeling nauseous prior to taking oral K, doxy, flagyl for PID coverage, provided with additional zofran and was able to tolerate PO meds without difficulty. Patient provided with results and plan of care, all questions answered. Return to ER for worsening or concerning symptoms, otherwise, follow up with PCP.  I ordered medication including potassium for hypokalemia. Antibiotics for possible PID. Morphine and zofran for pain and nausea  Reevaluation of the patient after these medicines showed that the patient improved I have reviewed the patients home medicines and have made adjustments as needed   Social Determinants of Health:  Lives with guardian who is not present in the ER   Test / Admission - Considered:  Stable for dc         Final Clinical Impression(s) / ED Diagnoses Final diagnoses:  Generalized abdominal pain  Nausea and vomiting, unspecified vomiting type  Fever, unspecified fever cause   Hypokalemia    Rx / DC Orders ED Discharge Orders          Ordered    potassium chloride (KLOR-CON) 10 MEQ tablet  Daily        03/05/23 0638    ondansetron (ZOFRAN-ODT) 4 MG disintegrating tablet  Every 8 hours PRN        03/05/23 0638    metroNIDAZOLE (FLAGYL) 500 MG tablet  2 times daily        03/05/23 0638    doxycycline (VIBRAMYCIN) 100 MG capsule  2 times daily        03/05/23 0638              Jeannie Fend, PA-C 03/05/23 2440    Tilden Fossa, MD 03/05/23 [phone]

## 2023-03-07 ENCOUNTER — Telehealth: Payer: Self-pay

## 2023-03-07 ENCOUNTER — Emergency Department (HOSPITAL_COMMUNITY): Payer: MEDICAID

## 2023-03-07 ENCOUNTER — Encounter (HOSPITAL_COMMUNITY): Payer: Self-pay | Admitting: Emergency Medicine

## 2023-03-07 ENCOUNTER — Other Ambulatory Visit: Payer: Self-pay

## 2023-03-07 ENCOUNTER — Emergency Department (HOSPITAL_COMMUNITY)
Admission: EM | Admit: 2023-03-07 | Discharge: 2023-03-08 | Disposition: A | Discharge status: Home / Self Care | Payer: MEDICAID | Attending: Emergency Medicine | Admitting: Emergency Medicine

## 2023-03-07 ENCOUNTER — Other Ambulatory Visit (HOSPITAL_COMMUNITY): Payer: Self-pay

## 2023-03-07 DIAGNOSIS — E876 Hypokalemia: Secondary | ICD-10-CM | POA: Insufficient documentation

## 2023-03-07 DIAGNOSIS — J45909 Unspecified asthma, uncomplicated: Secondary | ICD-10-CM | POA: Diagnosis not present

## 2023-03-07 DIAGNOSIS — I10 Essential (primary) hypertension: Secondary | ICD-10-CM | POA: Insufficient documentation

## 2023-03-07 DIAGNOSIS — R112 Nausea with vomiting, unspecified: Secondary | ICD-10-CM | POA: Insufficient documentation

## 2023-03-07 DIAGNOSIS — Z91010 Allergy to peanuts: Secondary | ICD-10-CM | POA: Diagnosis not present

## 2023-03-07 DIAGNOSIS — R102 Pelvic and perineal pain: Secondary | ICD-10-CM | POA: Insufficient documentation

## 2023-03-07 LAB — COMPREHENSIVE METABOLIC PANEL
ALT: 20 U/L (ref 0–44)
AST: 18 U/L (ref 15–41)
Albumin: 3.4 g/dL — ABNORMAL LOW (ref 3.5–5.0)
Alkaline Phosphatase: 90 U/L (ref 38–126)
Anion gap: 10 (ref 5–15)
BUN: <5 mg/dL — ABNORMAL LOW (ref 6–20)
CO2: 25 mmol/L (ref 22–32)
Calcium: 9 mg/dL (ref 8.9–10.3)
Chloride: 102 mmol/L (ref 98–111)
Creatinine, Ser: 0.53 mg/dL (ref 0.44–1.00)
GFR, Estimated: >60 mL/min (ref >60)
Glucose, Bld: 103 mg/dL — ABNORMAL HIGH (ref 70–99)
Potassium: 2.9 mmol/L — ABNORMAL LOW (ref 3.5–5.1)
Sodium: 137 mmol/L (ref 135–145)
Total Bilirubin: 0.5 mg/dL (ref 0.3–1.2)
Total Protein: 7.9 g/dL (ref 6.5–8.1)

## 2023-03-07 LAB — CBC
HCT: 34.9 % — ABNORMAL LOW (ref 36.0–46.0)
Hemoglobin: 11.8 g/dL — ABNORMAL LOW (ref 12.0–15.0)
MCH: 28.9 pg (ref 26.0–34.0)
MCHC: 33.8 g/dL (ref 30.0–36.0)
MCV: 85.3 fL (ref 80.0–100.0)
Platelets: 420 10*3/uL — ABNORMAL HIGH (ref 150–400)
RBC: 4.09 MIL/uL (ref 3.87–5.11)
RDW: 12.8 % (ref 11.5–15.5)
WBC: 6.6 10*3/uL (ref 4.0–10.5)
nRBC: 0 % (ref 0.0–0.2)

## 2023-03-07 LAB — GC/CHLAMYDIA PROBE AMP (~~LOC~~) NOT AT ARMC
Chlamydia: POSITIVE — AB
Comment: NEGATIVE
Comment: NORMAL
Neisseria Gonorrhea: POSITIVE — AB

## 2023-03-07 LAB — LIPASE, BLOOD: Lipase: 28 U/L (ref 11–51)

## 2023-03-07 LAB — HCG, SERUM, QUALITATIVE: Preg, Serum: NEGATIVE

## 2023-03-07 MED ORDER — METOCLOPRAMIDE HCL 5 MG/ML IJ SOLN
10.0000 mg | Freq: Once | INTRAMUSCULAR | Status: AC
  Administered 2023-03-08: 10 mg via INTRAVENOUS
  Filled 2023-03-07: qty 2

## 2023-03-07 MED ORDER — DOXYCYCLINE HYCLATE 100 MG IV SOLR
100.0000 mg | Freq: Once | INTRAVENOUS | Status: AC
  Administered 2023-03-08: 100 mg via INTRAVENOUS
  Filled 2023-03-07: qty 100

## 2023-03-07 MED ORDER — SODIUM CHLORIDE 0.9 % IV SOLN
3.0000 g | Freq: Once | INTRAVENOUS | Status: AC
  Administered 2023-03-08: 3 g via INTRAVENOUS
  Filled 2023-03-07: qty 8

## 2023-03-07 MED ORDER — AMOXICILLIN 500 MG PO CAPS
500.0000 mg | ORAL_CAPSULE | Freq: Three times a day (TID) | ORAL | 0 refills | Status: DC
  Filled 2023-03-07: qty 21, 7d supply, fill #0

## 2023-03-07 NOTE — ED Triage Notes (Signed)
  Patient comes in with lower abdominal pain and emesis that has been going on for 1 week.  Patient was seen on 10/25 and treated for gonorrhea and chlamydia.  Patient states she has been taking antibiotics but still having pain/emesis.  Took a dose of zofran around 1700.  States she has not missed a dose of antibiotics.  Pain 7/10, stabbing.

## 2023-03-07 NOTE — Transitions of Care (Post Inpatient/ED Visit) (Unsigned)
   03/07/2023  Name: Amy Wall MRN: [id number] DOB: 2006/02/28  Today's TOC FU Call Status: Today's TOC FU Call Status:: Unsuccessful Call (1st Attempt) Unsuccessful Call (1st Attempt) Date: 03/07/23  Attempted to reach the patient regarding the most recent Inpatient/ED visit.  Follow Up Plan: Additional outreach attempts will be made to reach the patient to complete the Transitions of Care (Post Inpatient/ED visit) call.   Signature Arvil Persons, BSN, Charity fundraiser

## 2023-03-07 NOTE — ED Provider Notes (Signed)
[place] EMERGENCY DEPARTMENT AT [facility] Provider Note   CSN: [id number] Arrival date & time: 03/07/23  1936     History  Chief Complaint  Patient presents with   Abdominal Pain   Emesis    Amy Wall is a 17 y.o. female.  17 year old female returns with ongoing lower abdominal pain, nausea, vomiting. Patient was seen here 03/04/23 for same, diagnosed with PID (gonorrhea and chlamydia positive) and treated with Rocephin, Flagyl and Doxy. Hypokalemia, treated with PO and IV K replacement and dc home with a few days of oral K and zofran. States she kept meds down while in the ER but continued to vomit after leaving and has been unable to keep anything down. Denies fevers. No other complaints or concerns.        Home Medications Prior to Admission medications   Medication Sig Start Date End Date Taking? Authorizing Provider  promethazine (PHENERGAN) 25 MG suppository Place 1 suppository (25 mg total) rectally every 6 (six) hours as needed for nausea or vomiting. 03/08/23  Yes Jeannie Fend, PA-C  albuterol (VENTOLIN HFA) 108 (90 Base) MCG/ACT inhaler Inhale 2 puffs into the lungs every 6 (six) hours as needed for wheezing or shortness of breath. 07/08/22   Del Newman Nip, Tenna Child, FNP  amoxicillin (AMOXIL) 500 MG capsule Take 1 capsule (500 mg total) by mouth every 8 (eight) hours. 03/03/23     budesonide-formoterol (SYMBICORT) 80-4.5 MCG/ACT inhaler Inhale 2 puffs into the lungs 2 (two) times daily. 07/08/22   Del Nigel Berthold, FNP  doxycycline (VIBRAMYCIN) 100 MG capsule Take 1 capsule (100 mg total) by mouth 2 (two) times daily. 03/05/23   Jeannie Fend, PA-C  EPINEPHrine 0.3 mg/0.3 mL IJ SOAJ injection Inject into  mid outer thigh 1 time for 1 dose. 12/13/22     hydrOXYzine (ATARAX) 25 MG tablet Take 1 tablet (25 mg total) by mouth 3 (three) times daily as needed for anxiety. 02/12/23   Motley-Mangrum, Geralynn Ochs A, PMHNP  ibuprofen (ADVIL) 600 MG  tablet Take 1 tablet (600 mg total) by mouth a night time as needed. 01/24/23     medroxyPROGESTERone (DEPO-PROVERA) 150 MG/ML injection Inject 1 mL (150 mg total) into the muscle every 3 (three) months. 07/29/22   Adline Potter, NP  metroNIDAZOLE (FLAGYL) 500 MG tablet Take 1 tablet (500 mg total) by mouth 2 (two) times daily. 03/05/23   Jeannie Fend, PA-C  ondansetron (ZOFRAN-ODT) 4 MG disintegrating tablet Take 1 tablet (4 mg total) by mouth every 8 (eight) hours as needed for nausea or vomiting. 03/05/23   Jeannie Fend, PA-C  pantoprazole (PROTONIX) 40 MG tablet Take 1 tablet (40 mg total) by mouth daily. 12/27/22   Meredith Pel, NP  potassium chloride (KLOR-CON) 10 MEQ tablet Take 1 tablet (10 mEq total) by mouth daily. 03/05/23   Jeannie Fend, PA-C  promethazine (PHENERGAN) 25 MG tablet Take 0.5 tablets (12.5 mg total) by mouth every 6 (six) hours as needed for nausea or vomiting. 01/21/23   Jenel Lucks, MD  sertraline (ZOLOFT) 100 MG tablet Take 1 tablet (100 mg total) by mouth daily. 02/12/23   Motley-Mangrum, Ezra Sites, PMHNP  topiramate (TOPAMAX) 25 MG tablet Take 1 tablet (25 mg total) by mouth 2 (two) times daily. 12/11/22     traZODone (DESYREL) 100 MG tablet Take 1 tablet (100 mg total) by mouth every night. 02/12/23   Motley-Mangrum, Ezra Sites, PMHNP  Allergies    Peanut-containing drug products, Pineapple, Shellfish allergy, Strawberry extract, Charentais melon (french melon), and Chocolate    Review of Systems   Review of Systems Negative except as per HPI Physical Exam Updated Vital Signs BP 137/78 (BP Location: Right Arm)   Pulse 91   Temp 97.7 F (36.5 C) (Oral)   Resp 18   Ht 5\' 3"  (1.6 m)   Wt 94.3 kg   SpO2 98%   BMI 36.85 kg/m  Physical Exam Vitals and nursing note reviewed.  Constitutional:      General: She is not in acute distress.    Appearance: She is well-developed. She is not diaphoretic.  HENT:     Head: Normocephalic and  atraumatic.  Pulmonary:     Effort: Pulmonary effort is normal.  Abdominal:     Palpations: Abdomen is soft.     Tenderness: There is abdominal tenderness in the right lower quadrant, suprapubic area and left lower quadrant.  Skin:    General: Skin is warm and dry.     Findings: No erythema or rash.  Neurological:     Mental Status: She is alert and oriented to person, place, and time.  Psychiatric:        Behavior: Behavior normal.     ED Results / Procedures / Treatments   Labs (all labs ordered are listed, but only abnormal results are displayed) Labs Reviewed  COMPREHENSIVE METABOLIC PANEL - Abnormal; Notable for the following components:      Result Value   Potassium 2.9 (*)    Glucose, Bld 103 (*)    BUN <5 (*)    Albumin 3.4 (*)    All other components within normal limits  CBC - Abnormal; Notable for the following components:   Hemoglobin 11.8 (*)    HCT 34.9 (*)    Platelets 420 (*)    All other components within normal limits  LIPASE, BLOOD  HCG, SERUM, QUALITATIVE  URINALYSIS, ROUTINE W REFLEX MICROSCOPIC    EKG None  Radiology US Pelvis Complete  Result Date: 03/08/2023 CLINICAL DATA:  Pelvic pain, recent PID, on antibiotics EXAM: TRANSABDOMINAL AND TRANSVAGINAL ULTRASOUND OF PELVIS DOPPLER ULTRASOUND OF OVARIES TECHNIQUE: Both transabdominal and transvaginal ultrasound examinations of the pelvis were performed. Transabdominal technique was performed for global imaging of the pelvis including uterus, ovaries, adnexal regions, and pelvic cul-de-sac. It was necessary to proceed with endovaginal exam following the transabdominal exam to visualize the uterus, endometrium, ovaries. Color and duplex Doppler ultrasound was utilized to evaluate blood flow to the ovaries. COMPARISON:  CT abdomen and pelvis 03/05/2023 FINDINGS: Uterus Measurements: 5.6 x 1.9 x 3.6 cm = volume: 19.5 mL. No fibroids or other mass visualized. Endometrium Thickness: 1 mm.  No focal  abnormality visualized. Right ovary Measurements: 2.7 x 1.8 x 1.5 cm = volume: 3.7 mL. Normal appearance/no adnexal mass. Left ovary Measurements: 2.3 x 1.2 x 1.5 cm = volume: 2.5 mL. Normal appearance/no adnexal mass. Pulsed Doppler evaluation of both ovaries demonstrates normal low-resistance arterial and venous waveforms. Other findings No abnormal free fluid. IMPRESSION: Unremarkable pelvic ultrasound. Electronically Signed   By: Minerva Fester M.D.   On: 03/08/2023 03:05   US Transvaginal Non-OB  Result Date: 03/08/2023 CLINICAL DATA:  Pelvic pain, recent PID, on antibiotics EXAM: TRANSABDOMINAL AND TRANSVAGINAL ULTRASOUND OF PELVIS DOPPLER ULTRASOUND OF OVARIES TECHNIQUE: Both transabdominal and transvaginal ultrasound examinations of the pelvis were performed. Transabdominal technique was performed for global imaging of the pelvis including uterus, ovaries, adnexal  regions, and pelvic cul-de-sac. It was necessary to proceed with endovaginal exam following the transabdominal exam to visualize the uterus, endometrium, ovaries. Color and duplex Doppler ultrasound was utilized to evaluate blood flow to the ovaries. COMPARISON:  CT abdomen and pelvis 03/05/2023 FINDINGS: Uterus Measurements: 5.6 x 1.9 x 3.6 cm = volume: 19.5 mL. No fibroids or other mass visualized. Endometrium Thickness: 1 mm.  No focal abnormality visualized. Right ovary Measurements: 2.7 x 1.8 x 1.5 cm = volume: 3.7 mL. Normal appearance/no adnexal mass. Left ovary Measurements: 2.3 x 1.2 x 1.5 cm = volume: 2.5 mL. Normal appearance/no adnexal mass. Pulsed Doppler evaluation of both ovaries demonstrates normal low-resistance arterial and venous waveforms. Other findings No abnormal free fluid. IMPRESSION: Unremarkable pelvic ultrasound. Electronically Signed   By: Minerva Fester M.D.   On: 03/08/2023 03:05   [place] Art/Ven Flow Abd Pelv Doppler  Result Date: 03/08/2023 CLINICAL DATA:  Pelvic pain, recent PID, on antibiotics EXAM:  TRANSABDOMINAL AND TRANSVAGINAL ULTRASOUND OF PELVIS DOPPLER ULTRASOUND OF OVARIES TECHNIQUE: Both transabdominal and transvaginal ultrasound examinations of the pelvis were performed. Transabdominal technique was performed for global imaging of the pelvis including uterus, ovaries, adnexal regions, and pelvic cul-de-sac. It was necessary to proceed with endovaginal exam following the transabdominal exam to visualize the uterus, endometrium, ovaries. Color and duplex Doppler ultrasound was utilized to evaluate blood flow to the ovaries. COMPARISON:  CT abdomen and pelvis 03/05/2023 FINDINGS: Uterus Measurements: 5.6 x 1.9 x 3.6 cm = volume: 19.5 mL. No fibroids or other mass visualized. Endometrium Thickness: 1 mm.  No focal abnormality visualized. Right ovary Measurements: 2.7 x 1.8 x 1.5 cm = volume: 3.7 mL. Normal appearance/no adnexal mass. Left ovary Measurements: 2.3 x 1.2 x 1.5 cm = volume: 2.5 mL. Normal appearance/no adnexal mass. Pulsed Doppler evaluation of both ovaries demonstrates normal low-resistance arterial and venous waveforms. Other findings No abnormal free fluid. IMPRESSION: Unremarkable pelvic ultrasound. Electronically Signed   By: Minerva Fester M.D.   On: 03/08/2023 03:05    Procedures Procedures    Medications Ordered in ED Medications  potassium chloride SA (KLOR-CON M) CR tablet 40 mEq (has no administration in time range)  doxycycline (VIBRAMYCIN) 100 mg in dextrose 5 % 250 mL IVPB (0 mg Intravenous Stopped 03/08/23 0342)  Ampicillin-Sulbactam (UNASYN) 3 g in sodium chloride 0.9 % 100 mL IVPB (0 g Intravenous Stopped 03/08/23 0119)  metoCLOPramide (REGLAN) injection 10 mg (10 mg Intravenous Given 03/08/23 0037)  potassium chloride SA (KLOR-CON M) CR tablet 40 mEq (40 mEq Oral Given 03/08/23 0346)  haloperidol lactate (HALDOL) injection 2 mg (2 mg Intravenous Given 03/08/23 0404)  diphenhydrAMINE (BENADRYL) injection 12.5 mg (12.5 mg Intravenous Given 03/08/23 0405)     ED Course/ Medical Decision Making/ A&P                                 Medical Decision Making Amount and/or Complexity of Data Reviewed Labs: ordered. Radiology: ordered.  Risk Prescription drug management.   This patient presents to the ED for concern of ongoing lower abdominal pain and vomiting, this involves an extensive number of treatment options, and is a complaint that carries with it a high risk of complications and morbidity.  The differential diagnosis includes but not limited to PID, TOA, metabolic/electrolyte derangement, sepsis    Co morbidities that complicate the patient evaluation  Asthma, seizures, schizophrenia, anxiety, HTN, PTSD, ODD   Additional history obtained:  External records from outside source obtained and reviewed including prior labs on file for comparison   Lab Tests:  I Ordered, and personally interpreted labs.  The pertinent results include: hCG negative.  Lipase normal.  CBC with normal WBC.  CMP with hypokalemia potassium 2.9.   Imaging Studies ordered:  I ordered imaging studies including [place] torsion study  I independently visualized and interpreted imaging which showed flow to bilateral ovaries, no TOA I agree with the radiologist interpretation   Problem List / ED Course / Critical interventions / Medication management  17 year old female with complaint of on going lower abdominal pain and vomiting. Patient was seen here by me Friday night with same complaint, work up concerning for likely PID. Gonorrhea and chlamydia + on lab review. Patient has been unable to keep meds down at home despite Zofran. On exam tonight, mild lower abdominal tenderness. [place] negative for torsion and TOA. WBC normal, mild hypokalemia which was replaced orally. IV abx provided while in the ER.  I ordered medication including doxycycline, Reglan, Unasyn, potassium for nausea and vomiting, PID, hypokalemia. Patient vomited after taking kdur. She was then  provided with haldol and benadryl for her nausea and vomiting. Patient able to tolerate POs. Plan is for dc with phenergan PR if needed, recheck with PCP, return as needed. May need admission if she returns for ongoing vomiting.  Reevaluation of the patient after these medicines showed that the patient improved I have reviewed the patients home medicines and have made adjustments as needed   Social Determinants of Health:  Has PCP   Test / Admission - Considered:  Stable for dc         Final Clinical Impression(s) / ED Diagnoses Final diagnoses:  Pelvic pain in female  Nausea and vomiting, unspecified vomiting type  Hypokalemia    Rx / DC Orders ED Discharge Orders          Ordered    promethazine (PHENERGAN) 25 MG suppository  Every 6 hours PRN        03/08/23 0604              Jeannie Fend, PA-C 03/08/23 0617    Nira Conn, MD 03/08/23 [phone]

## 2023-03-08 ENCOUNTER — Other Ambulatory Visit (HOSPITAL_COMMUNITY): Payer: Self-pay

## 2023-03-08 MED ORDER — DIPHENHYDRAMINE HCL 50 MG/ML IJ SOLN
12.5000 mg | Freq: Once | INTRAMUSCULAR | Status: AC
  Administered 2023-03-08: 12.5 mg via INTRAVENOUS
  Filled 2023-03-08: qty 1

## 2023-03-08 MED ORDER — PROMETHAZINE HCL 25 MG RE SUPP
25.0000 mg | Freq: Four times a day (QID) | RECTAL | 0 refills | Status: DC | PRN
  Filled 2023-03-08: qty 12, 3d supply, fill #0

## 2023-03-08 MED ORDER — POTASSIUM CHLORIDE CRYS ER 20 MEQ PO TBCR
40.0000 meq | EXTENDED_RELEASE_TABLET | Freq: Once | ORAL | Status: AC
  Administered 2023-03-08: 20 meq via ORAL

## 2023-03-08 MED ORDER — POTASSIUM CHLORIDE CRYS ER 20 MEQ PO TBCR
40.0000 meq | EXTENDED_RELEASE_TABLET | Freq: Once | ORAL | Status: AC
  Administered 2023-03-08: 40 meq via ORAL
  Filled 2023-03-08: qty 2

## 2023-03-08 MED ORDER — HALOPERIDOL LACTATE 5 MG/ML IJ SOLN
2.0000 mg | Freq: Once | INTRAMUSCULAR | Status: AC
  Administered 2023-03-08: 2 mg via INTRAVENOUS
  Filled 2023-03-08: qty 1

## 2023-03-08 NOTE — ED Notes (Signed)
Pt asked for water, followed up & asked nurse

## 2023-03-08 NOTE — ED Notes (Addendum)
Patient called out requesting another emesis bag, patient has gotten sick. Provider notified.

## 2023-03-08 NOTE — ED Notes (Signed)
Patient is resting at this time. Has no complaints or needs expressed.

## 2023-03-08 NOTE — ED Notes (Signed)
Patient is attempting to take all the potassium pills. Broke them In half to manage better. Patient gagged after taking first half. Did not vomit at this time. Still stating she is nauseous

## 2023-03-08 NOTE — Discharge Instructions (Addendum)
Follow up with your PCP for recheck. Return to the ER for fevers, worsening or concerning symptoms. Use phenergan suppositories if zofran is not helping with vomiting. Take antibiotics and potassium supplement as previously prescribed.

## 2023-03-08 NOTE — ED Notes (Signed)
Patient was only able to take on pill from earlier of the potassium. Patient had one pill left at bedside. Got patient to attempt again with taking medication. Patient was able to take pill with no problem at this time. Patient states some nausea but is resting. Will keep check on her to see if she gets sick again.

## 2023-03-08 NOTE — ED Notes (Addendum)
Pt asked for emesis bag

## 2023-03-08 NOTE — ED Notes (Signed)
Attempted to wake patient to check on her status with no success. Patient is sleeping at this time. Was unable to get woken up when calling her name and touching her shoulder.

## 2023-03-08 NOTE — ED Notes (Signed)
Called legal guardian and spoke with her about discharge. She made me aware that the last time they called her about discharge she was unaware of the STIs and that makes sense now with her having the pain and issues. I told her the labs and meds given today and that she needs to continue taking her medication that was prescribed few days prior from her visit. Will inform patient and she stated she was going to call an uber for ride home. Guardian is aware.

## 2023-03-09 LAB — CULTURE, BLOOD (ROUTINE X 2)
Culture: NO GROWTH
Special Requests: ADEQUATE

## 2023-03-09 NOTE — Transitions of Care (Post Inpatient/ED Visit) (Unsigned)
   03/09/2023  Name: Amy Wall MRN: [id number] DOB: 05/25/2005  Today's TOC FU Call Status: Today's TOC FU Call Status:: Unsuccessful Call (2nd Attempt) Unsuccessful Call (1st Attempt) Date: 03/07/23 Unsuccessful Call (2nd Attempt) Date: 03/09/23  Attempted to reach the patient regarding the most recent Inpatient/ED visit.  Follow Up Plan: Additional outreach attempts will be made to reach the patient to complete the Transitions of Care (Post Inpatient/ED visit) call.   Signature Arvil Persons, BSN, Charity fundraiser

## 2023-03-10 NOTE — Transitions of Care (Post Inpatient/ED Visit) (Signed)
   03/10/2023  Name: Amy Wall MRN: [id number] DOB: Sep 01, 2005  Today's TOC FU Call Status: Today's TOC FU Call Status:: Unsuccessful Call (3rd Attempt) Unsuccessful Call (1st Attempt) Date: 03/07/23 Unsuccessful Call (2nd Attempt) Date: 03/09/23 Unsuccessful Call (3rd Attempt) Date: 03/10/23  Attempted to reach the patient regarding the most recent Inpatient/ED visit.  Follow Up Plan: No further outreach attempts will be made at this time. We have been unable to contact the patient.  Signature Arvil Persons, BSN, Charity fundraiser

## 2023-03-16 ENCOUNTER — Ambulatory Visit: Payer: MEDICAID | Admitting: Gastroenterology

## 2023-03-16 ENCOUNTER — Encounter: Payer: Self-pay | Admitting: Gastroenterology

## 2023-03-16 VITALS — BP 92/62 | HR 62 | Temp 97.7°F | Resp 18 | Ht 63.0 in | Wt 212.8 lb

## 2023-03-16 DIAGNOSIS — R112 Nausea with vomiting, unspecified: Secondary | ICD-10-CM

## 2023-03-16 DIAGNOSIS — B9681 Helicobacter pylori [H. pylori] as the cause of diseases classified elsewhere: Secondary | ICD-10-CM

## 2023-03-16 DIAGNOSIS — K2970 Gastritis, unspecified, without bleeding: Secondary | ICD-10-CM

## 2023-03-16 MED ORDER — SODIUM CHLORIDE 0.9 % IV SOLN: 500.0000 mL | Freq: Once | INTRAVENOUS | Status: DC

## 2023-03-16 NOTE — Progress Notes (Signed)
[facility] History and Physical   Primary Care Physician:  Salvatore Decent, FNP   Reason for Procedure:   Abdominal pain, nausea, vomiting  Plan:    EGD     HPI: Amy Wall is a 17 y.o. female undergoing EGD to evaluate chronic abdominal pain, nausea/vomiting, weight loss.  Normal CT, CBC, lipase.  Persistently low potassium.   Past Medical History:  Diagnosis Date   Anxiety    Asthma    DMDD (disruptive mood dysregulation disorder) (HCC)    Epilepsy (HCC)    pseuodoseizures per chart   HTN (hypertension)    Major depressive disorder    Oppositional defiant disorder    PTSD (post-traumatic stress disorder)    Schizophrenia (HCC)    Seizures (HCC)     Past Surgical History:  Procedure Laterality Date   BACK SURGERY     CHOLECYSTECTOMY, LAPAROSCOPIC      Prior to Admission medications   Medication Sig Start Date End Date Taking? Authorizing Provider  albuterol (VENTOLIN HFA) 108 (90 Base) MCG/ACT inhaler Inhale 2 puffs into the lungs every 6 (six) hours as needed for wheezing or shortness of breath. 07/08/22  Yes Del Newman Nip, Tenna Child, FNP  amoxicillin (AMOXIL) 500 MG capsule Take 1 capsule (500 mg total) by mouth every 8 (eight) hours. 03/03/23  Yes   budesonide-formoterol (SYMBICORT) 80-4.5 MCG/ACT inhaler Inhale 2 puffs into the lungs 2 (two) times daily. 07/08/22  Yes Del Newman Nip, Tenna Child, FNP  doxycycline (VIBRAMYCIN) 100 MG capsule Take 1 capsule (100 mg total) by mouth 2 (two) times daily. 03/05/23  Yes Jeannie Fend, PA-C  hydrOXYzine (ATARAX) 25 MG tablet Take 1 tablet (25 mg total) by mouth 3 (three) times daily as needed for anxiety. 02/12/23  Yes Motley-Mangrum, Geralynn Ochs A, PMHNP  metroNIDAZOLE (FLAGYL) 500 MG tablet Take 1 tablet (500 mg total) by mouth 2 (two) times daily. 03/05/23  Yes Jeannie Fend, PA-C  ondansetron (ZOFRAN-ODT) 4 MG disintegrating tablet Take 1 tablet (4 mg total) by mouth every 8 (eight) hours as needed for  nausea or vomiting. 03/05/23  Yes Jeannie Fend, PA-C  pantoprazole (PROTONIX) 40 MG tablet Take 1 tablet (40 mg total) by mouth daily. 12/27/22  Yes Meredith Pel, NP  potassium chloride (KLOR-CON) 10 MEQ tablet Take 1 tablet (10 mEq total) by mouth daily. 03/05/23  Yes Jeannie Fend, PA-C  sertraline (ZOLOFT) 100 MG tablet Take 1 tablet (100 mg total) by mouth daily. 02/12/23  Yes Motley-Mangrum, Geralynn Ochs A, PMHNP  topiramate (TOPAMAX) 25 MG tablet Take 1 tablet (25 mg total) by mouth 2 (two) times daily. 12/11/22  Yes   traZODone (DESYREL) 100 MG tablet Take 1 tablet (100 mg total) by mouth every night. 02/12/23  Yes Motley-Mangrum, Geralynn Ochs A, PMHNP  EPINEPHrine 0.3 mg/0.3 mL IJ SOAJ injection Inject into  mid outer thigh 1 time for 1 dose. 12/13/22     ibuprofen (ADVIL) 600 MG tablet Take 1 tablet (600 mg total) by mouth a night time as needed. 01/24/23     medroxyPROGESTERone (DEPO-PROVERA) 150 MG/ML injection Inject 1 mL (150 mg total) into the muscle every 3 (three) months. 07/29/22   Adline Potter, NP  promethazine (PHENERGAN) 25 MG suppository Place 1 suppository (25 mg total) rectally every 6 (six) hours as needed for nausea or vomiting. 03/08/23   Jeannie Fend, PA-C  promethazine (PHENERGAN) 25 MG tablet Take 0.5 tablets (12.5 mg total) by mouth every 6 (six) hours as needed for  nausea or vomiting. 01/21/23   Jenel Lucks, MD    Current Outpatient Medications  Medication Sig Dispense Refill   albuterol (VENTOLIN HFA) 108 (90 Base) MCG/ACT inhaler Inhale 2 puffs into the lungs every 6 (six) hours as needed for wheezing or shortness of breath. 8 g 2   amoxicillin (AMOXIL) 500 MG capsule Take 1 capsule (500 mg total) by mouth every 8 (eight) hours. 21 capsule 0   budesonide-formoterol (SYMBICORT) 80-4.5 MCG/ACT inhaler Inhale 2 puffs into the lungs 2 (two) times daily. 1 each 12   doxycycline (VIBRAMYCIN) 100 MG capsule Take 1 capsule (100 mg total) by mouth 2 (two) times daily.  20 capsule 0   hydrOXYzine (ATARAX) 25 MG tablet Take 1 tablet (25 mg total) by mouth 3 (three) times daily as needed for anxiety. 30 tablet 0   metroNIDAZOLE (FLAGYL) 500 MG tablet Take 1 tablet (500 mg total) by mouth 2 (two) times daily. 14 tablet 0   ondansetron (ZOFRAN-ODT) 4 MG disintegrating tablet Take 1 tablet (4 mg total) by mouth every 8 (eight) hours as needed for nausea or vomiting. 12 tablet 0   pantoprazole (PROTONIX) 40 MG tablet Take 1 tablet (40 mg total) by mouth daily. 30 tablet 5   potassium chloride (KLOR-CON) 10 MEQ tablet Take 1 tablet (10 mEq total) by mouth daily. 4 tablet 0   sertraline (ZOLOFT) 100 MG tablet Take 1 tablet (100 mg total) by mouth daily. 30 tablet 0   topiramate (TOPAMAX) 25 MG tablet Take 1 tablet (25 mg total) by mouth 2 (two) times daily. 60 tablet 0   traZODone (DESYREL) 100 MG tablet Take 1 tablet (100 mg total) by mouth every night. 30 tablet 0   EPINEPHrine 0.3 mg/0.3 mL IJ SOAJ injection Inject into  mid outer thigh 1 time for 1 dose. 2 each 0   ibuprofen (ADVIL) 600 MG tablet Take 1 tablet (600 mg total) by mouth a night time as needed. 16 tablet 1   medroxyPROGESTERone (DEPO-PROVERA) 150 MG/ML injection Inject 1 mL (150 mg total) into the muscle every 3 (three) months. 1 mL 4   promethazine (PHENERGAN) 25 MG suppository Place 1 suppository (25 mg total) rectally every 6 (six) hours as needed for nausea or vomiting. 12 each 0   promethazine (PHENERGAN) 25 MG tablet Take 0.5 tablets (12.5 mg total) by mouth every 6 (six) hours as needed for nausea or vomiting. 20 tablet 0   Current Facility-Administered Medications  Medication Dose Route Frequency Provider Last Rate Last Admin   0.9 %  sodium chloride infusion  500 mL Intravenous Once Jenel Lucks, MD        Allergies as of 03/16/2023 - Review Complete 03/16/2023  Allergen Reaction Noted   Peanut-containing drug products Anaphylaxis, Hives, and Itching 01/25/2017   Pineapple Anaphylaxis  and Hives 05/20/2020   Shellfish allergy Anaphylaxis and Hives 12/13/2019   Strawberry extract Anaphylaxis and Hives 12/13/2019   Charentais melon (french melon) Swelling 11/12/2022   Chocolate Itching 10/22/2022    Family History  Problem Relation Age of Onset   Allergic rhinitis Father    Eczema Sister    Allergic rhinitis Sister    Colon cancer Neg Hx    Rectal cancer Neg Hx    Stomach cancer Neg Hx    Esophageal cancer Neg Hx     Social History   Socioeconomic History   Marital status: Single    Spouse name: Not on file   Number of children: 0  Years of education: Not on file   Highest education level: Not on file  Occupational History   Not on file  Tobacco Use   Smoking status: Every Day    Types: Cigars    Passive exposure: Yes   Smokeless tobacco: Never   Tobacco comments:    Lack and mild, marijuana  Vaping Use   Vaping status: Every Day  Substance and Sexual Activity   Alcohol use: Yes    Alcohol/week: 4.0 standard drinks of alcohol    Types: 2 Glasses of wine, 2 Shots of liquor per week   Drug use: Not Currently    Types: Marijuana, Methamphetamines    Comment: daily   Sexual activity: Yes    Birth control/protection: Condom  Other Topics Concern   Not on file  Social History Narrative   She lives with her uncle and aunt.   She has six siblings.   Social Determinants of Health   Financial Resource Strain: Medium Risk (06/28/2022)   Overall Financial Resource Strain (CARDIA)    Difficulty of Paying Living Expenses: Somewhat hard  Food Insecurity: No Food Insecurity (09/23/2022)   Hunger Vital Sign    Worried About Running Out of Food in the Last Year: Never true    Ran Out of Food in the Last Year: Never true  Transportation Needs: No Transportation Needs (09/23/2022)   PRAPARE - Administrator, Civil Service (Medical): No    Lack of Transportation (Non-Medical): No  Physical Activity: Inactive (06/28/2022)   Exercise Vital Sign     Days of Exercise per Week: 0 days    Minutes of Exercise per Session: 0 min  Stress: No Stress Concern Present (06/28/2022)   [employer] of Occupational Health - Occupational Stress Questionnaire    Feeling of Stress : Only a little  Social Connections: Moderately Isolated (06/28/2022)   Social Connection and Isolation Panel [NHANES]    Frequency of Communication with Friends and Family: Once a week    Frequency of Social Gatherings with Friends and Family: Once a week    Attends Religious Services: More than 4 times per year    Active Member of [employer] or Organizations: Yes    Attends Banker Meetings: 1 to 4 times per year    Marital Status: Never married  Intimate Partner Violence: Not At Risk (09/23/2022)   Humiliation, Afraid, Rape, and Kick questionnaire    Fear of Current or Ex-Partner: No    Emotionally Abused: No    Physically Abused: No    Sexually Abused: No    Review of Systems:  All other review of systems negative except as mentioned in the HPI.  Physical Exam: Vital signs BP (!) 140/85   Pulse 85   Temp 97.7 F (36.5 C)   Ht 5\' 3"  (1.6 m)   Wt 212 lb 12.8 oz (96.5 kg)   SpO2 99%   BMI 37.70 kg/m   General:   Alert,  Well-developed, well-nourished, pleasant and cooperative in NAD Airway:  Mallampati 2 Lungs:  Clear throughout to auscultation.   Heart:  Regular rate and rhythm; no murmurs, clicks, rubs,  or gallops. Abdomen:  Soft, nontender and nondistended. Normal bowel sounds.   Neuro/Psych:  Normal mood and affect. A and O x 3   Mykelle Cockerell E. Tomasa Rand, MD [facility]

## 2023-03-16 NOTE — Patient Instructions (Signed)
Please read handouts provided. Continue present medications. Await pathology results. Follow-up in office for further management of GI symptoms.   YOU HAD AN ENDOSCOPIC PROCEDURE TODAY AT THE [facility]:   Refer to the procedure report that was given to you for any specific questions about what was found during the examination.  If the procedure report does not answer your questions, please call your gastroenterologist to clarify.  If you requested that your care partner not be given the details of your procedure findings, then the procedure report has been included in a sealed envelope for you to review at your convenience later.  YOU SHOULD EXPECT: Some feelings of bloating in the abdomen. Passage of more gas than usual.  Walking can help get rid of the air that was put into your GI tract during the procedure and reduce the bloating. If you had a lower endoscopy (such as a colonoscopy or flexible sigmoidoscopy) you may notice spotting of blood in your stool or on the toilet paper. If you underwent a bowel prep for your procedure, you may not have a normal bowel movement for a few days.  Please Note:  You might notice some irritation and congestion in your nose or some drainage.  This is from the oxygen used during your procedure.  There is no need for concern and it should clear up in a day or so.  SYMPTOMS TO REPORT IMMEDIATELY:   Following upper endoscopy (EGD)  Vomiting of blood or coffee ground material  New chest pain or pain under the shoulder blades  Painful or persistently difficult swallowing  New shortness of breath  Fever of 100F or higher  Black, tarry-looking stools  For urgent or emergent issues, a gastroenterologist can be reached at any hour by calling [phone]. Do not use MyChart messaging for urgent concerns.    DIET:  We do recommend a small meal at first, but then you may proceed to your regular diet.  Drink plenty of fluids but you should avoid  alcoholic beverages for 24 hours.  ACTIVITY:  You should plan to take it easy for the rest of today and you should NOT DRIVE or use heavy machinery until tomorrow (because of the sedation medicines used during the test).    FOLLOW UP: Our staff will call the number listed on your records the next business day following your procedure.  We will call around 7:15- 8:00 am to check on you and address any questions or concerns that you may have regarding the information given to you following your procedure. If we do not reach you, we will leave a message.     If any biopsies were taken you will be contacted by phone or by letter within the next 1-3 weeks.  Please call us at [phone] if you have not heard about the biopsies in 3 weeks.    SIGNATURES/CONFIDENTIALITY: You and/or your care partner have signed paperwork which will be entered into your electronic medical record.  These signatures attest to the fact that that the information above on your After Visit Summary has been reviewed and is understood.  Full responsibility of the confidentiality of this discharge information lies with you and/or your care-partner.

## 2023-03-16 NOTE — Op Note (Signed)
[facility] Patient Name: Amy Wall Procedure Date: 03/16/2023 10:35 AM MRN: [id number] Endoscopist: Lorin Picket E. Tomasa Rand , MD, [id number] Age: 17 Referring MD:  Date of Birth: [birth year] Gender: Female Account #: [id number] Procedure:                Upper GI endoscopy Indications:              Epigastric abdominal pain, Nausea with vomiting,                            Weight loss Medicines:                Monitored Anesthesia Care Procedure:                Pre-Anesthesia Assessment:                           - Prior to the procedure, a History and Physical                            was performed, and patient medications and                            allergies were reviewed. The patient's tolerance of                            previous anesthesia was also reviewed. The risks                            and benefits of the procedure and the sedation                            options and risks were discussed with the patient.                            All questions were answered, and informed consent                            was obtained. Prior Anticoagulants: The patient has                            taken no anticoagulant or antiplatelet agents. ASA                            Grade Assessment: III - A patient with severe                            systemic disease. After reviewing the risks and                            benefits, the patient was deemed in satisfactory                            condition to undergo the procedure.  After obtaining informed consent, the endoscope was                            passed under direct vision. Throughout the                            procedure, the patient's blood pressure, pulse, and                            oxygen saturations were monitored continuously. The                            GIF [id number] #[id number] was introduced through the                            mouth, and advanced to the third  part of duodenum.                            The upper GI endoscopy was accomplished without                            difficulty. The patient tolerated the procedure                            well. Scope In: Scope Out: Findings:                 The examined portions of the nasopharynx,                            oropharynx and larynx were normal.                           The examined esophagus was normal.                           The entire examined stomach was normal. Biopsies                            were taken with a cold forceps for Helicobacter                            pylori testing.                           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                           The exam of the duodenum was otherwise normal.                           Biopsies for histology were taken with a cold  forceps in the second portion of the duodenum for                            evaluation of celiac disease. Estimated blood loss                            was minimal. Complications:            No immediate complications. Estimated Blood Loss:     Estimated blood loss was minimal. Impression:               - The examined portions of the nasopharynx,                            oropharynx and larynx were normal.                           - Normal esophagus.                           - Normal stomach. Biopsied.                           - Duodenal diverticulum.                           - Biopsies were taken with a cold forceps for                            evaluation of celiac disease.                           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 The signs and symptoms of potential                             delayed complications were discussed with the                            patient. Return to normal activities tomorrow.                            Written discharge instructions were provided to the                            patient.                           - Resume previous diet.                           -  Continue present medications.                           - Await pathology results.                           - Follow up in the office for further management of                            chronic GI symptoms. Tlaloc Taddei E. Tomasa Rand, MD 03/16/2023 10:58:23 AM This report has been signed electronically.

## 2023-03-16 NOTE — Progress Notes (Signed)
Called to room to assist during endoscopic procedure.  Patient ID and intended procedure confirmed with present staff. Received instructions for my participation in the procedure from the performing physician.  

## 2023-03-16 NOTE — Progress Notes (Signed)
Vss nad trans to pacu 

## 2023-03-17 ENCOUNTER — Telehealth: Payer: Self-pay

## 2023-03-17 NOTE — Telephone Encounter (Signed)
LMOM

## 2023-03-17 NOTE — Telephone Encounter (Signed)
Patient's mother and cancelled this morning. She stated that she felt it was too soon after the appointment. Patient's mother also wanted to provide  patient's  psychiatry services  information. Psychotherapeutic services: Team lead: [phone]. Patient's mother rescheduled for 05/11/22 @ 11:10.

## 2023-03-18 ENCOUNTER — Other Ambulatory Visit (HOSPITAL_COMMUNITY): Payer: Self-pay

## 2023-03-18 ENCOUNTER — Ambulatory Visit: Payer: MEDICAID | Admitting: Nurse Practitioner

## 2023-03-21 ENCOUNTER — Other Ambulatory Visit (HOSPITAL_COMMUNITY): Payer: Self-pay

## 2023-03-21 LAB — SURGICAL PATHOLOGY

## 2023-03-22 ENCOUNTER — Other Ambulatory Visit (HOSPITAL_COMMUNITY): Payer: Self-pay

## 2023-03-22 ENCOUNTER — Other Ambulatory Visit: Payer: Self-pay

## 2023-03-22 MED ORDER — BISMUTH 262 MG PO CHEW
524.0000 mg | CHEWABLE_TABLET | Freq: Four times a day (QID) | ORAL | 0 refills | Status: DC
  Filled 2023-03-22: qty 112, 28d supply, fill #0

## 2023-03-22 MED ORDER — PANTOPRAZOLE SODIUM 40 MG PO TBEC
40.0000 mg | DELAYED_RELEASE_TABLET | Freq: Two times a day (BID) | ORAL | 0 refills | Status: DC
  Filled 2023-03-22 – 2023-05-12 (×2): qty 28, 14d supply, fill #0

## 2023-03-22 MED ORDER — DOXYCYCLINE HYCLATE 100 MG PO CAPS
100.0000 mg | ORAL_CAPSULE | Freq: Two times a day (BID) | ORAL | 0 refills | Status: DC
  Filled 2023-03-22 – 2023-05-12 (×2): qty 28, 14d supply, fill #0

## 2023-03-22 MED ORDER — METRONIDAZOLE 250 MG PO TABS
250.0000 mg | ORAL_TABLET | Freq: Three times a day (TID) | ORAL | 0 refills | Status: DC
  Filled 2023-03-22 – 2023-05-12 (×2): qty 42, 14d supply, fill #0

## 2023-03-22 NOTE — Progress Notes (Signed)
Amy Wall,  The biopsies from the recent upper GI Endoscopy were notable for H. Pylori gastritis.  This can be a cause of chronic abdominal pain, nausea and vomiting, and your symptoms might improve with eradication of the bacteria.  Will plan on treating with quad therapy as below. It is very important to take the medications as prescribed and not miss doses, as the bacteria can be very difficult to eradicate.  Please confirm no medication allergies to the prescribed regimen.    1) Pantoprazole 40 mg 2 times a day x 14 d (you are currently prescribed to take this once a day, but should take twice daily while taking the anitbiotics) 2) Pepto Bismol 2 tabs (262 mg each) 4 times a day x 14 d 3) Metronidazole 250 mg 4 times a day x 14 d 4) doxycycline 100 mg 2 times a day x 14 d  4 weeks after treatment completed, check H. Pylori stool antigen to confirm eradication (must be off pantoprazole for 2 weeks prior to specimen submission)  The biopsies taken from you duodenum were unremarkable, with no evidence of celiac disease

## 2023-04-04 ENCOUNTER — Other Ambulatory Visit (HOSPITAL_COMMUNITY): Payer: Self-pay

## 2023-04-14 ENCOUNTER — Ambulatory Visit: Payer: MEDICAID | Admitting: Allergy & Immunology

## 2023-04-18 ENCOUNTER — Emergency Department (HOSPITAL_COMMUNITY): Payer: MEDICAID

## 2023-04-18 ENCOUNTER — Emergency Department (HOSPITAL_COMMUNITY)
Admission: EM | Admit: 2023-04-18 | Discharge: 2023-04-18 | Disposition: A | Discharge status: Home / Self Care | Payer: MEDICAID | Attending: Emergency Medicine | Admitting: Emergency Medicine

## 2023-04-18 ENCOUNTER — Other Ambulatory Visit: Payer: Self-pay

## 2023-04-18 DIAGNOSIS — Z3202 Encounter for pregnancy test, result negative: Secondary | ICD-10-CM

## 2023-04-18 DIAGNOSIS — Z91010 Allergy to peanuts: Secondary | ICD-10-CM | POA: Insufficient documentation

## 2023-04-18 DIAGNOSIS — M79641 Pain in right hand: Secondary | ICD-10-CM | POA: Diagnosis present

## 2023-04-18 LAB — HCG, SERUM, QUALITATIVE: Preg, Serum: NEGATIVE
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
## 2023-04-18 NOTE — Discharge Instructions (Signed)
Your x-ray today was negative, there is no evidence of any kind of fracture on your x-ray.  Additionally your pregnancy test was negative.  Please follow-up with orthopedic surgery as needed, if your hand persistently hurts, for repeat x-rays, to reevaluate and ensure no fracture that did not show up on today's exam
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
## 2023-04-18 NOTE — Progress Notes (Signed)
Orthopedic Tech Progress Note Patient Details:  Amy Wall [birth year] [id number]  Gave pt an ulnar gutter splint, for stability. Ortho Devices Type of Ortho Device: Ulna gutter splint Ortho Device/Splint Location: RUE Ortho Device/Splint Interventions: Ordered, Application, Adjustment   Post Interventions Patient Tolerated: Well Instructions Provided: Care of device, Adjustment of device  Sherilyn Banker 04/18/2023, 3:51 PM

## 2023-04-19 ENCOUNTER — Telehealth: Payer: Self-pay

## 2023-04-19 NOTE — Transitions of Care (Post Inpatient/ED Visit) (Signed)
   04/19/2023  Name: Amy Wall MRN: [id number] DOB: 11-08-2005  Today's TOC FU Call Status:   Patient's Name and Date of Birth confirmed.  Transition Care Management Follow-up Telephone Call Discharge Facility: [facility]) Type of Discharge: Emergency Department How have you been since you were released from the hospital?: Better Any questions or concerns?: No  Items Reviewed: Did you receive and understand the discharge instructions provided?: Yes Any new allergies since your discharge?: No Dietary orders reviewed?: NA  Medications Reviewed Today: Medications Reviewed Today   Medications were not reviewed in this encounter     Home Care and Equipment/Supplies: Were Home Health Services Ordered?: NA Any new equipment or medical supplies ordered?: NA  Functional Questionnaire: Do you need assistance with bathing/showering or dressing?: No Do you need assistance with meal preparation?: No Do you need assistance with eating?: No Do you have difficulty maintaining continence: No Do you need assistance with getting out of bed/getting out of a chair/moving?: No Do you have difficulty managing or taking your medications?: No  Follow up appointments reviewed: PCP Follow-up appointment confirmed?: No Specialist Hospital Follow-up appointment confirmed?: No Do you need transportation to your follow-up appointment?: No Do you understand care options if your condition(s) worsen?: Yes-patient verbalized understanding    SIGNATURE Arvil Persons, BSN, RN

## 2023-04-20 NOTE — Progress Notes (Unsigned)
Subjective:   Amy Wall 20-Mar-2006  04/21/2023   CC: No chief complaint on file.   HPI: Amy Wall is a 17 y.o. female who presents for a routine health maintenance exam.  Labs collected at time of visit.    HEALTH SCREENINGS: - Pap smear: {Blank single:19197::"pap done","not applicable","up to date","done elsewhere"} - Mammogram (40+): Not applicable  - Colonoscopy (45+): Not applicable  - Bone Density (65+): Not applicable  - Lung CA screening with low-dose CT:  Not applicable Adults age 12-80 who are current cigarette smokers or quit within the last 15 years. Must have 20 pack year history.   Depression and Anxiety Screen done today and results listed below:     01/20/2023    2:21 PM 08/23/2022    3:40 AM 07/08/2022    3:37 PM 06/28/2022    3:34 PM 04/01/2020    4:32 PM  Depression screen PHQ 2/9  Decreased Interest 0  0 1 2  Down, Depressed, Hopeless 2  1 1  0  PHQ - 2 Score 2  1 2 2   Altered sleeping 3  0 0 2  Tired, decreased energy 2  1 3 1   Change in appetite 1  2 1 2   Feeling bad or failure about yourself  0  0 2 1  Trouble concentrating 0  0 0 2  Moving slowly or fidgety/restless 0  0 0 0  Suicidal thoughts 0  0 0 0  PHQ-9 Score 8  4 8 10   Difficult doing work/chores Not difficult at all  Not difficult at all       Information is confidential and restricted. Go to Review Flowsheets to unlock data.      01/20/2023    2:22 PM 07/08/2022    3:37 PM 06/28/2022    3:35 PM 03/19/2020    9:09 PM  GAD 7 : Generalized Anxiety Score  Nervous, Anxious, on Edge 2 1 1    Control/stop worrying 0 0 0   Worry too much - different things 0 0 0   Trouble relaxing 0 0 0   Restless 0 0 0   Easily annoyed or irritable 0 0 0   Afraid - awful might happen 0 0 0   Total GAD 7 Score 2 1 1    Anxiety Difficulty Not difficult at all Not difficult at all       Information is confidential and restricted. Go to Review Flowsheets to unlock data.     IMMUNIZATIONS: - Tdap: Tetanus vaccination status reviewed: last tetanus booster within 10 years. - HPV: {Blank single:19197::"Up to date","Administered today","Not applicable","Refused","Given elsewhere"} - Influenza: {Blank single:19197::"Up to date","Administered today","Postponed to flu season","Refused","Given elsewhere"}    Past medical history, surgical history, medications, allergies, family history and social history reviewed with patient today and changes made to appropriate areas of the chart.   Past Medical History:  Diagnosis Date   Anxiety    Asthma    DMDD (disruptive mood dysregulation disorder) (HCC)    Epilepsy (HCC)    pseuodoseizures per chart   HTN (hypertension)    Major depressive disorder    Oppositional defiant disorder    PTSD (post-traumatic stress disorder)    Schizophrenia (HCC)    Seizures (HCC)     Past Surgical History:  Procedure Laterality Date   BACK SURGERY     CHOLECYSTECTOMY, LAPAROSCOPIC      Current Outpatient Medications on File Prior to Visit  Medication Sig   albuterol (VENTOLIN HFA) 108 (  90 Base) MCG/ACT inhaler Inhale 2 puffs into the lungs every 6 (six) hours as needed for wheezing or shortness of breath.   amoxicillin (AMOXIL) 500 MG capsule Take 1 capsule (500 mg total) by mouth every 8 (eight) hours.   Bismuth 262 MG CHEW Chew 1 tablet by mouth in the morning, at noon, in the evening, and at bedtime.   budesonide-formoterol (SYMBICORT) 80-4.5 MCG/ACT inhaler Inhale 2 puffs into the lungs 2 (two) times daily.   doxycycline (VIBRAMYCIN) 100 MG capsule Take 1 capsule (100 mg total) by mouth 2 (two) times daily.   EPINEPHrine 0.3 mg/0.3 mL IJ SOAJ injection Inject into  mid outer thigh 1 time for 1 dose.   hydrOXYzine (ATARAX) 25 MG tablet Take 1 tablet (25 mg total) by mouth 3 (three) times daily as needed for anxiety.   ibuprofen (ADVIL) 600 MG tablet Take 1 tablet (600 mg total) by mouth a night time as needed.    medroxyPROGESTERone (DEPO-PROVERA) 150 MG/ML injection Inject 1 mL (150 mg total) into the muscle every 3 (three) months.   metroNIDAZOLE (FLAGYL) 250 MG tablet Take 1 tablet (250 mg total) by mouth 3 (three) times daily.   metroNIDAZOLE (FLAGYL) 500 MG tablet Take 1 tablet (500 mg total) by mouth 2 (two) times daily.   ondansetron (ZOFRAN-ODT) 4 MG disintegrating tablet Take 1 tablet (4 mg total) by mouth every 8 (eight) hours as needed for nausea or vomiting.   pantoprazole (PROTONIX) 40 MG tablet Take 1 tablet (40 mg total) by mouth daily.   pantoprazole (PROTONIX) 40 MG tablet Take 1 tablet (40 mg total) by mouth 2 (two) times daily before a meal.   potassium chloride (KLOR-CON) 10 MEQ tablet Take 1 tablet (10 mEq total) by mouth daily.   promethazine (PHENERGAN) 25 MG suppository Place 1 suppository (25 mg total) rectally every 6 (six) hours as needed for nausea or vomiting.   promethazine (PHENERGAN) 25 MG tablet Take 0.5 tablets (12.5 mg total) by mouth every 6 (six) hours as needed for nausea or vomiting.   sertraline (ZOLOFT) 100 MG tablet Take 1 tablet (100 mg total) by mouth daily.   topiramate (TOPAMAX) 25 MG tablet Take 1 tablet (25 mg total) by mouth 2 (two) times daily.   traZODone (DESYREL) 100 MG tablet Take 1 tablet (100 mg total) by mouth every night.   No current facility-administered medications on file prior to visit.    Allergies  Allergen Reactions   Peanut-Containing Drug Products Anaphylaxis, Hives and Itching   Pineapple Anaphylaxis and Hives   Shellfish Allergy Anaphylaxis and Hives   Strawberry Extract Anaphylaxis and Hives   Charentais Melon (French Melon) Swelling    Other Reaction(s): Other (see comments)  Numbness and swelling of throat and tongue.   Chocolate Itching    Throat close     Social History   Socioeconomic History   Marital status: Single    Spouse name: Not on file   Number of children: 0   Years of education: Not on file   Highest  education level: Not on file  Occupational History   Not on file  Tobacco Use   Smoking status: Every Day    Types: Cigars    Passive exposure: Yes   Smokeless tobacco: Never   Tobacco comments:    Lack and mild, marijuana  Vaping Use   Vaping status: Every Day  Substance and Sexual Activity   Alcohol use: Yes    Alcohol/week: 4.0 standard drinks of alcohol  Types: 2 Glasses of wine, 2 Shots of liquor per week   Drug use: Not Currently    Types: Marijuana, Methamphetamines    Comment: daily   Sexual activity: Yes    Birth control/protection: Condom  Other Topics Concern   Not on file  Social History Narrative   She lives with her uncle and aunt.   She has six siblings.   Social Determinants of Health   Financial Resource Strain: Medium Risk (06/28/2022)   Overall Financial Resource Strain (CARDIA)    Difficulty of Paying Living Expenses: Somewhat hard  Food Insecurity: No Food Insecurity (09/23/2022)   Hunger Vital Sign    Worried About Running Out of Food in the Last Year: Never true    Ran Out of Food in the Last Year: Never true  Transportation Needs: No Transportation Needs (09/23/2022)   PRAPARE - Administrator, Civil Service (Medical): No    Lack of Transportation (Non-Medical): No  Physical Activity: Inactive (06/28/2022)   Exercise Vital Sign    Days of Exercise per Week: 0 days    Minutes of Exercise per Session: 0 min  Stress: No Stress Concern Present (06/28/2022)   [employer] of Occupational Health - Occupational Stress Questionnaire    Feeling of Stress : Only a little  Social Connections: Moderately Isolated (06/28/2022)   Social Connection and Isolation Panel [NHANES]    Frequency of Communication with Friends and Family: Once a week    Frequency of Social Gatherings with Friends and Family: Once a week    Attends Religious Services: More than 4 times per year    Active Member of [employer] or Organizations: Yes    Attends Tax inspector Meetings: 1 to 4 times per year    Marital Status: Never married  Intimate Partner Violence: Not At Risk (09/23/2022)   Humiliation, Afraid, Rape, and Kick questionnaire    Fear of Current or Ex-Partner: No    Emotionally Abused: No    Physically Abused: No    Sexually Abused: No   Social History   Tobacco Use  Smoking Status Every Day   Types: Cigars   Passive exposure: Yes  Smokeless Tobacco Never  Tobacco Comments   Lack and mild, marijuana   Social History   Substance and Sexual Activity  Alcohol Use Yes   Alcohol/week: 4.0 standard drinks of alcohol   Types: 2 Glasses of wine, 2 Shots of liquor per week    Family History  Problem Relation Age of Onset   Allergic rhinitis Father    Eczema Sister    Allergic rhinitis Sister    Colon cancer Neg Hx    Rectal cancer Neg Hx    Stomach cancer Neg Hx    Esophageal cancer Neg Hx      ROS: Denies fever, fatigue, unexplained weight loss/gain, hearing or vision changes, cardiac or respiratory complaints. Denies neurological deficits, musculoskeletal complaints, gastrointestinal or genitourinary complaints, mental health complaints, and skin changes.   Objective:   There were no vitals filed for this visit.  GENERAL APPEARANCE: Well-appearing, in NAD. Well nourished.  SKIN: Pink, warm and dry. Turgor normal. No rash, lesion, ulceration, or ecchymoses. Hair evenly distributed.  HEENT: HEAD: Normocephalic.  EYES: PERRLA. EOMI. Lids intact w/o defect. Sclera white, Conjunctiva pink w/o exudate.  EARS: External ear w/o redness, swelling, masses or lesions. EAC clear. TM's intact, translucent w/o bulging, appropriate landmarks visualized. Appropriate acuity to conversational tones.  NOSE: Septum midline w/o deformity. Nares  patent, mucosa pink and non-inflamed w/o drainage. No sinus tenderness.  THROAT: Uvula midline. Oropharynx clear. Tonsils non-inflamed w/o exudate ***. Oral mucosa pink and moist.  NECK: Supple,  Trachea midline. Full ROM w/o pain or tenderness. No lymphadenopathy. Thyroid non-tender w/o enlargement or palpable masses.  BREASTS: Breasts pendulous, symmetrical, and w/o palpable masses. Nipples everted and w/o discharge. No rash or skin retraction. No axillary or supraclavicular lymphadenopathy.  RESPIRATORY: Chest wall symmetrical w/o masses. Respirations even and non-labored. Breath sounds clear to auscultation bilaterally. No wheezes, rales, rhonchi, or crackles. CARDIAC: S1, S2 present, regular rate and rhythm. No gallops, murmurs, rubs, or clicks. No carotid bruits. Capillary refill <2 seconds. Peripheral pulses 2+ bilaterally. GI: Abdomen soft w/o distention. Normoactive bowel sounds. No palpable masses or tenderness. No guarding or rebound tenderness. Liver and spleen w/o tenderness or enlargement. No CVA tenderness.  GU: *** External genitalia without erythema, lesions, or masses. No lymphadenopathy. Vaginal mucosa pink and moist without exudate, lesions, or ulcerations. Cervix pink without discharge. Cervical os closed. Uterus and adnexae palpable, not enlarged, and w/o tenderness. No palpable masses.  MSK: Muscle tone and strength appropriate for age, w/o atrophy or abnormal movement.  EXTREMITIES: Active ROM intact, w/o tenderness, crepitus, or contracture. No obvious joint deformities or effusions. No clubbing, edema, or cyanosis.  NEUROLOGIC: CN's II-XII intact. Motor strength symmetrical with no obvious weakness. No sensory deficits. DTR's 2+ symmetric bilaterally. Steady, even gait.  PSYCH/MENTAL STATUS: Alert, oriented x 3. Cooperative, appropriate mood and affect.   Chaperoned by Mary Sella CMA ***  Results for orders placed or performed during the hospital encounter of 04/18/23  hCG, serum, qualitative  Result Value Ref Range   Preg, Serum NEGATIVE NEGATIVE    Assessment & Plan:  There are no diagnoses linked to this encounter.  No orders of the defined types were  placed in this encounter.   PATIENT COUNSELING:  - Encouraged a healthy well-balanced diet. Patient may adjust caloric intake to maintain or achieve ideal body weight. May reduce intake of dietary saturated fat and total fat and have adequate dietary potassium and calcium preferably from fresh fruits, vegetables, and low-fat dairy products.   - Advised to avoid cigarette smoking. - Discussed with the patient that most people either abstain from alcohol or drink within safe limits (<=14/week and <=4 drinks/occasion for males, <=7/weeks and <= 3 drinks/occasion for females) and that the risk for alcohol disorders and other health effects rises proportionally with the number of drinks per week and how often a drinker exceeds daily limits. - Discussed cessation/primary prevention of drug use and availability of treatment for abuse.  - Discussed sexually transmitted diseases, avoidance of unintended pregnancy and contraceptive alternatives.  - Stressed the importance of regular exercise - Injury prevention: Discussed safety belts, safety helmets, smoke detector, smoking near bedding or upholstery.  - Dental health: Discussed importance of regular tooth brushing, flossing, and dental visits.   NEXT PREVENTATIVE PHYSICAL DUE IN 1 YEAR.  No follow-ups on file.  Salvatore Decent, FNP

## 2023-04-21 ENCOUNTER — Telehealth: Payer: Self-pay | Admitting: Internal Medicine

## 2023-04-21 ENCOUNTER — Encounter: Payer: MEDICAID | Admitting: Internal Medicine

## 2023-04-21 DIAGNOSIS — Z124 Encounter for screening for malignant neoplasm of cervix: Secondary | ICD-10-CM

## 2023-04-21 DIAGNOSIS — Z0000 Encounter for general adult medical examination without abnormal findings: Secondary | ICD-10-CM

## 2023-04-21 DIAGNOSIS — Z23 Encounter for immunization: Secondary | ICD-10-CM

## 2023-04-21 NOTE — Telephone Encounter (Signed)
Pt did not show up for appt today. Pls advise.

## 2023-04-22 NOTE — Telephone Encounter (Signed)
1st no show. Letter sent via mychart and sent text.

## 2023-05-12 ENCOUNTER — Other Ambulatory Visit (HOSPITAL_COMMUNITY): Payer: Self-pay

## 2023-05-12 ENCOUNTER — Ambulatory Visit: Payer: MEDICAID | Admitting: Gastroenterology

## 2023-05-12 ENCOUNTER — Other Ambulatory Visit: Payer: MEDICAID

## 2023-05-12 VITALS — BP 112/74 | HR 59 | Ht 63.0 in | Wt 199.0 lb

## 2023-05-12 DIAGNOSIS — R112 Nausea with vomiting, unspecified: Secondary | ICD-10-CM

## 2023-05-12 DIAGNOSIS — R109 Unspecified abdominal pain: Secondary | ICD-10-CM

## 2023-05-12 DIAGNOSIS — F1290 Cannabis use, unspecified, uncomplicated: Secondary | ICD-10-CM

## 2023-05-12 DIAGNOSIS — B9681 Helicobacter pylori [H. pylori] as the cause of diseases classified elsewhere: Secondary | ICD-10-CM | POA: Diagnosis not present

## 2023-05-12 DIAGNOSIS — G8929 Other chronic pain: Secondary | ICD-10-CM

## 2023-05-12 NOTE — Patient Instructions (Addendum)
 1) Pantoprazole  40 mg 2 times a day for 14 days  (you are currently prescribed to take this once a day, but should take twice daily while taking the anitbiotics) 2) Pepto Bismol 2 tablets (262 mg each) 4 times a day for 14 days  3) Metronidazole  250 mg 4 times a day for 14 days  4) doxycycline  100 mg 2 times a day for 14 days   Submit H pylori stool test four weeks after completing the treatment above.  Your provider has requested that you go to the basement level for lab work before leaving today. Press B on the elevator. The lab is located at the first door on the left as you exit the elevator.   _______________________________________________________  If your blood pressure at your visit was 140/90 or greater, please contact your primary care physician to follow up on this.  _______________________________________________________  If you are age 50 or older, your body mass index should be between 23-30. Your Body mass index is 35.25 kg/m. If this is out of the aforementioned range listed, please consider follow up with your Primary Care Provider.  If you are age 4 or younger, your body mass index should be between 19-25. Your Body mass index is 35.25 kg/m. If this is out of the aformentioned range listed, please consider follow up with your Primary Care Provider.   ________________________________________________________  The [facility] providers would like to encourage you to use MYCHART to communicate with providers for non-urgent requests or questions.  Due to long hold times on the telephone, sending your provider a message by [facility] may be a faster and more efficient way to get a response.  Please allow 48 business hours for a response.  Please remember that this is for non-urgent requests.   It was a pleasure to see you today!  Thank you for trusting me with your gastrointestinal care!    Scott E.Stacia, MD

## 2023-05-12 NOTE — Progress Notes (Signed)
 HPI : Amy Wall is a 18 y.o. female with a history of PTSD, depression, schizophrenia and oppositional defiant disorder who presents for follow-up of chronic abdominal pain, nausea and vomiting.  She was initially seen in our office by Vina Dasen in August of last year.  She was recommended to stop using marijuana, prescribed pantoprazole , advised to take daily fiber supplements for her diarrhea.  She continued to experience severe symptoms, and thus underwent an upper endoscopy on November 6 which was notable for a duodenal diverticulum, but was otherwise unremarkable endoscopically.  Gastric biopsies were positive for H. pylori.  She was prescribed quadruple therapy, but we were unable to reach the patient.  A letter was sent to her informing her of these results and recommendations.  Today, the patient reports her symptoms are the same.  She has upper abdominal pain that is constant and never goes away.  She continues to vomit frequently she states she has no appetite and does not eat.  She is currently not taking any medications.  Her social worker accompanies her in the clinic today.  She apparently has prescriptions for Zoloft , Deseril, Abilify  and trazodone , but has not taken these medications.  She states that antiemetics were not very helpful for her, so she has not taken them either.  She continues to smoke marijuana, but is vague about the details of the frequency.   EGD Mar 16, 2023 - The examined portions of the nasopharynx, oropharynx and larynx were normal.  - Normal esophagus.  - Normal stomach. Biopsied.  - Duodenal diverticulum.  - Biopsies were taken with a cold forceps for evaluation of celiac disease.  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   Past Medical History:  Diagnosis Date   Anxiety    Asthma    DMDD (disruptive mood dysregulation disorder) (HCC)    Epilepsy (HCC)    pseuodoseizures per  chart   HTN (hypertension)    Major depressive disorder    Oppositional defiant disorder    PTSD (post-traumatic stress disorder)    Schizophrenia (HCC)    Seizures (HCC)      Past Surgical History:  Procedure Laterality Date   BACK SURGERY     CHOLECYSTECTOMY, LAPAROSCOPIC     Family History  Problem Relation Age of Onset   Allergic rhinitis Father    Eczema Sister    Allergic rhinitis Sister    Colon cancer Neg Hx    Rectal cancer Neg Hx    Stomach cancer Neg Hx    Esophageal cancer Neg Hx    Social History   Tobacco Use   Smoking status: Every Day    Types: Cigars    Passive exposure: Yes   Smokeless tobacco: Never   Tobacco comments:    Lack and mild, marijuana  Vaping Use   Vaping status: Every Day  Substance Use Topics   Alcohol use: Yes    Alcohol/week: 4.0 standard drinks of alcohol    Types: 2 Glasses of wine, 2 Shots of liquor per week   Drug use: Not Currently    Types: Marijuana, Methamphetamines    Comment: daily   Current Outpatient Medications  Medication Sig Dispense Refill   albuterol  (VENTOLIN  HFA) 108 (90 Base) MCG/ACT inhaler Inhale 2 puffs into the lungs every 6 (six) hours as needed for wheezing or shortness of breath. 8 g 2   amoxicillin  (AMOXIL ) 500 MG capsule Take 1 capsule (500 mg total)  by mouth every 8 (eight) hours. 21 capsule 0   Bismuth  262 MG CHEW Chew 1 tablet by mouth in the morning, at noon, in the evening, and at bedtime. 112 tablet 0   budesonide -formoterol  (SYMBICORT ) 80-4.5 MCG/ACT inhaler Inhale 2 puffs into the lungs 2 (two) times daily. 1 each 12   doxycycline  (VIBRAMYCIN ) 100 MG capsule Take 1 capsule (100 mg total) by mouth 2 (two) times daily. 28 capsule 0   EPINEPHrine  0.3 mg/0.3 mL IJ SOAJ injection Inject into  mid outer thigh 1 time for 1 dose. 2 each 0   hydrOXYzine  (ATARAX ) 25 MG tablet Take 1 tablet (25 mg total) by mouth 3 (three) times daily as needed for anxiety. 30 tablet 0   ibuprofen  (ADVIL ) 600 MG tablet  Take 1 tablet (600 mg total) by mouth a night time as needed. 16 tablet 1   medroxyPROGESTERone  (DEPO-PROVERA ) 150 MG/ML injection Inject 1 mL (150 mg total) into the muscle every 3 (three) months. 1 mL 4   metroNIDAZOLE  (FLAGYL ) 250 MG tablet Take 1 tablet (250 mg total) by mouth 3 (three) times daily. 42 tablet 0   metroNIDAZOLE  (FLAGYL ) 500 MG tablet Take 1 tablet (500 mg total) by mouth 2 (two) times daily. 14 tablet 0   ondansetron  (ZOFRAN -ODT) 4 MG disintegrating tablet Take 1 tablet (4 mg total) by mouth every 8 (eight) hours as needed for nausea or vomiting. 12 tablet 0   pantoprazole  (PROTONIX ) 40 MG tablet Take 1 tablet (40 mg total) by mouth daily. 30 tablet 5   pantoprazole  (PROTONIX ) 40 MG tablet Take 1 tablet (40 mg total) by mouth 2 (two) times daily before a meal. 28 tablet 0   potassium chloride  (KLOR-CON ) 10 MEQ tablet Take 1 tablet (10 mEq total) by mouth daily. 4 tablet 0   promethazine  (PHENERGAN ) 25 MG suppository Place 1 suppository (25 mg total) rectally every 6 (six) hours as needed for nausea or vomiting. 12 each 0   promethazine  (PHENERGAN ) 25 MG tablet Take 0.5 tablets (12.5 mg total) by mouth every 6 (six) hours as needed for nausea or vomiting. 20 tablet 0   sertraline  (ZOLOFT ) 100 MG tablet Take 1 tablet (100 mg total) by mouth daily. 30 tablet 0   topiramate  (TOPAMAX ) 25 MG tablet Take 1 tablet (25 mg total) by mouth 2 (two) times daily. 60 tablet 0   traZODone  (DESYREL ) 100 MG tablet Take 1 tablet (100 mg total) by mouth every night. 30 tablet 0   No current facility-administered medications for this visit.   Allergies  Allergen Reactions   Peanut-Containing Drug Products Anaphylaxis, Hives and Itching   Pineapple Anaphylaxis and Hives   Shellfish Allergy Anaphylaxis and Hives   Strawberry Extract Anaphylaxis and Hives   Charentais Melon (French Melon) Swelling    Other Reaction(s): Other (see comments)  Numbness and swelling of throat and tongue.    Chocolate Itching    Throat close     Review of Systems: All systems reviewed and negative except where noted in HPI.    DG Wrist Complete Right Result Date: 04/18/2023 CLINICAL DATA:  Right hand pain after fall EXAM: RIGHT WRIST - COMPLETE 2 VIEW; RIGHT HAND - COMPLETE 2 VIEW COMPARISON:  Right hand radiograph dated 11/03/2020 FINDINGS: There is no evidence of fracture or dislocation. There is no evidence of arthropathy or other focal bone abnormality. Soft tissues are unremarkable. IMPRESSION: No acute fracture or dislocation. Electronically Signed   By: Limin  Xu M.D.   On: 04/18/2023  13:43   DG Hand Complete Right Result Date: 04/18/2023 CLINICAL DATA:  Right hand pain after fall EXAM: RIGHT WRIST - COMPLETE 2 VIEW; RIGHT HAND - COMPLETE 2 VIEW COMPARISON:  Right hand radiograph dated 11/03/2020 FINDINGS: There is no evidence of fracture or dislocation. There is no evidence of arthropathy or other focal bone abnormality. Soft tissues are unremarkable. IMPRESSION: No acute fracture or dislocation. Electronically Signed   By: Limin  Xu M.D.   On: 04/18/2023 13:43    Physical Exam: BP 112/74   Pulse (!) 59   Ht 5' 3 (1.6 m)   Wt 199 lb (90.3 kg)   SpO2 97%   BMI 35.25 kg/m  Constitutional: Pleasant,well-developed, obese African-American female in no acute distress.  Accompanied by social worker Helon) HEENT: Normocephalic and atraumatic. Conjunctivae are normal. No scleral icterus. Neck supple.  Cardiovascular: Normal rate, regular rhythm.  Pulmonary/chest: Effort normal and breath sounds normal. No wheezing, rales or rhonchi. Abdominal: Soft, nondistended, tenderness to palpation in epigastrium and left upper quadrant, without rigidity or guarding. Bowel sounds active throughout. There are no masses palpable. No hepatomegaly. Extremities: no edema Neurological: Alert and oriented to person place and time. Skin: Skin is warm and dry. No rashes noted. Psychiatric: Flat affect.   Patient seemed disinterested and disengaged  CBC    Component Value Date/Time   WBC 6.6 03/07/2023 2016   RBC 4.09 03/07/2023 2016   HGB 11.8 (L) 03/07/2023 2016   HGB 10.6 (L) 07/12/2022 0952   HCT 34.9 (L) 03/07/2023 2016   HCT 32.9 (L) 07/12/2022 0952   PLT 420 (H) 03/07/2023 2016   PLT 492 (H) 07/12/2022 0952   MCV 85.3 03/07/2023 2016   MCV 81 07/12/2022 0952   MCH 28.9 03/07/2023 2016   MCHC 33.8 03/07/2023 2016   RDW 12.8 03/07/2023 2016   RDW 14.3 07/12/2022 0952   LYMPHSABS 3.2 02/10/2023 0400   LYMPHSABS 2.8 07/12/2022 0952   MONOABS 0.8 02/10/2023 0400   EOSABS 0.1 02/10/2023 0400   EOSABS 0.0 07/12/2022 0952   BASOSABS 0.0 02/10/2023 0400   BASOSABS 0.0 07/12/2022 0952    CMP     Component Value Date/Time   NA 137 03/07/2023 2016   NA 141 07/12/2022 0952   K 2.9 (L) 03/07/2023 2016   CL 102 03/07/2023 2016   CO2 25 03/07/2023 2016   GLUCOSE 103 (H) 03/07/2023 2016   BUN <5 (L) 03/07/2023 2016   BUN 11 07/12/2022 0952   CREATININE 0.53 03/07/2023 2016   CALCIUM  9.0 03/07/2023 2016   PROT 7.9 03/07/2023 2016   PROT 7.0 07/12/2022 0952   ALBUMIN 3.4 (L) 03/07/2023 2016   ALBUMIN 3.9 (L) 07/12/2022 0952   AST 18 03/07/2023 2016   ALT 20 03/07/2023 2016   ALKPHOS 90 03/07/2023 2016   BILITOT 0.5 03/07/2023 2016   BILITOT 0.2 07/12/2022 0952   GFRNONAA >60 03/07/2023 2016   GFRAA NOT CALCULATED 12/12/2019 1614       Latest Ref Rng & Units 03/07/2023    8:16 PM 03/04/2023    8:50 PM 02/10/2023    4:00 AM  CBC EXTENDED  WBC 4.0 - 10.5 K/uL 6.6  10.4  10.2   RBC 3.87 - 5.11 MIL/uL 4.09  4.02  3.96   Hemoglobin 12.0 - 15.0 g/dL 88.1  88.6  88.7   HCT 36.0 - 46.0 % 34.9  35.1  35.0   Platelets 150 - 400 K/uL 420  391  428   NEUT# 1.7 -  7.7 K/uL   6.1   Lymph# 0.7 - 4.0 K/uL   3.2       ASSESSMENT AND PLAN:  18 year old female with a history of PTSD, depression, schizophrenia and oppositional defiant disorder who presents for follow-up of  chronic abdominal pain, nausea and vomiting.  She was found to have H. pylori gastritis on upper endoscopy in November and was prescribed quadruple therapy, but has not yet started taking it.  Although the H. pylori infection may be causing some of the patient's symptoms, I doubt that eradication of the H. pylori will completely rectify all of her GI issues.  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  We did discuss the importance of eradicating the H. pylori, not only for hopefully improving her symptoms, but also to reduce the risk of potential complications from H. pylori infection, to include increased risk of ulcer, stomach cancer and iron  deficiency.  Her social worker with her will help her obtain the prescriptions for the H. pylori infection.  We discussed the importance of documenting eradication of the bacteria, by submitting a stool sample 4 weeks after completion of the antibiotics.  We also discussed the role of chronic marijuana use and contributing to symptoms of abdominal pain, nausea and vomiting.  I strongly urged the patient to stop smoking marijuana completely, as this may be the most effective intervention to improve her symptoms.  If the patient's symptoms do not improve with H. pylori eradication, we can consider further therapies used to treat gut brain axis disorders such as a TCA or Wellbutrin, although this would need to be done in conjunction with her behavioral health provider.   Helicobacter pylori infection Persistent gastrointestinal symptoms likely due to H. pylori infection. Discussed the importance of eradication to reduce symptoms and prevent complications such as ulcers and gastric cancer. Explained potential antibiotic resistance and the need for multiple treatments. Informed about quadruple therapy regimen and post-treatment stool test for confirmation. - Administer quadruple therapy for 14 days - Order stool  antigen test for H. 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 Explained the gut-brain axis connection and the need for coordinated care with behavioral health to manage symptoms and avoid medication interactions. - Coordinate with behavioral health provider regarding current medications - Consider starting medications for gut-brain axis disorder if symptoms persist post-H. pylori eradication (TCA vs buproprion)  Marijuana use Chronic marijuana use potentially contributing to gastrointestinal symptoms. Discussed the relationship between marijuana and GI symptoms, including abdominal pain, nausea, and vomiting. Advised cessation to improve symptoms. - Advise cessation of marijuana use  Dietary habits impacting gastrointestinal health Discussed the importance of a diet rich in fruits and vegetables to improve gut flora and overall gut health. Advised avoiding processed foods high in salt, fat, and sugar to reduce GI symptoms. - Advise a diet rich in fruits and vegetables - Avoid processed foods high in salt, fat, and sugar  Jarris Kortz E. Stacia, MD [facility]     Billy Knee, FNP

## 2023-06-14 ENCOUNTER — Other Ambulatory Visit (HOSPITAL_COMMUNITY): Payer: Self-pay

## 2023-06-14 MED ORDER — ACETAMINOPHEN 500 MG PO TABS
1000.0000 mg | ORAL_TABLET | Freq: Four times a day (QID) | ORAL | 0 refills | Status: DC
  Filled 2023-06-14: qty 56, 7d supply, fill #0

## 2023-06-14 MED ORDER — IBUPROFEN 400 MG PO TABS
400.0000 mg | ORAL_TABLET | ORAL | 0 refills | Status: DC
  Filled 2023-06-14: qty 30, 5d supply, fill #0

## 2023-06-14 MED ORDER — DEXAMETHASONE 4 MG PO TABS
4.0000 mg | ORAL_TABLET | Freq: Three times a day (TID) | ORAL | 0 refills | Status: DC | PRN
  Filled 2023-06-14: qty 9, 3d supply, fill #0

## 2023-06-14 MED ORDER — CHLORHEXIDINE GLUCONATE 0.12 % MT SOLN
OROMUCOSAL | 0 refills | Status: DC
  Filled 2023-06-14: qty 473, 7d supply, fill #0

## 2023-06-14 MED ORDER — AMOXICILLIN 500 MG PO CAPS
500.0000 mg | ORAL_CAPSULE | Freq: Three times a day (TID) | ORAL | 0 refills | Status: DC
  Filled 2023-06-14: qty 21, 7d supply, fill #0

## 2023-06-16 ENCOUNTER — Encounter (HOSPITAL_COMMUNITY): Payer: Self-pay | Admitting: Emergency Medicine

## 2023-06-16 ENCOUNTER — Other Ambulatory Visit (HOSPITAL_COMMUNITY): Payer: Self-pay

## 2023-06-16 ENCOUNTER — Other Ambulatory Visit: Payer: Self-pay

## 2023-06-16 ENCOUNTER — Emergency Department (HOSPITAL_COMMUNITY)
Admission: EM | Admit: 2023-06-16 | Discharge: 2023-06-16 | Discharge status: Against Medical Advice | Payer: MEDICAID | Attending: Emergency Medicine | Admitting: Emergency Medicine

## 2023-06-16 DIAGNOSIS — R509 Fever, unspecified: Secondary | ICD-10-CM | POA: Diagnosis not present

## 2023-06-16 DIAGNOSIS — R519 Headache, unspecified: Secondary | ICD-10-CM | POA: Insufficient documentation

## 2023-06-16 DIAGNOSIS — R42 Dizziness and giddiness: Secondary | ICD-10-CM | POA: Insufficient documentation

## 2023-06-16 DIAGNOSIS — R600 Localized edema: Secondary | ICD-10-CM | POA: Insufficient documentation

## 2023-06-16 DIAGNOSIS — Z5321 Procedure and treatment not carried out due to patient leaving prior to being seen by health care provider: Secondary | ICD-10-CM | POA: Insufficient documentation

## 2023-06-16 LAB — CBC WITH DIFFERENTIAL/PLATELET
Abs Immature Granulocytes: 0.02 10*3/uL (ref 0.00–0.07)
Basophils Absolute: 0 10*3/uL (ref 0.0–0.1)
Basophils Relative: 0 %
Eosinophils Absolute: 0 10*3/uL (ref 0.0–0.5)
Eosinophils Relative: 0 %
HCT: 34 % — ABNORMAL LOW (ref 36.0–46.0)
Hemoglobin: 11.6 g/dL — ABNORMAL LOW (ref 12.0–15.0)
Immature Granulocytes: 0 %
Lymphocytes Relative: 30 %
Lymphs Abs: 2.3 10*3/uL (ref 0.7–4.0)
MCH: 29.5 pg (ref 26.0–34.0)
MCHC: 34.1 g/dL (ref 30.0–36.0)
MCV: 86.5 fL (ref 80.0–100.0)
Monocytes Absolute: 0.7 10*3/uL (ref 0.1–1.0)
Monocytes Relative: 9 %
Neutro Abs: 4.7 10*3/uL (ref 1.7–7.7)
Neutrophils Relative %: 61 %
Platelets: 311 10*3/uL (ref 150–400)
RBC: 3.93 MIL/uL (ref 3.87–5.11)
RDW: 12.3 % (ref 11.5–15.5)
WBC: 7.8 10*3/uL (ref 4.0–10.5)
nRBC: 0 % (ref 0.0–0.2)

## 2023-06-16 LAB — COMPREHENSIVE METABOLIC PANEL
ALT: 9 U/L (ref 0–44)
AST: 14 U/L — ABNORMAL LOW (ref 15–41)
Albumin: 3.7 g/dL (ref 3.5–5.0)
Alkaline Phosphatase: 86 U/L (ref 38–126)
Anion gap: 14 (ref 5–15)
BUN: 12 mg/dL (ref 6–20)
CO2: 20 mmol/L — ABNORMAL LOW (ref 22–32)
Calcium: 9.4 mg/dL (ref 8.9–10.3)
Chloride: 105 mmol/L (ref 98–111)
Creatinine, Ser: 0.62 mg/dL (ref 0.44–1.00)
GFR, Estimated: >60 mL/min (ref >60)
Glucose, Bld: 86 mg/dL (ref 70–99)
Potassium: 3.1 mmol/L — ABNORMAL LOW (ref 3.5–5.1)
Sodium: 139 mmol/L (ref 135–145)
Total Bilirubin: 0.6 mg/dL (ref 0.0–1.2)
Total Protein: 7.1 g/dL (ref 6.5–8.1)

## 2023-06-16 LAB — HCG, SERUM, QUALITATIVE: Preg, Serum: NEGATIVE

## 2023-06-16 LAB — I-STAT CG4 LACTIC ACID, ED: Lactic Acid, Venous: 1 mmol/L (ref 0.5–1.9)

## 2023-06-16 MED ORDER — OXYCODONE HCL 5 MG/5ML PO SOLN: 5.0000 mg | Freq: Once | ORAL | Status: DC

## 2023-06-16 NOTE — ED Notes (Signed)
 Pt called x3 for repeat VS in WR. No answer.

## 2023-06-16 NOTE — ED Provider Triage Note (Addendum)
 Emergency Medicine Provider Triage Evaluation Note  Amy Wall , a 18 y.o. female  was evaluated in triage.  Pt complains of facial swelling, difficulty swallowing, difficulty opening her mouth, headache and dizziness.  She is status post wisdom teeth removal 5 days.  Endorses subjective fevers.  Review of Systems  Positive:  Negative:   Physical Exam  BP (!) 128/94   Pulse (!) 101   Temp 99.2 F (37.3 C) (Oral)   Resp 18   Wt 81.6 kg   LMP 06/13/2023 (Approximate)   SpO2 98%   BMI 31.89 kg/m  Gen:   Awake, patient appears uncomfortable due to pain Resp:  Normal effort  MSK:   Moves extremities without difficulty  Other:  Mild bilateral facial swelling, there is no submandibular swelling, no pain with EOM, airways patent, uvula is midline, no evidence of peritonsillar abscess, no pooling of secretions, there is no trismus but there is pain with opening her jaw, neuro without gross deficit  Medical Decision Making  Medically screening exam initiated at 1:23 PM.  Appropriate orders placed.  ZAINA JENKIN was informed that the remainder of the evaluation will be completed by another provider, this initial triage assessment does not replace that evaluation, and the importance of remaining in the ED until their evaluation is complete.     Donnajean Lynwood DEL, PA-C 06/16/23 1327    Donnajean Lynwood DEL, PA-C 06/16/23 1402

## 2023-06-16 NOTE — ED Triage Notes (Addendum)
 Pt had wisdom teeth removed 5 days ago, now experiencing pain with swallowing, sbj fever, trouble eating, emesis, dizziness.   In triage, speaking, not drooling, alert, NAD

## 2023-06-17 ENCOUNTER — Telehealth: Payer: Self-pay

## 2023-06-17 NOTE — Transitions of Care (Post Inpatient/ED Visit) (Signed)
   06/17/2023  Name: Amy Wall MRN: [id number] DOB: 06-27-2005  Today's TOC FU Call Status: Today's TOC FU Call Status:: Successful TOC FU Call Completed TOC FU Call Complete Date: 06/17/23 Patient's Name and Date of Birth confirmed.  Transition Care Management Follow-up Telephone Call Date of Discharge: 06/16/23 Discharge Facility: [facility]) Type of Discharge: Emergency Department How have you been since you were released from the hospital?: Better Any questions or concerns?: Yes  Items Reviewed: Did you receive and understand the discharge instructions provided?: No (Pt left AMA) Medications obtained,verified, and reconciled?: No Any new allergies since your discharge?: No Dietary orders reviewed?: NA Do you have support at home?: Yes  Medications Reviewed Today: Medications Reviewed Today   Medications were not reviewed in this encounter     Home Care and Equipment/Supplies: Were Home Health Services Ordered?: NA Any new equipment or medical supplies ordered?: NA  Functional Questionnaire: Do you need assistance with bathing/showering or dressing?: No Do you need assistance with meal preparation?: No Do you need assistance with eating?: No Do you have difficulty maintaining continence: No Do you need assistance with getting out of bed/getting out of a chair/moving?: No Do you have difficulty managing or taking your medications?: No  Follow up appointments reviewed: PCP Follow-up appointment confirmed?: Yes Date of PCP follow-up appointment?: 06/23/23 Follow-up Provider: [facility] Follow-up appointment confirmed?: NA Do you need transportation to your follow-up appointment?: No Do you understand care options if your condition(s) worsen?: Yes-patient verbalized understanding    SIGNATURE Dedra Sorenson, BSN, RN

## 2023-06-20 ENCOUNTER — Emergency Department (HOSPITAL_COMMUNITY)
Admission: EM | Admit: 2023-06-20 | Discharge: 2023-06-24 | Disposition: A | Discharge status: Other Acute Inpatient Hospital | Payer: MEDICAID | Attending: Emergency Medicine | Admitting: Emergency Medicine

## 2023-06-20 DIAGNOSIS — S60812A Abrasion of left wrist, initial encounter: Secondary | ICD-10-CM | POA: Insufficient documentation

## 2023-06-20 DIAGNOSIS — Z7289 Other problems related to lifestyle: Secondary | ICD-10-CM

## 2023-06-20 DIAGNOSIS — S0993XA Unspecified injury of face, initial encounter: Secondary | ICD-10-CM | POA: Diagnosis present

## 2023-06-20 DIAGNOSIS — S0081XA Abrasion of other part of head, initial encounter: Secondary | ICD-10-CM | POA: Insufficient documentation

## 2023-06-20 DIAGNOSIS — R109 Unspecified abdominal pain: Secondary | ICD-10-CM | POA: Diagnosis not present

## 2023-06-20 DIAGNOSIS — Z79899 Other long term (current) drug therapy: Secondary | ICD-10-CM | POA: Insufficient documentation

## 2023-06-20 DIAGNOSIS — T1491XA Suicide attempt, initial encounter: Secondary | ICD-10-CM | POA: Diagnosis present

## 2023-06-20 DIAGNOSIS — X780XXA Intentional self-harm by sharp glass, initial encounter: Secondary | ICD-10-CM | POA: Diagnosis not present

## 2023-06-20 DIAGNOSIS — R000 Tachycardia, unspecified: Secondary | ICD-10-CM | POA: Diagnosis not present

## 2023-06-20 DIAGNOSIS — Z91010 Allergy to peanuts: Secondary | ICD-10-CM | POA: Insufficient documentation

## 2023-06-20 DIAGNOSIS — F332 Major depressive disorder, recurrent severe without psychotic features: Secondary | ICD-10-CM | POA: Insufficient documentation

## 2023-06-20 DIAGNOSIS — R45851 Suicidal ideations: Secondary | ICD-10-CM

## 2023-06-21 ENCOUNTER — Other Ambulatory Visit (HOSPITAL_COMMUNITY): Payer: Self-pay

## 2023-06-21 ENCOUNTER — Other Ambulatory Visit: Payer: Self-pay

## 2023-06-21 ENCOUNTER — Encounter (HOSPITAL_COMMUNITY): Payer: Self-pay | Admitting: *Deleted

## 2023-06-21 DIAGNOSIS — Z7289 Other problems related to lifestyle: Secondary | ICD-10-CM | POA: Diagnosis not present

## 2023-06-21 LAB — CBC WITH DIFFERENTIAL/PLATELET
Abs Immature Granulocytes: 0.01 10*3/uL (ref 0.00–0.07)
Basophils Absolute: 0 10*3/uL (ref 0.0–0.1)
Basophils Relative: 0 %
Eosinophils Absolute: 0 10*3/uL (ref 0.0–0.5)
Eosinophils Relative: 1 %
HCT: 33.4 % — ABNORMAL LOW (ref 36.0–46.0)
Hemoglobin: 10.8 g/dL — ABNORMAL LOW (ref 12.0–15.0)
Immature Granulocytes: 0 %
Lymphocytes Relative: 39 %
Lymphs Abs: 3.2 10*3/uL (ref 0.7–4.0)
MCH: 28.4 pg (ref 26.0–34.0)
MCHC: 32.3 g/dL (ref 30.0–36.0)
MCV: 87.9 fL (ref 80.0–100.0)
Monocytes Absolute: 0.7 10*3/uL (ref 0.1–1.0)
Monocytes Relative: 9 %
Neutro Abs: 4.2 10*3/uL (ref 1.7–7.7)
Neutrophils Relative %: 51 %
Platelets: 355 10*3/uL (ref 150–400)
RBC: 3.8 MIL/uL — ABNORMAL LOW (ref 3.87–5.11)
RDW: 12.2 % (ref 11.5–15.5)
WBC: 8.3 10*3/uL (ref 4.0–10.5)
nRBC: 0 % (ref 0.0–0.2)

## 2023-06-21 LAB — HCG, SERUM, QUALITATIVE: Preg, Serum: NEGATIVE

## 2023-06-21 LAB — COMPREHENSIVE METABOLIC PANEL
ALT: 11 U/L (ref 0–44)
AST: 12 U/L — ABNORMAL LOW (ref 15–41)
Albumin: 3.6 g/dL (ref 3.5–5.0)
Alkaline Phosphatase: 86 U/L (ref 38–126)
Anion gap: 10 (ref 5–15)
BUN: 11 mg/dL (ref 6–20)
CO2: 25 mmol/L (ref 22–32)
Calcium: 9.3 mg/dL (ref 8.9–10.3)
Chloride: 105 mmol/L (ref 98–111)
Creatinine, Ser: 0.5 mg/dL (ref 0.44–1.00)
GFR, Estimated: >60 mL/min (ref >60)
Glucose, Bld: 96 mg/dL (ref 70–99)
Potassium: 3.3 mmol/L — ABNORMAL LOW (ref 3.5–5.1)
Sodium: 140 mmol/L (ref 135–145)
Total Bilirubin: 0.3 mg/dL (ref 0.0–1.2)
Total Protein: 7.3 g/dL (ref 6.5–8.1)

## 2023-06-21 LAB — RAPID URINE DRUG SCREEN, HOSP PERFORMED
Amphetamines: NOT DETECTED
Barbiturates: NOT DETECTED
Benzodiazepines: NOT DETECTED
Cocaine: NOT DETECTED
Opiates: NOT DETECTED
Tetrahydrocannabinol: POSITIVE — AB

## 2023-06-21 LAB — ETHANOL: Alcohol, Ethyl (B): <10 mg/dL (ref <10)

## 2023-06-21 MED ORDER — ENSURE ENLIVE PO LIQD
1.0000 | Freq: Two times a day (BID) | ORAL | Status: AC
  Administered 2023-06-21: 237 mL via ORAL
  Filled 2023-06-21 (×2): qty 237

## 2023-06-21 MED ORDER — AMOXICILLIN 500 MG PO CAPS
500.0000 mg | ORAL_CAPSULE | Freq: Three times a day (TID) | ORAL | Status: DC
  Administered 2023-06-21 – 2023-06-22 (×3): 500 mg via ORAL
  Filled 2023-06-21 (×5): qty 1

## 2023-06-21 MED ORDER — ENSURE ENLIVE PO LIQD
1.0000 | Freq: Two times a day (BID) | ORAL | Status: AC
  Administered 2023-06-21: 237 mL via ORAL
  Filled 2023-06-21: qty 237

## 2023-06-21 MED ORDER — IBUPROFEN 200 MG PO TABS
400.0000 mg | ORAL_TABLET | ORAL | Status: DC | PRN
  Administered 2023-06-21 – 2023-06-24 (×4): 400 mg via ORAL
  Filled 2023-06-21 (×4): qty 2

## 2023-06-21 MED ORDER — ENSURE ENLIVE PO LIQD: 237.0000 mL | Freq: Two times a day (BID) | ORAL | Status: DC

## 2023-06-21 MED ORDER — ZIPRASIDONE MESYLATE 20 MG IM SOLR
20.0000 mg | Freq: Once | INTRAMUSCULAR | Status: AC
  Administered 2023-06-21: 20 mg via INTRAMUSCULAR
  Filled 2023-06-21: qty 20

## 2023-06-21 MED ORDER — STERILE WATER FOR INJECTION IJ SOLN
INTRAMUSCULAR | Status: AC
  Filled 2023-06-21: qty 10

## 2023-06-21 MED ORDER — ONDANSETRON 4 MG PO TBDP
4.0000 mg | ORAL_TABLET | Freq: Once | ORAL | Status: AC
  Administered 2023-06-21: 4 mg via ORAL
  Filled 2023-06-21: qty 1

## 2023-06-21 MED ORDER — BACITRACIN ZINC 500 UNIT/GM EX OINT
TOPICAL_OINTMENT | Freq: Two times a day (BID) | CUTANEOUS | Status: DC
  Administered 2023-06-21 (×2): 1 via TOPICAL
  Administered 2023-06-21 – 2023-06-22 (×2): 2 via TOPICAL
  Administered 2023-06-22 – 2023-06-24 (×4): 1 via TOPICAL
  Filled 2023-06-21: qty 2.7
  Filled 2023-06-21: qty 0.9
  Filled 2023-06-21: qty 1.8
  Filled 2023-06-21 (×5): qty 0.9

## 2023-06-21 NOTE — ED Provider Notes (Signed)
  Physical Exam  BP 133/81 (BP Location: Left Arm)   Pulse 93   Temp 98.8 F (37.1 C) (Oral)   Resp 19   LMP 06/13/2023 (Approximate)   SpO2 98%   Physical Exam Vitals and nursing note reviewed.  HENT:     Head: Normocephalic and atraumatic.  Eyes:     Pupils: Pupils are equal, round, and reactive to light.  Cardiovascular:     Rate and Rhythm: Normal rate and regular rhythm.  Pulmonary:     Effort: Pulmonary effort is normal.     Breath sounds: Normal breath sounds.  Abdominal:     Palpations: Abdomen is soft.     Tenderness: There is no abdominal tenderness.  Skin:    General: Skin is warm and dry.  Neurological:     Mental Status: She is alert.  Psychiatric:        Mood and Affect: Mood normal.     Procedures  Procedures  ED Course / MDM   Clinical Course as of 06/21/23 1103  Tue Jun 21, 2023  1102 Laboratory workup completed.  Pregnancy test negative.  No other significant abnormalities on labs.  Medically cleared for psychiatric evaluation.  Patient has been moved back to room 35 [MP]    Clinical Course User Index [MP] Royanne Foots, DO   Medical Decision Making I, Estelle June DO, have assumed care of this patient from the previous provider pending remainder of laboratory workup for medical clearance prior to psychiatric evaluation  Amount and/or Complexity of Data Reviewed Labs: ordered.  Risk OTC drugs. Prescription drug management.          Royanne Foots, DO 06/21/23 1103

## 2023-06-21 NOTE — ED Triage Notes (Signed)
Arrived with EMS after breaking a window and used the glass to cut her Lwrist and L cheek for self-harm, bleeding controlled.  VS 136/0, pulse 100, 98%RA

## 2023-06-21 NOTE — ED Notes (Signed)
Pt currently denies any SI/HI, A/V Hallucinations.

## 2023-06-21 NOTE — ED Notes (Signed)
Pt. Has significant other [facility] visiting

## 2023-06-21 NOTE — ED Notes (Signed)
Pt belongings, collected currently are x1 cellphone, charging cable, pocketbook, placed in x1 belonging bag and placed in 23-25 belonging cabinet, TOP LEFT.

## 2023-06-21 NOTE — ED Notes (Signed)
Breakfast sandwich given to pt

## 2023-06-21 NOTE — Telephone Encounter (Signed)
Called [name], patient's legal guardian, to inform her that PCP is ok with doing physical, depo, and assess asthma all in one visit. No VM set up, unable to LVM.

## 2023-06-21 NOTE — Telephone Encounter (Signed)
Yes that's fine

## 2023-06-21 NOTE — ED Notes (Signed)
Cleaned wound on patient's face and applied Bacitracin
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
## 2023-06-21 NOTE — ED Notes (Signed)
TTS in progress

## 2023-06-21 NOTE — ED Notes (Signed)
Pt given lunch tray.
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
## 2023-06-21 NOTE — Consult Note (Signed)
[facility] Psychiatric Consult Initial  Patient Name: .MERRY Wall  MRN: [id number]  DOB: 13-Apr-2006  Consult Order details:  Orders (From admission, onward)     Start     Ordered   06/21/23 0055  CONSULT TO CALL ACT TEAM       Ordering Provider: Tilden Fossa, MD  Provider:  (Not yet assigned)  Question:  Reason for Consult?  Answer:  self harm   06/21/23 0054             Mode of Visit: In person    Psychiatry Consult Evaluation  Service Date: June 21, 2023 LOS:  LOS: 0 days  Chief Complaint :  Intentional Self inflicted cut  Primary Psychiatric Diagnoses  Self Inflicted cut 2.  Suicide behavior with attempted injury  Assessment  Amy Wall is a 19 y.o. female admitted: Presented to the EDfor 06/20/2023 11:58 PM for self inflicted cut to left side of face and left wrist as documented in triage note. She carries the psychiatric diagnoses of MDD,  PTSD, OD, Cannabis use disorder and has a past medical history of  GERD, Normocytic Anemia  Her current presentation of anger and agitation is most consistent with diagnosis of explosive anger and oppositional defiant disorder. She meets criteria for inpatient psychiatry hospitalization based on her self inflicted cut to face and wrist..  Current outpatient psychotropic medications include unknown at this time and historically she has had an unknown response to these medications. She is  compliant with medications prior to admission as evidenced by her report. On initial examination, patient was calm but want to leave the ER as soon as she can minimizing the incident that brought her to the ER.[name] Please see plan below for detailed recommendations.   Diagnoses:  Active Hospital problems: Principal Problem:   Deliberate self-cutting    Plan   ## Psychiatric Medication Recommendations:  None until current list of Medications are received from [facility] in am.  Legal Guardian believe Medication list in Epic is not  current.  ## Medical Decision Making Capacity: Patient has a guardian and has thus been adjudicated incompetent; please involve patients guardian in medical decision making  ## Further Work-up: na -- most recent EKG -none -- Pertinent labwork reviewed earlier this admission includes: CMP, CBC, UDS, Preg test   ## Disposition:-- We recommend inpatient psychiatric hospitalization after medical hospitalization. Patient has been involuntarily committed on 06/21/2023.   ## Behavioral / Environmental: -To minimize splitting of staff, assign one staff person to communicate all information from the team when feasible. or Utilize compassion and acknowledge the patient's experiences while setting clear and realistic expectations for care.    ## Safety and Observation Level:  - Based on my clinical evaluation, I estimate the patient to be at Moderate risk of self harm in the current setting. - At this time, we recommend  routine. This decision is based on my review of the chart including patient's history and current presentation, interview of the patient, mental status examination, and consideration of suicide risk including evaluating suicidal ideation, plan, intent, suicidal or self-harm behaviors, risk factors, and protective factors. This judgment is based on our ability to directly address suicide risk, implement suicide prevention strategies, and develop a safety plan while the patient is in the clinical setting. Please contact our team if there is a concern that risk level has changed.  CSSR Risk Category:C-SSRS RISK CATEGORY: No Risk  Suicide Risk Assessment: Patient has following modifiable risk factors for suicide:  untreated depression, under treated depression , and recklessness, which we are addressing by recommending inpatient Psychiatry hospitalization. Patient has following non-modifiable or demographic risk factors for suicide: history of suicide attempt, history of self harm behavior,  and psychiatric hospitalization Patient has the following protective factors against suicide: Supportive friends  Thank you for this consult request. Recommendations have been communicated to the primary team.  We will continue to seek inpatient Psychiatry hospitalization. at this time.   Amy Navy, NP-PMHNP-BC       History of Present Illness  Relevant Aspects of Hospital ED Course:  Admitted on 06/20/2023 for Self inflicted cut. .   AA female, 18 years old was brought in by EMS cutting left side of face and left wrist. Triage note states "Arrived with EMS after breaking a window and used the glass to cut her Lwrist and L cheek for self-harm, bleeding controlled"  Per ER Physician note patient reported that she was upset and did not want to hurt somebody else she broke a glass and use it to cut self.  She did this not to kill herself. This evening, patient informed this provider that she used her finger nails to cut left side of her face and that the injury on her left wrist was from last week and not this week.  Patient tried to minimize the event that occurred at home leading to EMS bringing her here.  Patient reports that she was sad and emotional regarding a court case tomorrow.  She called her aunt to vent but instead of aunt being sympathetic she brushed her off and never showed any sympathy or concern  Patient admitted she became very angry and cut her face with her finger.  Patient admitted that she has severe anger issues and that she is in counseling with [facility] once a week. Patient in the ER attempted to elope twice and was brought back.  Patient then threatened staff stating that she will be going to court tomorrow for attacking staff member and will do it a second time.  At a point she was put on restraint early today. Patient had tooth extraction on the 4th.and is supposed to be on antibiotics which she admitted she is not taking as prescribed.  Her ACT team is  [facility] and office is  closed and no means to get current Medication list.  Her legal Guardian is [name] and she informed provider that Medication list in EPIC IS NOT CURRENT. Patient is recommended for inpatient Psychiatry hospitalization for safety and stabilization. We will fax out records for bed placement.  We have resumed her Amoxicillin for post tooth extraction and Ibuprofen for pain.  Psych ROS:  Depression: yes Anxiety:  yes Mania (lifetime and current): no Psychosis: (lifetime and current): no  Collateral information:  Contacted [name] at 1730 pm on 06/21/23 for update and collateral information.  MS Janee Morn is worried about patient missing court date and another oral surgery and is asking for patient to be discharged.  Provider educated Legal Guardian that patient's safety and safety of the community comes first.  ROS   Psychiatric and Social History  Psychiatric History:  Information collected from patient  Prev Dx/Sx:  MDD With psychosis, DMDD (disruptive mood dysregulation disorder), cannabis use disorder, oppositional defiant disorder, PTSD, anxiety and SI.  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-Court hearing tomorrow for assault of a healthcare provider Living Situation: apartment alone Spiritual Hx: denies Access to weapons/lethal means: denies   Substance History Alcohol: denies  Tobacco: denies Illicit drugs: cannabis Prescription drug abuse: opiates in the past Rehab hx: denies  Exam Findings  Physical Exam: Vital Signs:  Temp:  [98.8 F (37.1 C)-98.9 F (37.2 C)] 98.8 F (37.1 C) (02/11 1509) Pulse Rate:   [77-109] 77 (02/11 1509) Resp:  [16-19] 16 (02/11 1509) BP: (123-133)/(75-94) 123/75 (02/11 1509) SpO2:  [98 %-100 %] 100 % (02/11 1509) Blood pressure 123/75, pulse 77, temperature 98.8 F (37.1 C), temperature source Oral, resp. rate 16, last menstrual period 06/13/2023, SpO2 100%. There is no height or weight on file to calculate BMI.  Physical Exam  Mental Status Exam: General Appearance: Casual  Orientation:  Full (Time, Place, and Person)  Memory:  Immediate;   Good Recent;   Good Remote;   Good  Concentration:  Concentration: Good and Attention Span: Good  Recall:  Fair  Attention  Fair  Eye Contact:  Good  Speech:  Clear and Coherent  Language:  Good  Volume:  Normal  Mood: "sad, angry"  Affect:  Congruent  Thought Process:  Coherent  Thought Content:  Logical  Suicidal Thoughts:  No  Homicidal Thoughts:  No  Judgement:  Poor  Insight:  Fair  Psychomotor Activity:  Normal  Akathisia:  NA  Fund of Knowledge:  Fair      Assets:  Games developer Social Support  Cognition:  WNL  ADL's:  Intact  AIMS (if indicated):        Other History   These have been pulled in through the EMR, reviewed, and updated if appropriate.  Family History:  The patient's family history includes Allergic rhinitis in her father and sister; Eczema in her sister.  Medical History: Past Medical History:  Diagnosis Date  . Anxiety   . Asthma   . DMDD (disruptive mood dysregulation disorder) (HCC)   . Epilepsy (HCC)    pseuodoseizures per chart  . HTN (hypertension)   . Major depressive disorder   . Oppositional defiant disorder   . PTSD (post-traumatic stress disorder)   . Schizophrenia (HCC)   . Seizures [facility])     Surgical History: Past Surgical History:  Procedure Laterality Date  . BACK SURGERY    . CHOLECYSTECTOMY, LAPAROSCOPIC       Medications:   Current Facility-Administered Medications:  .  bacitracin ointment, , Topical, BID,  Tilden Fossa, MD, 1 Application at 06/21/23 1600 .  ibuprofen (ADVIL) tablet 400 mg, 400 mg, Oral, Q4H PRN, Welford Roche, Anuhea Gassner C, NP  Current Outpatient Medications:  .  acetaminophen (TYLENOL) 500 MG tablet, Take 2 tablets (1,000 mg total) by mouth every 6 (six) hours for 7 days, Disp: 56 tablet, Rfl: 0 .  albuterol (VENTOLIN HFA) 108 (90 Base) MCG/ACT inhaler, Inhale 2 puffs into the lungs every 6 (six) hours as needed for wheezing or shortness of breath., Disp: 8 g, Rfl: 2 .  amoxicillin (AMOXIL) 500 MG capsule, Take 1 capsule (500 mg total) by mouth 3 (three) times daily for 7 days, Disp: 21 capsule, Rfl: 0 .  Bismuth 262 MG CHEW, Chew 1 tablet by mouth in the morning, at noon, in the evening, and at bedtime., Disp: 112 tablet, Rfl: 0 .  budesonide-formoterol (SYMBICORT) 80-4.5 MCG/ACT inhaler, Inhale 2 puffs into the lungs 2 (two) times daily., Disp: 1 each, Rfl: 12 .  chlorhexidine (PERIDEX) 0.12 % solution, Swish for 30 seconds then spit, use twice daily, Disp: 473 mL, Rfl: 0 .  dexamethasone (DECADRON) 4 MG tablet, Take 1 tablet (4 mg total) by mouth 3 (three) times daily as needed for swelling., Disp: 9 tablet, Rfl: 0 .  doxycycline (VIBRAMYCIN) 100 MG capsule, Take 1 capsule (100 mg total) by mouth 2 (two) times daily., Disp: 28 capsule, Rfl: 0 .  EPINEPHrine 0.3 mg/0.3 mL IJ SOAJ injection, Inject into  mid outer thigh 1 time for 1 dose., Disp: 2 each, Rfl: 0 .  hydrOXYzine (ATARAX) 25 MG tablet, Take 1 tablet (25 mg total) by mouth 3 (three) times daily as needed for anxiety., Disp: 30 tablet, Rfl: 0 .  ibuprofen (ADVIL) 400 MG tablet, Take 1 tablet (400 mg total) by mouth every 4 (four) hours  for pain, Disp: 30 tablet, Rfl: 0 .  ibuprofen (ADVIL) 600 MG tablet, Take 1 tablet (600 mg total) by mouth a night time as needed., Disp: 16 tablet, Rfl: 1 .  medroxyPROGESTERone (DEPO-PROVERA) 150 MG/ML injection, Inject 1 mL (150 mg total) into the muscle every 3 (three) months., Disp: 1 mL,  Rfl: 4 .  metroNIDAZOLE (FLAGYL) 250 MG tablet, Take 1 tablet (250 mg total) by mouth 3 (three) times daily., Disp: 42 tablet, Rfl: 0 .  metroNIDAZOLE (FLAGYL) 500 MG tablet, Take 1 tablet (500 mg total) by mouth 2 (two) times daily., Disp: 14 tablet, Rfl: 0 .  ondansetron (ZOFRAN-ODT) 4 MG disintegrating tablet, Take 1 tablet (4 mg total) by mouth every 8 (eight) hours as needed for nausea or vomiting., Disp: 12 tablet, Rfl: 0 .  pantoprazole (PROTONIX) 40 MG tablet, Take 1 tablet (40 mg total) by mouth 2 (two) times daily before a meal., Disp: 28 tablet, Rfl: 0 .  potassium chloride (KLOR-CON) 10 MEQ tablet, Take 1 tablet (10 mEq total) by mouth daily., Disp: 4 tablet, Rfl: 0 .  promethazine (PHENERGAN) 25 MG suppository, Place 1 suppository (25 mg total) rectally every 6 (six) hours as needed for nausea or vomiting., Disp: 12 each, Rfl: 0 .  promethazine (PHENERGAN) 25 MG tablet, Take 0.5 tablets (12.5 mg total) by mouth every 6 (six) hours as needed for nausea or vomiting., Disp: 20 tablet, Rfl: 0 .  sertraline (ZOLOFT) 100 MG tablet, Take 1 tablet (100 mg total) by mouth daily., Disp: 30 tablet, Rfl: 0 .  topiramate (TOPAMAX) 25 MG tablet, Take 1 tablet (25 mg total) by mouth 2 (two) times daily., Disp: 60 tablet, Rfl: 0 .  traZODone (DESYREL) 100 MG tablet, Take 1 tablet (100 mg total) by mouth every night., Disp: 30 tablet, Rfl: 0  Allergies: Allergies  Allergen Reactions  . Peanut-Containing Drug Products Anaphylaxis, Hives and Itching    Other Reaction(s): other  . Pineapple Anaphylaxis and Hives  . Pineapple Extract     Other Reaction(s): other  . Shellfish Allergy Anaphylaxis and Hives  . Shellfish-Derived Products     Other Reaction(s): other  . Strawberry Extract Anaphylaxis and Hives    Other Reaction(s): other  . Charentais Melon (French Melon) Swelling    Other Reaction(s): Other (see comments)  Numbness and swelling of throat and tongue.  [name] Chocolate Itching    Throat  close    Amy Navy, NP

## 2023-06-21 NOTE — BH Assessment (Signed)
Social Worker Disposition Note:   Dahlia Byes, NP, continues to recommend inpatient psychiatric treatment.@ 1946, during the PM shift, hospital referrals were re-faxed to facilities for consideration of bed placement. See hospitals listed below:  Destination  Service Provider Address Phone Fax  [facility] [address] [place] [address] [phone]  [facility] [address] [place] [address] [phone]  [facility] [address] [place] [address] [phone]  [facility] [address] [place], [place] [address] [phone]  [facility] [address] [place] [address] [phone]  [facility] [address], [place] [address] [phone]  [facility] [address] [place] [address] [phone]  [facility] [address] [place] [address] [phone]  [facility] [place] [phone]  [facility] EFAX [address] [place], [place] [phone]  [facility] [address], [place] [address] [phone]  [facility] [address] [place], [place] [address] [phone]  [facility] [address],

## 2023-06-21 NOTE — ED Notes (Addendum)
Legal Guardian Amy Wall,Amy Wall [phone] (Mobile) would like to be contacted once psychiatrist speaks with patient for updates on plan of care.

## 2023-06-21 NOTE — Progress Notes (Signed)
LCSW Progress Note  [id number]   Amy Wall  06/21/2023  8:12 AM  Description:   Inpatient Psychiatric Referral  Patient was recommended inpatient per Rockney Ghee, NP.   There are no available beds at [facility], per [facility] Rosey Bath RN. Patient was referred to the following out of network facilities:    [facility] Provider Address Phone Fax  [facility] [address], [place] [address] [phone]  [facility] [address] [place] [address] [phone]  [facility] [address] [place] [address] [phone]  [facility] [address] [place], [place] [address] [phone]  [facility] [address] [place] [address] [phone]  [facility] [address] [place] [address] [phone]  [facility] [address] [place] [address] [phone]  [facility] [address] [place] [address] [phone]  [facility] [place] [phone]  [facility] EFAX [address] [place], [place] [phone]  [facility] [address], [place] [address] [phone]  [facility] [address] [place] [address] [phone]  [facility] [address], [place] [address] [phone]      Situation ongoing, CSW to continue following and update chart as more information becomes available.     [place] Tamea Bai, MSW, LCSW  06/21/2023 8:12 AM

## 2023-06-21 NOTE — ED Notes (Signed)
Pt. Given dinner tray.

## 2023-06-21 NOTE — ED Notes (Addendum)
EDP informed pt that psych team has recommended inpatient. Following this, pt attempted to elope from the department. EDP issuing IVC on pt. Pt was escorted back to hall B by security and on-site police. Pt attempted to elope again. Pt was placed in violent restraints. Pt was threatening staff stating "I'm in here for an assault charge on you all, I'm gonna catch another one!" EDP at bedside.

## 2023-06-21 NOTE — ED Notes (Signed)
Restraints were removed. Pt very cooperative allowing staff to obtain blood for labs. Patient was then led to the restroom where she changed out into purple scrubs

## 2023-06-22 ENCOUNTER — Emergency Department (HOSPITAL_COMMUNITY): Payer: MEDICAID

## 2023-06-22 LAB — COMPREHENSIVE METABOLIC PANEL
ALT: 12 U/L (ref 0–44)
AST: 19 U/L (ref 15–41)
Albumin: 4.1 g/dL (ref 3.5–5.0)
Alkaline Phosphatase: 89 U/L (ref 38–126)
Anion gap: 11 (ref 5–15)
BUN: 12 mg/dL (ref 6–20)
CO2: 21 mmol/L — ABNORMAL LOW (ref 22–32)
Calcium: 9.3 mg/dL (ref 8.9–10.3)
Chloride: 102 mmol/L (ref 98–111)
Creatinine, Ser: 0.55 mg/dL (ref 0.44–1.00)
GFR, Estimated: >60 mL/min (ref >60)
Glucose, Bld: 101 mg/dL — ABNORMAL HIGH (ref 70–99)
Potassium: 3.3 mmol/L — ABNORMAL LOW (ref 3.5–5.1)
Sodium: 134 mmol/L — ABNORMAL LOW (ref 135–145)
Total Bilirubin: 0.5 mg/dL (ref 0.0–1.2)
Total Protein: 7.9 g/dL (ref 6.5–8.1)

## 2023-06-22 LAB — CBC
HCT: 34.1 % — ABNORMAL LOW (ref 36.0–46.0)
Hemoglobin: 11.7 g/dL — ABNORMAL LOW (ref 12.0–15.0)
MCH: 30 pg (ref 26.0–34.0)
MCHC: 34.3 g/dL (ref 30.0–36.0)
MCV: 87.4 fL (ref 80.0–100.0)
Platelets: 364 10*3/uL (ref 150–400)
RBC: 3.9 MIL/uL (ref 3.87–5.11)
RDW: 12 % (ref 11.5–15.5)
WBC: 7.4 10*3/uL (ref 4.0–10.5)
nRBC: 0 % (ref 0.0–0.2)

## 2023-06-22 LAB — LIPASE, BLOOD: Lipase: 29 U/L (ref 11–51)

## 2023-06-22 LAB — CBG MONITORING, ED: Glucose-Capillary: 89 mg/dL (ref 70–99)

## 2023-06-22 MED ORDER — ONDANSETRON 4 MG PO TBDP
4.0000 mg | ORAL_TABLET | Freq: Once | ORAL | Status: AC
  Administered 2023-06-22: 4 mg via ORAL
  Filled 2023-06-22: qty 1

## 2023-06-22 MED ORDER — TRAZODONE HCL 100 MG PO TABS
100.0000 mg | ORAL_TABLET | Freq: Every day | ORAL | Status: DC
  Administered 2023-06-22 – 2023-06-23 (×2): 100 mg via ORAL
  Filled 2023-06-22 (×2): qty 1

## 2023-06-22 MED ORDER — SERTRALINE HCL 50 MG PO TABS
100.0000 mg | ORAL_TABLET | Freq: Every day | ORAL | Status: DC
  Administered 2023-06-22 – 2023-06-24 (×3): 100 mg via ORAL
  Filled 2023-06-22 (×3): qty 2

## 2023-06-22 MED ORDER — CHLORHEXIDINE GLUCONATE 0.12 % MT SOLN
15.0000 mL | Freq: Two times a day (BID) | OROMUCOSAL | Status: DC
  Administered 2023-06-22 – 2023-06-24 (×4): 15 mL via OROMUCOSAL
  Filled 2023-06-22 (×7): qty 15

## 2023-06-22 MED ORDER — PANTOPRAZOLE SODIUM 40 MG PO TBEC
40.0000 mg | DELAYED_RELEASE_TABLET | Freq: Two times a day (BID) | ORAL | Status: DC
  Administered 2023-06-22 – 2023-06-24 (×5): 40 mg via ORAL
  Filled 2023-06-22 (×5): qty 1

## 2023-06-22 MED ORDER — ALBUTEROL SULFATE HFA 108 (90 BASE) MCG/ACT IN AERS
1.0000 | INHALATION_SPRAY | Freq: Once | RESPIRATORY_TRACT | Status: AC
  Administered 2023-06-22: 1 via RESPIRATORY_TRACT
  Filled 2023-06-22: qty 6.7

## 2023-06-22 MED ORDER — ONDANSETRON 4 MG PO TBDP: 4.0000 mg | ORAL_TABLET | Freq: Once | ORAL | Status: DC

## 2023-06-22 MED ORDER — DIPHENHYDRAMINE HCL 50 MG/ML IJ SOLN
12.5000 mg | Freq: Once | INTRAMUSCULAR | Status: AC
  Administered 2023-06-22: 12.5 mg via INTRAVENOUS
  Filled 2023-06-22: qty 1

## 2023-06-22 MED ORDER — TOPIRAMATE 25 MG PO TABS
25.0000 mg | ORAL_TABLET | Freq: Two times a day (BID) | ORAL | Status: DC
  Administered 2023-06-22 – 2023-06-24 (×5): 25 mg via ORAL
  Filled 2023-06-22 (×5): qty 1

## 2023-06-22 MED ORDER — ACETAMINOPHEN 325 MG PO TABS
650.0000 mg | ORAL_TABLET | Freq: Once | ORAL | Status: AC
  Administered 2023-06-22: 650 mg via ORAL
  Filled 2023-06-22: qty 2

## 2023-06-22 MED ORDER — ALUM & MAG HYDROXIDE-SIMETH 200-200-20 MG/5ML PO SUSP
30.0000 mL | Freq: Once | ORAL | Status: DC
  Filled 2023-06-22: qty 30

## 2023-06-22 MED ORDER — MOMETASONE FURO-FORMOTEROL FUM 100-5 MCG/ACT IN AERO
2.0000 | INHALATION_SPRAY | Freq: Two times a day (BID) | RESPIRATORY_TRACT | Status: DC
  Administered 2023-06-22: 2 via RESPIRATORY_TRACT
  Filled 2023-06-22 (×2): qty 8.8

## 2023-06-22 MED ORDER — AMOXICILLIN 500 MG PO CAPS
500.0000 mg | ORAL_CAPSULE | Freq: Three times a day (TID) | ORAL | Status: DC
  Administered 2023-06-22 – 2023-06-24 (×5): 500 mg via ORAL
  Filled 2023-06-22 (×9): qty 1

## 2023-06-22 MED ORDER — SODIUM CHLORIDE 0.9 % IV BOLUS
1000.0000 mL | Freq: Once | INTRAVENOUS | Status: AC
  Administered 2023-06-22: 1000 mL via INTRAVENOUS

## 2023-06-22 MED ORDER — LORAZEPAM 2 MG/ML IJ SOLN
1.0000 mg | Freq: Once | INTRAMUSCULAR | Status: AC
  Administered 2023-06-22: 1 mg via INTRAMUSCULAR
  Filled 2023-06-22: qty 1

## 2023-06-22 MED ORDER — IOHEXOL 300 MG/ML  SOLN
100.0000 mL | Freq: Once | INTRAMUSCULAR | Status: AC | PRN
  Administered 2023-06-22: 100 mL via INTRAVENOUS

## 2023-06-22 MED ORDER — PROCHLORPERAZINE EDISYLATE 10 MG/2ML IJ SOLN
10.0000 mg | Freq: Once | INTRAMUSCULAR | Status: AC
  Administered 2023-06-22: 10 mg via INTRAVENOUS
  Filled 2023-06-22: qty 2

## 2023-06-22 MED ORDER — ENSURE ENLIVE PO LIQD
237.0000 mL | Freq: Two times a day (BID) | ORAL | Status: DC
  Administered 2023-06-22 – 2023-06-23 (×4): 237 mL via ORAL
  Filled 2023-06-22 (×6): qty 237

## 2023-06-22 MED ORDER — HYDROXYZINE HCL 25 MG PO TABS: 25.0000 mg | ORAL_TABLET | Freq: Three times a day (TID) | ORAL | Status: DC | PRN

## 2023-06-22 MED ORDER — CHLORHEXIDINE GLUCONATE 0.12 % MT SOLN
10.0000 mL | Freq: Two times a day (BID) | OROMUCOSAL | Status: DC
  Filled 2023-06-22: qty 15
  Filled 2023-06-22: qty 10

## 2023-06-22 NOTE — ED Notes (Signed)
Pt said she is in pain now. Pt said that her stomach is burning and threw up a little bit.

## 2023-06-22 NOTE — ED Notes (Signed)
CT team is in the room to take xray.

## 2023-06-22 NOTE — BH Assessment (Signed)
Disposition Note:   Inpatient Psychiatric Referral: The patient was recommended for inpatient psychiatric care per Dahlia Byes, NP on 06/21/2023. There [facility] beds at [facility], per [facility], Cheron Every, RN. The patient was re-referred and refaxed to the following out-of-network facilities during the PM shift on 06/22/2023 at 7:31 PM.

## 2023-06-22 NOTE — ED Notes (Signed)
Pt has a female visitor.

## 2023-06-22 NOTE — Progress Notes (Signed)
06/22/2023 Patient crying in pain. Pt c/o abd pain with a burning sensation. Patient said after she vomited she felt something pop in her stomach. Notified MD. MD came to see patient.

## 2023-06-22 NOTE — ED Notes (Signed)
Pt given sandwich to eat before taking medicine

## 2023-06-22 NOTE — Progress Notes (Signed)
LCSW Progress Note  [id number]   Amy Wall  06/22/2023  12:54 PM  Description:   Inpatient Psychiatric Referral  Patient was recommended inpatient per Dahlia Byes NP. There [facility] at [facility], per [facility] Malva Limes RN Patient was referred to the following out of network facilities:   [facility] Provider Address Phone Fax  [facility] [address], [place] [address] [phone]  [facility] [address] [place] [address] [phone]  [facility] [address] [place] [address] [phone]  [facility] [address] [place], [place] [address] [phone]  [facility] [address] [place] [address] [phone]  [facility] [address], [place] [address] [phone]  [facility] [address] [place] [address] [phone]  [facility] [address] [place] [address] [phone]  [facility] [place] [phone]  [facility] EFAX [address] [place], [place] [phone]  [facility] [address] [place] [address] [phone]  [facility] [address] [place] [address] [phone]  [facility] [address] [place] [address] [phone]      Situation ongoing, CSW to continue following and update chart as more information becomes available.      [place] Heinz Eckert, MSW, LCSW  06/22/2023 12:54 PM

## 2023-06-22 NOTE — Progress Notes (Signed)
Patient came to me and stated she didn't know what was going on with her and that she is not feeling well. She was warm and clammy and just went limp. Carmen, NT came and we sat her in the chair. Called Dr. Lynelle Doctor and Milagros Loll, charge RN. Patient has now been transferred to [facility] for closer monitoring.

## 2023-06-22 NOTE — ED Provider Notes (Signed)
Emergency Medicine Observation Re-evaluation Note  I was called to bedside as the patient has been complaining of pain in her upper abdomen.  She has had a couple episodes of nausea and vomiting today.  She feels a burning discomfort in her upper abdomen.  She has had prior gallbladder surgery Physical Exam  BP (!) 159/98 (BP Location: Right Arm)   Pulse (!) 109   Temp 99 F (37.2 C) (Oral)   Resp (!) 24   LMP 06/13/2023 (Approximate)   SpO2 99%  Physical Exam General: appears to be in pain Cardiac: regular rate Lungs: normal effort ABD:  soft, no rebound or guarding, no mass  ED Course / MDM  EKG:   Clinical Course as of 06/22/23 2211  Tue Jun 21, 2023  1102 Laboratory workup completed.  Pregnancy test negative.  No other significant abnormalities on labs.  Medically cleared for psychiatric evaluation.  Patient has been moved back to room 35 [MP]  Wed Jun 22, 2023  2133 CBC shows stable hemoglobin [JK]  2134 CT scan showed mild thickening of the ascending colon which may be secondary to poor bowel distention.  Patient's had prior cholecystectomy.  No other acute process noted [JK]    Clinical Course User Index [JK] Linwood Dibbles, MD [MP] Royanne Foots, DO     Plan  Current plan is for Will give dose of antacids.  Will repeat LFTs and lipase.  Will check.   Patient's metabolic panel repeated today is unremarkable.  Lipase is normal.  Abdominal x-ray does not show any acute process.  8:07 PM now notified that patient was feeling lightheaded.  Repeat vitals show that she is slightly tachycardic but has normal blood pressure.  Patient will be moved to main ED for reassessment. Abdomen is still soft no guarding but patient is still complaining of persistent discomfort.  Will give her a dose of IV fluids antiemetics and plan on CT imaging.  Patient denies any alcohol use but does report regular marijuana use  CT scan does not show any definite acute abnormality.  I do not think  the mild thickening of ascending colon is clinically significant.  Patient's symptoms improved.  She is medically cleared for psychiatric disposition.   Linwood Dibbles, MD 06/22/23 2211

## 2023-06-22 NOTE — ED Notes (Signed)
Pt seems to have pain and crying on the bed holding her abdomen.

## 2023-06-23 ENCOUNTER — Telehealth: Payer: Self-pay

## 2023-06-23 ENCOUNTER — Encounter: Payer: MEDICAID | Admitting: Internal Medicine

## 2023-06-23 DIAGNOSIS — Z7289 Other problems related to lifestyle: Secondary | ICD-10-CM | POA: Diagnosis not present

## 2023-06-23 LAB — CBG MONITORING, ED: Glucose-Capillary: 96 mg/dL (ref 70–99)

## 2023-06-23 MED ORDER — LORAZEPAM 2 MG/ML IJ SOLN: 2.0000 mg | Freq: Once | INTRAMUSCULAR | Status: DC

## 2023-06-23 MED ORDER — LORAZEPAM 1 MG PO TABS
1.0000 mg | ORAL_TABLET | Freq: Once | ORAL | Status: AC
  Administered 2023-06-23: 1 mg via ORAL
  Filled 2023-06-23: qty 1

## 2023-06-23 MED ORDER — ONDANSETRON 4 MG PO TBDP
4.0000 mg | ORAL_TABLET | Freq: Three times a day (TID) | ORAL | Status: DC | PRN
  Filled 2023-06-23: qty 1
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
## 2023-06-23 NOTE — Telephone Encounter (Unsigned)
Copied from CRM [id number]. Topic: Clinical - Medication Question >> Jun 23, 2023  8:28 AM Kathryne Eriksson wrote: Reason for CRM: Depo Shot

## 2023-06-23 NOTE — ED Notes (Signed)
Patient took a shower at 2 pm

## 2023-06-23 NOTE — Progress Notes (Signed)
Brief Nutrition Note  Consult received to add Ensure with comment "Patient had tooth extraction and is unable to chew athis time.  She is requesting Ensure supplement".  Ensure Plus High protein has been added and patient is consuming.   Will sign off. Please re-consult if needed.  Shelle Iron RD, LDN Contact via [employer].

## 2023-06-23 NOTE — ED Provider Notes (Signed)
Emergency Medicine Observation Re-evaluation Note  Amy Wall is a 18 y.o. female, seen on rounds today.  Pt initially presented to the ED for complaints of Suicidal Currently, the patient is resting comfortably.  Physical Exam  BP 111/74 (BP Location: Right Arm)   Pulse 67   Temp 98.3 F (36.8 C) (Oral)   Resp 16   LMP 06/13/2023 (Approximate)   SpO2 97%  Physical Exam General: NAD Lungs: bilateral chest rise, no tachypnea Psych: resting comfortably  ED Course / MDM  EKG:EKG Interpretation Date/Time:  Wednesday June 22 2023 20:30:12 EST Ventricular Rate:  93 PR Interval:  144 QRS Duration:  84 QT Interval:  374 QTC Calculation: 465 R Axis:   20  Text Interpretation: Normal sinus rhythm Normal ECG When compared with ECG of 16-Jun-2023 13:28, PREVIOUS ECG IS PRESENT Confirmed by Linwood Dibbles [phone]) on 06/22/2023 8:39:40 PM  I have reviewed the labs performed to date as well as medications administered while in observation.  Recent changes in the last 24 hours include seen by psych, recommend inpatient.  Plan  Current plan is for inpt psych placement.    Melene Plan, DO 06/23/23 [phone]

## 2023-06-23 NOTE — Telephone Encounter (Signed)
Patient has not been seen by PCP since September 2024. Her last documented injection was 02/01/23. She was supposed to come back in December for injection, but did not. She is out of time frame to receive injection. She will need to have appt and urine pregnancy test done before next injection. If she is currently hospitalized, they can have the attending physician that is over her care order this and have it administered in the hospital.

## 2023-06-23 NOTE — Progress Notes (Signed)
LCSW Progress Note  [id number]   Amy Wall  06/23/2023  1:08 PM  Description:   Inpatient Psychiatric Referral  Patient was recommended inpatient per [facility], PMHNP . There are no available beds at [facility], per [facility] Malva Limes RN. Patient was referred to the following out of network facilities:    [facility] Provider Address Phone Fax  [facility] [address] [place] [address] [phone]  [facility] [address] [place] [address] [phone]  [facility] [address] [place] [address] [phone]  [facility] [address] [place], [place] [address] [phone]  [facility] [address] [place] [address] [phone]  [facility] [address], [place] [address] [phone]  [facility] [address] [place] [address] [phone]  [facility] [address], [place] [address] [phone]  [facility] [place] [phone]  [facility] EFAX [address] [place], [place] [phone]  [facility] [address] [place] [address] [phone]  [facility] [address] [place] [address] [phone]  [facility] [employer] [address], [place] [address] [phone]      Situation ongoing, CSW to continue following and update chart as more information becomes available.     [place] Mayla Biddy, MSW, LCSW  06/23/2023 1:08 PM

## 2023-06-23 NOTE — ED Notes (Addendum)
Placed 2 bags of patient's belongings in cabinet above nursing station close to Pony A on the second shelf.

## 2023-06-23 NOTE — BH Assessment (Addendum)
Social Work Disposition Note:   @ 10:21 PM, per [facility], Intake Coordinator with [facility], the patient has been accepted for admission on 06/24/2023, any time after 8 AM. A room number has not been assigned at this time. The accepting provider is Dr. Loni Beckwith, MD. The nurse report contact number is [phone]. Cristal Deer, RN, provided disposition updates.

## 2023-06-23 NOTE — BH Assessment (Addendum)
Social Worker Disposition Note:    2/13/205, per [facility] provider, Darrelyn Hillock, NP, patient continues to be recommend inpatient psychiatric treatment.  @ 1946, during the PM shift, hospital referrals were re-faxed to facilities for consideration of bed placement. See hospitals listed below:   Destination  Service Provider Request Status Services Address Phone Fax Patient Preferred  [facility] Pending - Request Sent -- [address], [place] [address] [phone] --  [facility] Pending - Request Sent -- [address] [place] [address] [phone] --  [facility] Pending - Request Sent -- [address], [place] [address] [phone] --  [facility] Pending - Request Sent -- [address] [place], [place] [address] [phone] --  [facility] Pending - Request Sent -- [address] [place] [address] [phone] --  [facility] Pending - Request Sent -- [address] [place] [address] [phone] --  [facility] Pending - Request Sent -- [address], [place] [address] [phone] --  [facility] Pending - Request Sent -- [address] [place] [address] [phone] --  [facility] Pending - Request Sent -- [place] [phone] --  [facility] Pending - Request Sent -- [address] [name] [phone] --  [facility] Pending - Request Sent -- [address] [place] [address] [phone] --  [facility] Pending - Request Sent -- [address] [place] [address] [phone] --  [facility] Pending -  Request Sent -- [address], [place] [address] [phone] --

## 2023-06-23 NOTE — ED Notes (Signed)
Pt became upset when notified that she will not be discharged tonight. Pt stated "you guys lied to me", "I want to go home", "you guys are not treating me fair, I just want to vent". Security was called as a precaution. This nurse along with an officer was able to allow pt to vent her frustration and offered to remove her from hall bed to a room pt agree and relocated willing without any further issues. Pt has requested to speak to psych NP first thing in the morning. This nurse advised pt that an Epic message will be sent to NP.
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
## 2023-06-24 NOTE — ED Notes (Signed)
Pt declined breakfast tray.

## 2023-06-24 NOTE — Telephone Encounter (Signed)
Legal guardian, Amy Wall, was actually asking if the medication is here at the office or will she have to pick it up from the pharm and bring with her to the visit. Advised that we have the medication/depo inj here. Pt is still hospitalized. Amy Wall will call to RS upon DC.

## 2023-06-24 NOTE — Progress Notes (Signed)
LCSW Progress Note  [id number]   Amy Wall  06/24/2023  10:13 AM  Description:   Inpatient Psychiatric Referral  Patient was recommended inpatient per Mcneil Sober NP. There are no available beds at [facility], per [name] RN. Patient was referred to the following out of network facilities:  [facility] Provider Address Phone Fax  [facility] [address] [place] [address] [phone]  [facility] [address] [place] [address] [phone]  [facility] [address], [place] [address] [phone]  [facility] [address] [place], [place] [address] [phone]  [facility] [address] [place] [address] [phone]  [facility] [address] [place] [address] [phone]  [facility] [address] [place] [address] [phone]  [facility] [address] [place] [address] [phone]  [facility] [place] [phone]  [facility] EFAX [address] [place], [place] [phone]  [facility] [address], [place] [address] [phone]  [facility] [address] [place] [address] [phone]       Situation ongoing, CSW to continue following and update chart as more information becomes available.     [place] Damyan Corne, MSW, LCSW  06/24/2023 10:13 AM

## 2023-06-24 NOTE — ED Notes (Signed)
Called [facility] Pl[facility] to give report, spoke to [place], she stated they will take report once transport has arrived to pick up patient.  [facility] contacted to transport pt with estimated arrival between 1400-1500

## 2023-06-24 NOTE — ED Notes (Signed)
Cleaned wound on L cheek and applied bactrim ointment and bandaid

## 2023-06-24 NOTE — ED Notes (Signed)
Amy Wall with [facility] called to advise that they do not currently have a bed for this patient. Will follow up when one becomes available.

## 2023-06-24 NOTE — Progress Notes (Signed)
CSW spoke to [place] at [facility], the patient was denied as there was a mix up with employee staff on acceptance. Patient was denied due to high acuity reasons.   [place] Manjot Beumer LCSW-A   06/24/2023 10:09 AM

## 2023-06-24 NOTE — ED Provider Notes (Signed)
Emergency Medicine Observation Re-evaluation Note  Amy Wall is a 18 y.o. female, seen on rounds today.  Pt initially presented to the ED for complaints of Suicidal Currently, the patient is resting comfortably.  Physical Exam  BP (!) 128/93 (BP Location: Left Arm)   Pulse 72   Temp 98.8 F (37.1 C) (Oral)   Resp 16   LMP 06/13/2023 (Approximate)   SpO2 100%  Physical Exam Vitals and nursing note reviewed.  Constitutional:      General: She is not in acute distress.    Appearance: She is well-developed.  HENT:     Head: Normocephalic and atraumatic.  Eyes:     Conjunctiva/sclera: Conjunctivae normal.  Pulmonary:     Effort: Pulmonary effort is normal. No respiratory distress.  Musculoskeletal:        General: No swelling.     Cervical back: Neck supple.  Skin:    General: Skin is warm and dry.     Capillary Refill: Capillary refill takes less than 2 seconds.  Neurological:     Mental Status: She is alert.  Psychiatric:        Mood and Affect: Mood normal.      ED Course / MDM  EKG:EKG Interpretation Date/Time:  Wednesday June 22 2023 20:30:12 EST Ventricular Rate:  93 PR Interval:  144 QRS Duration:  84 QT Interval:  374 QTC Calculation: 465 R Axis:   20  Text Interpretation: Normal sinus rhythm Normal ECG When compared with ECG of 16-Jun-2023 13:28, PREVIOUS ECG IS PRESENT Confirmed by Linwood Dibbles [phone]) on 06/22/2023 8:39:40 PM  I have reviewed the labs performed to date as well as medications administered while in observation.  Recent changes in the last 24 hours include TTS evaluation, accepted to [facility].  Plan  Current plan is for transfer to [facility] today.    Glendora Score, MD 06/24/23 [phone]

## 2023-06-24 NOTE — ED Notes (Signed)
Pt made 1 of 3 calls

## 2023-06-24 NOTE — Progress Notes (Addendum)
Pt has been accepted to [facility] on 06/24/2023 Bed assignment: DELTA UNIT   Pt meets inpatient criteria per: Mcneil Sober NP  Attending Physician will be: Joylene Igo MD  Report can be called to: [phone]  Pt can arrive ASAP NOW   Care Team Notified: Liborio Nixon NP, [name] NT, Charlsie Merles RN  [place] Rayvion Stumph LCSW-A   06/24/2023 11:03 AM

## 2023-06-27 ENCOUNTER — Telehealth: Payer: Self-pay

## 2023-06-27 NOTE — Transitions of Care (Post Inpatient/ED Visit) (Signed)
06/27/2023  Name: Amy Wall MRN: [id number] DOB: February 15, 2006  Today's TOC FU Call Status: Today's TOC FU Call Status:: Successful TOC FU Call Completed TOC FU Call Complete Date: 06/27/23 Patient's Name and Date of Birth confirmed.  Transition Care Management Follow-up Telephone Call Date of Discharge: 06/20/23 Discharge Facility: [facility]) Type of Discharge: Emergency Department Reason for ED Visit: Other: (suicidal ideations) How have you been since you were released from the hospital?: Same Any questions or concerns?: Yes Patient Questions/Concerns:: Mom is requesting evalutation for hypertension, diabtetes,  PHQ-9,  ETC Patient Questions/Concerns Addressed: Provided Patient Educational Materials  Items Reviewed: Did you receive and understand the discharge instructions provided?: Yes Medications obtained,verified, and reconciled?: Yes (Medications Reviewed) Any new allergies since your discharge?: No Dietary orders reviewed?: NA Do you have support at home?: Yes People in Home: parent(s) Name of Support/Comfort Primary Source: Mother  Medications Reviewed Today: Medications Reviewed Today     Reviewed by Leroy Kennedy, CMA (Certified Medical Assistant) on 06/27/23 at 0920  Med List Status: <None>   Medication Order Taking? Sig Documenting Provider Last Dose Status Informant  acetaminophen (TYLENOL) 500 MG tablet [id number] Yes Take 2 tablets (1,000 mg total) by mouth every 6 (six) hours for 7 days  Taking Active Multiple Informants           Med Note Antony Madura, Arn Medal   Wed Jun 22, 2023  2:49 PM) Med listed by "Ms. Janee Morn"- patient said this was taken in the past week  albuterol (VENTOLIN HFA) 108 (90 Base) MCG/ACT inhaler [id number] Yes Inhale 2 puffs into the lungs every 6 (six) hours as needed for wheezing or shortness of breath. Del Nigel Berthold, FNP Taking Active Multiple Informants           Med Note Antony Madura, KATHY N   Wed Jun 22, 2023  3:13 PM)  Med listed by "Ms. Janee Morn"- patient said this was used in the past week  amoxicillin (AMOXIL) 500 MG capsule [id number] Yes Take 1 capsule (500 mg total) by mouth 3 (three) times daily for 7 days  Taking Active Multiple Informants           Med Note Antony Madura, Arn Medal   Wed Jun 22, 2023  2:48 PM) Med listed by "Ms. Janee Morn"- patient said this was taken in the past week  ARIPiprazole (ABILIFY) 15 MG tablet [id number] Yes Take 15 mg by mouth at bedtime. [provider] Taking Active Multiple Informants           Med Note Antony Madura, Arn Medal   Wed Jun 22, 2023  2:48 PM) Med listed by "Abby"- patient said this was taken in the past week  Bismuth 262 MG CHEW [id number] Yes Chew 1 tablet by mouth in the morning, at noon, in the evening, and at bedtime. Jenel Lucks, MD Taking Active Other  budesonide-formoterol [facility]) 80-4.5 MCG/ACT inhaler [id number] Yes Inhale 2 puffs into the lungs 2 (two) times daily. Del Nigel Berthold, FNP Taking Active Multiple Informants           Med Note Antony Madura, KATHY N   Wed Jun 22, 2023  3:13 PM) Med listed by "Ms. Janee Morn"- patient said this was used in the past week  chlorhexidine (PERIDEX) 0.12 % solution [id number] Yes Swish for 30 seconds then spit, use twice daily  Taking Active Multiple Informants           Med Note Antony Madura, Arn Medal   Wed Jun 22, 2023  3:14 PM) Med listed by "Ms. Janee Morn"- patient said this was used in the past week  dexamethasone (DECADRON) 4 MG tablet [id number] Yes Take 1 tablet (4 mg total) by mouth 3 (three) times daily as needed for swelling.  Taking Active   doxycycline (VIBRAMYCIN) 100 MG capsule [id number] Yes Take 1 capsule (100 mg total) by mouth 2 (two) times daily. Jenel Lucks, MD Taking Active   EPINEPHrine 0.3 mg/0.3 mL IJ SOAJ injection [id number] Yes Inject into  mid outer thigh 1 time for 1 dose.  Taking Active   hydrOXYzine (ATARAX) 25 MG tablet [id number] Yes Take 1 tablet (25 mg total) by mouth 3 (three) times  daily as needed for anxiety. Motley-Mangrum, Ezra Sites, PMHNP Taking Active            Med Note Antony Madura, Arn Medal   Wed Jun 22, 2023  2:48 PM) Med listed by "Abby"- patient said this was taken in the past week  ibuprofen (ADVIL) 400 MG tablet [id number] Yes Take 1 tablet (400 mg total) by mouth every 4 (four) hours  for pain  Taking Active Multiple Informants           Med Note Antony Madura, Arn Medal   Wed Jun 22, 2023  3:17 PM) Med listed by "Ms. Janee Morn"- patient said this was taken in the past week  ibuprofen (ADVIL) 600 MG tablet [id number] Yes Take 1 tablet (600 mg total) by mouth a night time as needed.  Taking Active   medroxyPROGESTERone (DEPO-PROVERA) 150 MG/ML injection [id number] Yes Inject 1 mL (150 mg total) into the muscle every 3 (three) months. Adline Potter, NP Taking Active   metroNIDAZOLE (FLAGYL) 250 MG tablet [id number] Yes Take 1 tablet (250 mg total) by mouth 3 (three) times daily. Jenel Lucks, MD Taking Active Other  metroNIDAZOLE (FLAGYL) 500 MG tablet [id number] Yes Take 1 tablet (500 mg total) by mouth 2 (two) times daily. Jeannie Fend, PA-C Taking Active Other  ondansetron (ZOFRAN-ODT) 4 MG disintegrating tablet [id number] Yes Take 1 tablet (4 mg total) by mouth every 8 (eight) hours as needed for nausea or vomiting. Jeannie Fend, PA-C Taking Active   pantoprazole (PROTONIX) 40 MG tablet [id number] Yes Take 1 tablet (40 mg total) by mouth 2 (two) times daily before a meal. Jenel Lucks, MD Taking Active   potassium chloride (KLOR-CON) 10 MEQ tablet [id number] Yes Take 1 tablet (10 mEq total) by mouth daily. Jeannie Fend, PA-C Taking Active   promethazine (PHENERGAN) 25 MG suppository [id number] Yes Place 1 suppository (25 mg total) rectally every 6 (six) hours as needed for nausea or vomiting. Jeannie Fend, PA-C Taking Active Other  promethazine (PHENERGAN) 25 MG tablet [id number] Yes Take 0.5 tablets (12.5 mg total) by mouth every 6 (six) hours as needed for  nausea or vomiting. Jenel Lucks, MD Taking Active Other  sertraline (ZOLOFT) 100 MG tablet [id number] Yes Take 1 tablet (100 mg total) by mouth daily. Motley-Mangrum, Ezra Sites, PMHNP Taking Active Multiple Informants           Med Note Antony Madura, Arn Medal   Wed Jun 22, 2023  2:46 PM) Med listed by "Abby"- patient said this was taken in the past week  topiramate (TOPAMAX) 25 MG tablet [id number] Yes Take 1 tablet (25 mg total) by mouth 2 (two) times daily.  Taking Active   traZODone (DESYREL) 100 MG tablet [id number] Yes Take 1 tablet (100 mg total) by mouth every night. Motley-Mangrum, Ezra Sites,  PMHNP Taking Active Multiple Informants           Med Note Antony Madura, Arn Medal   Wed Jun 22, 2023  2:46 PM) Med listed by "Abby"- patient said this was taken in the past week            Home Care and Equipment/Supplies: Were Home Health Services Ordered?: No Any new equipment or medical supplies ordered?: No  Functional Questionnaire: Do you need assistance with bathing/showering or dressing?: No Do you need assistance with meal preparation?: No Do you need assistance with eating?: No Do you have difficulty maintaining continence: No Do you need assistance with getting out of bed/getting out of a chair/moving?: No Do you have difficulty managing or taking your medications?: No  Follow up appointments reviewed: PCP Follow-up appointment confirmed?: NA Follow-up Provider: Salvatore Decent NP [facility] Follow-up appointment confirmed?: NA Do you need transportation to your follow-up appointment?: No Do you understand care options if your condition(s) worsen?: Yes-patient verbalized understanding    SIGNATURE Jodelle Green, RMA

## 2023-07-04 NOTE — Telephone Encounter (Signed)
 Copied from CRM [id number]. Topic: Appointments - Scheduling Inquiry for Clinic >> Jul 04, 2023  3:07 PM Corin V wrote: Reason for CRM: Patient's social worker scheduled her to be seen 07/12/23 for an office visit and she wanted to know if the Depo shot could also be given at that visit for the patient. Please call her to schedule an additional visit if needed and if the Depo shot is available at clinic.

## 2023-07-07 NOTE — Telephone Encounter (Signed)
 Attempted to advise social worker, [name], on 06/21/23, however VM was full.. CPE, depo, and asthma concern can be addresses in the same visit per Salvatore Decent,  NP. See 06/17/23 TE.  Spoke with Hansel Starling, pt already an acute visit scheduled for 07/12/23 that she was not aware of. Visit changed to CPE. [name] aware.

## 2023-07-11 ENCOUNTER — Emergency Department (HOSPITAL_COMMUNITY)
Admission: EM | Admit: 2023-07-11 | Discharge: 2023-07-11 | Disposition: A | Discharge status: Home / Self Care | Payer: MEDICAID | Attending: Emergency Medicine | Admitting: Emergency Medicine

## 2023-07-11 ENCOUNTER — Emergency Department (HOSPITAL_COMMUNITY): Payer: MEDICAID

## 2023-07-11 DIAGNOSIS — Z7951 Long term (current) use of inhaled steroids: Secondary | ICD-10-CM | POA: Diagnosis not present

## 2023-07-11 DIAGNOSIS — J45909 Unspecified asthma, uncomplicated: Secondary | ICD-10-CM | POA: Insufficient documentation

## 2023-07-11 DIAGNOSIS — R079 Chest pain, unspecified: Secondary | ICD-10-CM | POA: Insufficient documentation

## 2023-07-11 DIAGNOSIS — Z91010 Allergy to peanuts: Secondary | ICD-10-CM | POA: Diagnosis not present

## 2023-07-11 DIAGNOSIS — S6991XA Unspecified injury of right wrist, hand and finger(s), initial encounter: Secondary | ICD-10-CM | POA: Diagnosis present

## 2023-07-11 DIAGNOSIS — S60221A Contusion of right hand, initial encounter: Secondary | ICD-10-CM | POA: Diagnosis not present

## 2023-07-11 DIAGNOSIS — W228XXA Striking against or struck by other objects, initial encounter: Secondary | ICD-10-CM | POA: Insufficient documentation

## 2023-07-11 MED ORDER — IBUPROFEN 800 MG PO TABS
800.0000 mg | ORAL_TABLET | Freq: Once | ORAL | Status: AC
  Administered 2023-07-11: 800 mg via ORAL
  Filled 2023-07-11: qty 1

## 2023-07-11 NOTE — ED Triage Notes (Signed)
 Pt reports that she is having right side chest pain x since her cardiac arrest 2 weeks ago, states it was r/t to stress.   Pt requesting someone to look at her right hand , reports that she punch a wall today. Pt reports can move fingers but nit make a fist.   Pt c/o dizziness x 3 days, and then states she has h. Plyori

## 2023-07-11 NOTE — ED Provider Notes (Signed)
 [place] [facility] AT [facility] Provider Note   CSN: [id number] Arrival date & time: 07/11/23  0414     History  Chief Complaint  Patient presents with   Chest Pain    Amy Wall is a 18 y.o. female.  18 year old female with a history of major depressive disorder, pseudoseizures, oppositional defiant disorder, asthma presents to the emergency department for multiple complaints.  She states that she has been experiencing pain in her right chest which she describes as stabbing.  This has been intermittent for the past 4 to 5 days.  She has not taken any medications for her symptoms.  Denies associated fevers, vomiting, trauma.  Also reporting right hand pain after punching a wall yesterday.  She is right-hand dominant.  Has not taken any medications for this.  Has difficulty making a fist secondary to discomfort.  No associated numbness or paresthesias.  The history is provided by the patient. No language interpreter was used.  Chest Pain      Home Medications Prior to Admission medications   Medication Sig Start Date End Date Taking? Authorizing Provider  acetaminophen (TYLENOL) 500 MG tablet Take 2 tablets (1,000 mg total) by mouth every 6 (six) hours for 7 days 06/14/23     albuterol (VENTOLIN HFA) 108 (90 Base) MCG/ACT inhaler Inhale 2 puffs into the lungs every 6 (six) hours as needed for wheezing or shortness of breath. 07/08/22   Del Newman Nip, Tenna Child, FNP  amoxicillin (AMOXIL) 500 MG capsule Take 1 capsule (500 mg total) by mouth 3 (three) times daily for 7 days 06/14/23     ARIPiprazole (ABILIFY) 15 MG tablet Take 15 mg by mouth at bedtime.    [provider]  Bismuth 262 MG CHEW Chew 1 tablet by mouth in the morning, at noon, in the evening, and at bedtime. 03/22/23   Jenel Lucks, MD  budesonide-formoterol (SYMBICORT) 80-4.5 MCG/ACT inhaler Inhale 2 puffs into the lungs 2 (two) times daily. 07/08/22   Del Nigel Berthold,  FNP  chlorhexidine (PERIDEX) 0.12 % solution Swish for 30 seconds then spit, use twice daily 06/14/23     dexamethasone (DECADRON) 4 MG tablet Take 1 tablet (4 mg total) by mouth 3 (three) times daily as needed for swelling. 06/14/23     doxycycline (VIBRAMYCIN) 100 MG capsule Take 1 capsule (100 mg total) by mouth 2 (two) times daily. 03/22/23   Jenel Lucks, MD  EPINEPHrine 0.3 mg/0.3 mL IJ SOAJ injection Inject into  mid outer thigh 1 time for 1 dose. 12/13/22     hydrOXYzine (ATARAX) 25 MG tablet Take 1 tablet (25 mg total) by mouth 3 (three) times daily as needed for anxiety. 02/12/23   Motley-Mangrum, Ezra Sites, PMHNP  ibuprofen (ADVIL) 400 MG tablet Take 1 tablet (400 mg total) by mouth every 4 (four) hours  for pain 06/14/23     ibuprofen (ADVIL) 600 MG tablet Take 1 tablet (600 mg total) by mouth a night time as needed. 01/24/23     medroxyPROGESTERone (DEPO-PROVERA) 150 MG/ML injection Inject 1 mL (150 mg total) into the muscle every 3 (three) months. 07/29/22   Adline Potter, NP  metroNIDAZOLE (FLAGYL) 250 MG tablet Take 1 tablet (250 mg total) by mouth 3 (three) times daily. 03/22/23   Jenel Lucks, MD  metroNIDAZOLE (FLAGYL) 500 MG tablet Take 1 tablet (500 mg total) by mouth 2 (two) times daily. 03/05/23   Jeannie Fend, PA-C  ondansetron (ZOFRAN-ODT)  4 MG disintegrating tablet Take 1 tablet (4 mg total) by mouth every 8 (eight) hours as needed for nausea or vomiting. 03/05/23   Jeannie Fend, PA-C  pantoprazole (PROTONIX) 40 MG tablet Take 1 tablet (40 mg total) by mouth 2 (two) times daily before a meal. 03/22/23   Jenel Lucks, MD  potassium chloride (KLOR-CON) 10 MEQ tablet Take 1 tablet (10 mEq total) by mouth daily. 03/05/23   Jeannie Fend, PA-C  promethazine (PHENERGAN) 25 MG suppository Place 1 suppository (25 mg total) rectally every 6 (six) hours as needed for nausea or vomiting. 03/08/23   Jeannie Fend, PA-C  promethazine (PHENERGAN) 25 MG tablet Take  0.5 tablets (12.5 mg total) by mouth every 6 (six) hours as needed for nausea or vomiting. 01/21/23   Jenel Lucks, MD  sertraline (ZOLOFT) 100 MG tablet Take 1 tablet (100 mg total) by mouth daily. 02/12/23   Motley-Mangrum, Ezra Sites, PMHNP  topiramate (TOPAMAX) 25 MG tablet Take 1 tablet (25 mg total) by mouth 2 (two) times daily. 12/11/22     traZODone (DESYREL) 100 MG tablet Take 1 tablet (100 mg total) by mouth every night. 02/12/23   Motley-Mangrum, Ezra Sites, PMHNP      Allergies    Chocolate, Peanut-containing drug products, Pineapple, Shellfish allergy, Strawberry extract, and Charentais melon (french melon)    Review of Systems   Review of Systems  Cardiovascular:  Positive for chest pain.  Ten systems reviewed and are negative for acute change, except as noted in the HPI.    Physical Exam Updated Vital Signs BP 126/80 (BP Location: Right Arm)   Pulse 75   Temp 99.1 F (37.3 C) (Oral)   Resp 16   LMP 06/13/2023 (Approximate)   SpO2 100%   Physical Exam Vitals and nursing note reviewed.  Constitutional:      General: She is not in acute distress.    Appearance: She is well-developed. She is not diaphoretic.     Comments: Nontoxic-appearing and in no acute distress  HENT:     Head: Normocephalic and atraumatic.  Eyes:     General: No scleral icterus.    Conjunctiva/sclera: Conjunctivae normal.  Cardiovascular:     Rate and Rhythm: Normal rate and regular rhythm.     Pulses: Normal pulses.     Comments: Distal radial pulse 2+ in the right upper extremity Pulmonary:     Effort: Pulmonary effort is normal. No respiratory distress.     Breath sounds: No stridor.     Comments: Respirations even and unlabored Musculoskeletal:        General: Swelling and tenderness present.     Cervical back: Normal range of motion.     Comments: Mild soft tissue swelling noted proximal to the right third and fourth MCP joints.  Associated tenderness to palpation to the dorsum of the  right hand.  No crepitus noted.  Skin:    General: Skin is warm and dry.     Coloration: Skin is not pale.     Findings: No erythema or rash.  Neurological:     Mental Status: She is alert and oriented to person, place, and time.     Coordination: Coordination normal.  Psychiatric:        Behavior: Behavior normal.     ED Results / Procedures / Treatments   Labs (all labs ordered are listed, but only abnormal results are displayed) Labs Reviewed - No data to display  EKG EKG  Interpretation Date/Time:  Monday July 11 2023 04:26:39 EST Ventricular Rate:  75 PR Interval:  139 QRS Duration:  97 QT Interval:  396 QTC Calculation: 443 R Axis:   18  Text Interpretation: Sinus rhythm No significant change since last tracing Confirmed by Melene Plan [phone]) on 07/11/2023 4:37:14 AM  Radiology DG Hand Complete Right Result Date: 07/11/2023 CLINICAL DATA:  Hand injury. Patient punched a wall with decreased mobility and cannot form a fist. EXAM: RIGHT HAND - COMPLETE 3+ VIEW COMPARISON:  None Available. FINDINGS: There is no evidence of fracture or dislocation. There is no evidence of arthropathy or other focal bone abnormality. There appears to be some soft tissue swelling posteriorly. IMPRESSION: No acute bony abnormality. Electronically Signed   By: Kennith Center M.D.   On: 07/11/2023 05:35   DG Chest 2 View Result Date: 07/11/2023 CLINICAL DATA:  Chest pain EXAM: CHEST - 2 VIEW COMPARISON:  03/04/2023 FINDINGS: The lungs are clear without focal pneumonia, edema, pneumothorax or pleural effusion. The cardiopericardial silhouette is within normal limits for size. No acute bony abnormality. IMPRESSION: No active cardiopulmonary disease. Electronically Signed   By: Kennith Center M.D.   On: 07/11/2023 05:34    Procedures Procedures    Medications Ordered in ED Medications  ibuprofen (ADVIL) tablet 800 mg (800 mg Oral Given 07/11/23 0459)    ED Course/ Medical Decision Making/ A&P                                  Medical Decision Making Amount and/or Complexity of Data Reviewed Radiology: ordered.  Risk Prescription drug management.   This patient presents to the ED for concern of R sided chest pain, this involves an extensive number of treatment options, and is a complaint that carries with it a high risk of complications and morbidity.  The differential diagnosis includes PTX vs PNA vs pleural effusion vs MSK etiology vs anxiety.   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 No traumatic pathology on hand imaging. I agree with the radiologist interpretation   Cardiac Monitoring:  The patient was maintained on a cardiac monitor.  I personally viewed and interpreted the cardiac monitored which showed an underlying rhythm of: NSR   Medicines ordered and prescription drug management:  I ordered medication including ibuprofen for pain  Reevaluation of the patient after these medicines showed that the patient improved I have reviewed the patients home medicines and have made adjustments as needed   Test Considered:  Troponin - symptoms atypical for ACS; felt unlikely given age and lack of cardiac risk factors.   Problem List / ED Course:  Patient presents to the emergency department for evaluation of R sided chest pain.  Low suspicion for emergent cardiac etiology given reassuring workup today.  EKG is nonischemic and stable.   Chest x-ray without evidence of mediastinal widening to suggest dissection.  No pneumothorax, pneumonia, pleural effusion.   Patient also with R hand pain  after punching a wall yesterday. Patient neurovascularly intact on exam. Finger to thumb opposition intact. Imaging negative for fracture, dislocation, bony deformity. No  erythema, heat to touch to the affected area; no concern for septic joint. Compartments in the affected extremity are soft.  Plan for supportive management including RICE and NSAIDs; primary care follow up as needed. R   Reevaluation:  After the interventions noted above, I reevaluated the patient and found that they have :improved   Social Determinants of Health:  Lives independently   Dispostion:  After consideration of the diagnostic results and the patients response to treatment, I feel that the patent would benefit from outpatient PCP f/u for reassessment. Return precautions discussed and provided. Patient discharged in stable condition with no unaddressed concerns.          Final Clinical Impression(s) / ED Diagnoses Final diagnoses:  Nonspecific chest pain  Contusion of dorsum of right hand    Rx / DC Orders ED Discharge Orders     None         Antony Madura, PA-C 07/11/23 0603    Melene Plan, DO 07/11/23 [phone]

## 2023-07-11 NOTE — Discharge Instructions (Addendum)
 Your patient in the ED was reassuring.  We recommend the use of ibuprofen or Aleve for pain control.  Apply ice to your hand 3-4 times per day to limit swelling.  You may use a brace for stability and comfort, if desired.  Follow-up with a primary doctor.

## 2023-07-12 ENCOUNTER — Telehealth: Payer: Self-pay

## 2023-07-12 ENCOUNTER — Encounter: Payer: MEDICAID | Admitting: Internal Medicine

## 2023-07-12 NOTE — Transitions of Care (Post Inpatient/ED Visit) (Signed)
   07/12/2023  Name: Amy Wall MRN: [id number] DOB: 05/15/2005  Today's TOC FU Call Status: Today's TOC FU Call Status:: Unsuccessful Call (1st Attempt) Unsuccessful Call (1st Attempt) Date: 07/12/23  Attempted to reach the patient regarding the most recent Inpatient/ED visit.  Follow Up Plan: Additional outreach attempts will be made to reach the patient to complete the Transitions of Care (Post Inpatient/ED visit) call.    Derenda Fennel, RMA

## 2023-07-26 ENCOUNTER — Telehealth: Payer: Self-pay | Admitting: Internal Medicine

## 2023-07-26 ENCOUNTER — Encounter: Payer: MEDICAID | Admitting: Internal Medicine

## 2023-07-26 NOTE — Progress Notes (Deleted)
 Subjective:   Amy Wall 11/20/2005  07/26/2023   CC: No chief complaint on file.   HPI: Amy Wall is a 18 y.o. female who presents for a routine health maintenance exam.  Labs collected at time of visit.    HEALTH SCREENINGS: - Pap smear: not applicable - Mammogram (40+): Not applicable  - Colonoscopy (45+): Not applicable  - Bone Density (65+): Not applicable  - Lung CA screening with low-dose CT:  Not applicable Adults age 31-80 who are current cigarette smokers or quit within the last 15 years. Must have 20 pack year history.   Depression and Anxiety Screen done today and results listed below:     01/20/2023    2:21 PM 08/23/2022    3:40 AM 07/08/2022    3:37 PM 06/28/2022    3:34 PM 04/01/2020    4:32 PM  Depression screen PHQ 2/9  Decreased Interest 0  0 1 2  Down, Depressed, Hopeless 2  1 1  0  PHQ - 2 Score 2  1 2 2   Altered sleeping 3  0 0 2  Tired, decreased energy 2  1 3 1   Change in appetite 1  2 1 2   Feeling bad or failure about yourself  0  0 2 1  Trouble concentrating 0  0 0 2  Moving slowly or fidgety/restless 0  0 0 0  Suicidal thoughts 0  0 0 0  PHQ-9 Score 8  4 8 10   Difficult doing work/chores Not difficult at all  Not difficult at all       Information is confidential and restricted. Go to Review Flowsheets to unlock data.      01/20/2023    2:22 PM 07/08/2022    3:37 PM 06/28/2022    3:35 PM 03/19/2020    9:09 PM  GAD 7 : Generalized Anxiety Score  Nervous, Anxious, on Edge 2 1 1    Control/stop worrying 0 0 0   Worry too much - different things 0 0 0   Trouble relaxing 0 0 0   Restless 0 0 0   Easily annoyed or irritable 0 0 0   Afraid - awful might happen 0 0 0   Total GAD 7 Score 2 1 1    Anxiety Difficulty Not difficult at all Not difficult at all       Information is confidential and restricted. Go to Review Flowsheets to unlock data.    IMMUNIZATIONS: - Tdap: Tetanus vaccination status reviewed: last tetanus  booster within 10 years. - HPV: {Blank single:19197::"Up to date","Administered today","Not applicable","Refused","Given elsewhere"}   Past medical history, surgical history, medications, allergies, family history and social history reviewed with patient today and changes made to appropriate areas of the chart.   Past Medical History:  Diagnosis Date   Anxiety    Asthma    DMDD (disruptive mood dysregulation disorder) (HCC)    Epilepsy (HCC)    pseuodoseizures per chart   HTN (hypertension)    Major depressive disorder    Oppositional defiant disorder    PTSD (post-traumatic stress disorder)    Schizophrenia (HCC)    Seizures (HCC)     Past Surgical History:  Procedure Laterality Date   BACK SURGERY     CHOLECYSTECTOMY, LAPAROSCOPIC      Current Outpatient Medications on File Prior to Visit  Medication Sig   acetaminophen (TYLENOL) 500 MG tablet Take 2 tablets (1,000 mg total) by mouth every 6 (six) hours for 7 days  albuterol (VENTOLIN HFA) 108 (90 Base) MCG/ACT inhaler Inhale 2 puffs into the lungs every 6 (six) hours as needed for wheezing or shortness of breath.   amoxicillin (AMOXIL) 500 MG capsule Take 1 capsule (500 mg total) by mouth 3 (three) times daily for 7 days   ARIPiprazole (ABILIFY) 15 MG tablet Take 15 mg by mouth at bedtime.   Bismuth 262 MG CHEW Chew 1 tablet by mouth in the morning, at noon, in the evening, and at bedtime.   budesonide-formoterol (SYMBICORT) 80-4.5 MCG/ACT inhaler Inhale 2 puffs into the lungs 2 (two) times daily.   chlorhexidine (PERIDEX) 0.12 % solution Swish for 30 seconds then spit, use twice daily   dexamethasone (DECADRON) 4 MG tablet Take 1 tablet (4 mg total) by mouth 3 (three) times daily as needed for swelling.   doxycycline (VIBRAMYCIN) 100 MG capsule Take 1 capsule (100 mg total) by mouth 2 (two) times daily.   EPINEPHrine 0.3 mg/0.3 mL IJ SOAJ injection Inject into  mid outer thigh 1 time for 1 dose.   hydrOXYzine (ATARAX) 25  MG tablet Take 1 tablet (25 mg total) by mouth 3 (three) times daily as needed for anxiety.   ibuprofen (ADVIL) 400 MG tablet Take 1 tablet (400 mg total) by mouth every 4 (four) hours  for pain   ibuprofen (ADVIL) 600 MG tablet Take 1 tablet (600 mg total) by mouth a night time as needed.   medroxyPROGESTERone (DEPO-PROVERA) 150 MG/ML injection Inject 1 mL (150 mg total) into the muscle every 3 (three) months.   metroNIDAZOLE (FLAGYL) 250 MG tablet Take 1 tablet (250 mg total) by mouth 3 (three) times daily.   metroNIDAZOLE (FLAGYL) 500 MG tablet Take 1 tablet (500 mg total) by mouth 2 (two) times daily.   ondansetron (ZOFRAN-ODT) 4 MG disintegrating tablet Take 1 tablet (4 mg total) by mouth every 8 (eight) hours as needed for nausea or vomiting.   pantoprazole (PROTONIX) 40 MG tablet Take 1 tablet (40 mg total) by mouth 2 (two) times daily before a meal.   potassium chloride (KLOR-CON) 10 MEQ tablet Take 1 tablet (10 mEq total) by mouth daily.   promethazine (PHENERGAN) 25 MG suppository Place 1 suppository (25 mg total) rectally every 6 (six) hours as needed for nausea or vomiting.   promethazine (PHENERGAN) 25 MG tablet Take 0.5 tablets (12.5 mg total) by mouth every 6 (six) hours as needed for nausea or vomiting.   sertraline (ZOLOFT) 100 MG tablet Take 1 tablet (100 mg total) by mouth daily.   topiramate (TOPAMAX) 25 MG tablet Take 1 tablet (25 mg total) by mouth 2 (two) times daily.   traZODone (DESYREL) 100 MG tablet Take 1 tablet (100 mg total) by mouth every night.   No current facility-administered medications on file prior to visit.    Allergies  Allergen Reactions   Chocolate Anaphylaxis, Itching, Swelling and Other (See Comments)    Throat closes   Peanut-Containing Drug Products Anaphylaxis, Hives and Itching   Pineapple Anaphylaxis and Hives   Shellfish Allergy Anaphylaxis and Hives   Strawberry Extract Anaphylaxis and Hives   Charentais Melon (French Melon) Swelling and  Other (See Comments)    Numbness and swelling of throat and tongue, also     Social History   Socioeconomic History   Marital status: Single    Spouse name: Not on file   Number of children: 0   Years of education: Not on file   Highest education level: Not on file  Occupational History   Not on file  Tobacco Use   Smoking status: Every Day    Types: Cigars    Passive exposure: Yes   Smokeless tobacco: Never   Tobacco comments:    Lack and mild, marijuana  Vaping Use   Vaping status: Every Day  Substance and Sexual Activity   Alcohol use: Yes    Alcohol/week: 4.0 standard drinks of alcohol    Types: 2 Glasses of wine, 2 Shots of liquor per week   Drug use: Not Currently    Types: Marijuana, Methamphetamines    Comment: daily   Sexual activity: Yes    Birth control/protection: Condom  Other Topics Concern   Not on file  Social History Narrative   She lives with her uncle and aunt.   She has six siblings.   Social Drivers of Health   Financial Resource Strain: Medium Risk (06/28/2022)   Overall Financial Resource Strain (CARDIA)    Difficulty of Paying Living Expenses: Somewhat hard  Food Insecurity: No Food Insecurity (09/23/2022)   Hunger Vital Sign    Worried About Running Out of Food in the Last Year: Never true    Ran Out of Food in the Last Year: Never true  Transportation Needs: No Transportation Needs (09/23/2022)   PRAPARE - Administrator, Civil Service (Medical): No    Lack of Transportation (Non-Medical): No  Physical Activity: Inactive (06/28/2022)   Exercise Vital Sign    Days of Exercise per Week: 0 days    Minutes of Exercise per Session: 0 min  Stress: No Stress Concern Present (06/28/2022)   [employer] of Occupational Health - Occupational Stress Questionnaire    Feeling of Stress : Only a little  Social Connections: Moderately Isolated (06/28/2022)   Social Connection and Isolation Panel [NHANES]    Frequency of Communication  with Friends and Family: Once a week    Frequency of Social Gatherings with Friends and Family: Once a week    Attends Religious Services: More than 4 times per year    Active Member of [employer] or Organizations: Yes    Attends Banker Meetings: 1 to 4 times per year    Marital Status: Never married  Intimate Partner Violence: Not At Risk (09/23/2022)   Humiliation, Afraid, Rape, and Kick questionnaire    Fear of Current or Ex-Partner: No    Emotionally Abused: No    Physically Abused: No    Sexually Abused: No   Social History   Tobacco Use  Smoking Status Every Day   Types: Cigars   Passive exposure: Yes  Smokeless Tobacco Never  Tobacco Comments   Lack and mild, marijuana   Social History   Substance and Sexual Activity  Alcohol Use Yes   Alcohol/week: 4.0 standard drinks of alcohol   Types: 2 Glasses of wine, 2 Shots of liquor per week    Family History  Problem Relation Age of Onset   Allergic rhinitis Father    Eczema Sister    Allergic rhinitis Sister    Colon cancer Neg Hx    Rectal cancer Neg Hx    Stomach cancer Neg Hx    Esophageal cancer Neg Hx      ROS: Denies fever, fatigue, unexplained weight loss/gain, hearing or vision changes, cardiac or respiratory complaints. Denies neurological deficits, musculoskeletal complaints, gastrointestinal or genitourinary complaints, mental health complaints, and skin changes.   Objective:   There were no vitals filed for this visit.  GENERAL APPEARANCE: Well-appearing, in NAD. Well nourished.  SKIN: Pink, warm and dry. Turgor normal. No rash, lesion, ulceration, or ecchymoses. Hair evenly distributed.  HEENT: HEAD: Normocephalic.  EYES: PERRLA. EOMI. Lids intact w/o defect. Sclera white, Conjunctiva pink w/o exudate.  EARS: External ear w/o redness, swelling, masses or lesions. EAC clear. TM's intact, translucent w/o bulging, appropriate landmarks visualized. Appropriate acuity to conversational tones.   NOSE: Septum midline w/o deformity. Nares patent, mucosa pink and non-inflamed w/o drainage. No sinus tenderness.  THROAT: Uvula midline. Oropharynx clear. Tonsils non-inflamed w/o exudate ***. Oral mucosa pink and moist.  NECK: Supple, Trachea midline. Full ROM w/o pain or tenderness. No lymphadenopathy. Thyroid non-tender w/o enlargement or palpable masses.  BREASTS: Breasts pendulous, symmetrical, and w/o palpable masses. Nipples everted and w/o discharge. No rash or skin retraction. No axillary or supraclavicular lymphadenopathy.  RESPIRATORY: Chest wall symmetrical w/o masses. Respirations even and non-labored. Breath sounds clear to auscultation bilaterally. No wheezes, rales, rhonchi, or crackles. CARDIAC: S1, S2 present, regular rate and rhythm. No gallops, murmurs, rubs, or clicks. No carotid bruits. Capillary refill <2 seconds. Peripheral pulses 2+ bilaterally. GI: Abdomen soft w/o distention. Normoactive bowel sounds. No palpable masses or tenderness. No guarding or rebound tenderness. Liver and spleen w/o tenderness or enlargement. No CVA tenderness.  GU: *** External genitalia without erythema, lesions, or masses. No lymphadenopathy. Vaginal mucosa pink and moist without exudate, lesions, or ulcerations. Cervix pink without discharge. Cervical os closed. Uterus and adnexae palpable, not enlarged, and w/o tenderness. No palpable masses.  MSK: Muscle tone and strength appropriate for age, w/o atrophy or abnormal movement.  EXTREMITIES: Active ROM intact, w/o tenderness, crepitus, or contracture. No obvious joint deformities or effusions. No clubbing, edema, or cyanosis.  NEUROLOGIC: CN's II-XII intact. Motor strength symmetrical with no obvious weakness. No sensory deficits. DTR's 2+ symmetric bilaterally. Steady, even gait.  PSYCH/MENTAL STATUS: Alert, oriented x 3. Cooperative, appropriate mood and affect.   Chaperoned by Mary Sella CMA ***   Assessment & Plan:  There are no  diagnoses linked to this encounter.  No orders of the defined types were placed in this encounter.   PATIENT COUNSELING:  - Encouraged a healthy well-balanced diet. Patient may adjust caloric intake to maintain or achieve ideal body weight. May reduce intake of dietary saturated fat and total fat and have adequate dietary potassium and calcium preferably from fresh fruits, vegetables, and low-fat dairy products.   - Advised to avoid cigarette smoking. - Discussed with the patient that most people either abstain from alcohol or drink within safe limits (<=14/week and <=4 drinks/occasion for males, <=7/weeks and <= 3 drinks/occasion for females) and that the risk for alcohol disorders and other health effects rises proportionally with the number of drinks per week and how often a drinker exceeds daily limits. - Discussed cessation/primary prevention of drug use and availability of treatment for abuse.  - Discussed sexually transmitted diseases, avoidance of unintended pregnancy and contraceptive alternatives.  - Stressed the importance of regular exercise - Injury prevention: Discussed safety belts, safety helmets, smoke detector, smoking near bedding or upholstery.  - Dental health: Discussed importance of regular tooth brushing, flossing, and dental visits.   NEXT PREVENTATIVE PHYSICAL DUE IN 1 YEAR.  No follow-ups on file.  Salvatore Decent, FNP

## 2023-08-12 NOTE — Telephone Encounter (Signed)
 04/21/2023 no show 06/23/2023 same day cancel 07/26/2023 no show  I did not catch 2/13 missed visit. Final warning sent via mail and mychart. Pt has not rescheduled.

## 2023-08-15 NOTE — Telephone Encounter (Signed)
 Provider aware

## 2023-08-22 ENCOUNTER — Other Ambulatory Visit: Payer: Self-pay

## 2023-08-22 ENCOUNTER — Ambulatory Visit (INDEPENDENT_AMBULATORY_CARE_PROVIDER_SITE_OTHER): Payer: MEDICAID | Admitting: Internal Medicine

## 2023-08-22 ENCOUNTER — Other Ambulatory Visit (HOSPITAL_COMMUNITY): Payer: Self-pay

## 2023-08-22 ENCOUNTER — Encounter: Payer: Self-pay | Admitting: Internal Medicine

## 2023-08-22 VITALS — BP 124/82 | HR 65 | Temp 97.9°F | Ht 64.0 in | Wt 171.4 lb

## 2023-08-22 DIAGNOSIS — N926 Irregular menstruation, unspecified: Secondary | ICD-10-CM

## 2023-08-22 DIAGNOSIS — Z23 Encounter for immunization: Secondary | ICD-10-CM | POA: Diagnosis not present

## 2023-08-22 DIAGNOSIS — Z3009 Encounter for other general counseling and advice on contraception: Secondary | ICD-10-CM | POA: Diagnosis not present

## 2023-08-22 DIAGNOSIS — J4520 Mild intermittent asthma, uncomplicated: Secondary | ICD-10-CM

## 2023-08-22 DIAGNOSIS — Z0000 Encounter for general adult medical examination without abnormal findings: Secondary | ICD-10-CM

## 2023-08-22 DIAGNOSIS — R112 Nausea with vomiting, unspecified: Secondary | ICD-10-CM

## 2023-08-22 LAB — POCT PREGNANCY, URINE

## 2023-08-22 MED ORDER — BUDESONIDE-FORMOTEROL FUMARATE 80-4.5 MCG/ACT IN AERO
2.0000 | INHALATION_SPRAY | Freq: Two times a day (BID) | RESPIRATORY_TRACT | 12 refills | Status: DC
  Filled 2023-08-22: qty 10.2, 30d supply, fill #0

## 2023-08-22 MED ORDER — ACETAMINOPHEN 500 MG PO TABS
500.0000 mg | ORAL_TABLET | Freq: Four times a day (QID) | ORAL | 0 refills | Status: DC | PRN
  Filled 2023-08-22: qty 30, 8d supply, fill #0

## 2023-08-22 MED ORDER — EPINEPHRINE 0.3 MG/0.3ML IJ SOAJ
INTRAMUSCULAR | 0 refills | Status: DC
  Filled 2023-08-22: qty 2, 30d supply, fill #0

## 2023-08-22 MED ORDER — ALBUTEROL SULFATE HFA 108 (90 BASE) MCG/ACT IN AERS
2.0000 | INHALATION_SPRAY | Freq: Four times a day (QID) | RESPIRATORY_TRACT | 2 refills | Status: DC | PRN
  Filled 2023-08-22: qty 18, 25d supply, fill #0

## 2023-08-22 MED ORDER — IBUPROFEN 400 MG PO TABS
400.0000 mg | ORAL_TABLET | Freq: Three times a day (TID) | ORAL | 0 refills | Status: DC | PRN
  Filled 2023-08-22: qty 30, 10d supply, fill #0

## 2023-08-22 MED ORDER — ONDANSETRON 4 MG PO TBDP
4.0000 mg | ORAL_TABLET | Freq: Three times a day (TID) | ORAL | 0 refills | Status: DC | PRN
  Filled 2023-08-22: qty 12, 4d supply, fill #0

## 2023-08-22 NOTE — Progress Notes (Signed)
 Subjective:   Amy Wall Sep 19, 2005  08/22/2023   CC: Chief Complaint  Patient presents with   Annual Exam    Fasting     HPI: Amy Wall is a 18 y.o. female who presents for a routine health maintenance exam.  Labs collected at time of visit.   Patient would like to discuss the Depo Provera injections for birth control.  Her last injection was September 2024, but then missed her other appointments so further injections were not provided.  She is concerned about side effects along with her fertility to be able to have a child in the future.  She is unsure when her last period was.  She is sexually active, last sexual encounter was approximately 1 month ago.   HEALTH SCREENINGS: - Pap smear: not applicable - Mammogram (40+): Not applicable  - Colonoscopy (45+): Not applicable  - Bone Density (65+): Not applicable  - Lung CA screening with low-dose CT:  Not applicable Adults age 16-80 who are current cigarette smokers or quit within the last 15 years. Must have 20 pack year history.   Depression and Anxiety Screen done today and results listed below:     01/20/2023    2:21 PM 08/23/2022    3:40 AM 07/08/2022    3:37 PM 06/28/2022    3:34 PM 04/01/2020    4:32 PM  Depression screen PHQ 2/9  Decreased Interest 0  0 1 2  Down, Depressed, Hopeless 2  1 1  0  PHQ - 2 Score 2  1 2 2   Altered sleeping 3  0 0 2  Tired, decreased energy 2  1 3 1   Change in appetite 1  2 1 2   Feeling bad or failure about yourself  0  0 2 1  Trouble concentrating 0  0 0 2  Moving slowly or fidgety/restless 0  0 0 0  Suicidal thoughts 0  0 0 0  PHQ-9 Score 8  4 8 10   Difficult doing work/chores Not difficult at all  Not difficult at all       Information is confidential and restricted. Go to Review Flowsheets to unlock data.      08/22/2023    3:20 PM 01/20/2023    2:22 PM 07/08/2022    3:37 PM 06/28/2022    3:35 PM  GAD 7 : Generalized Anxiety Score  Nervous, Anxious, on Edge 2  2 1 1   Control/stop worrying 1 0 0 0  Worry too much - different things 2 0 0 0  Trouble relaxing 2 0 0 0  Restless 2 0 0 0  Easily annoyed or irritable 2 0 0 0  Afraid - awful might happen 0 0 0 0  Total GAD 7 Score 11 2 1 1   Anxiety Difficulty Not difficult at all Not difficult at all Not difficult at all     IMMUNIZATIONS: - Tdap: Tetanus vaccination status reviewed: last tetanus booster within 10 years. - HPV: Administered today   Past medical history, surgical history, medications, allergies, family history and social history reviewed with patient today and changes made to appropriate areas of the chart.   Past Medical History:  Diagnosis Date   Anxiety    Asthma    DMDD (disruptive mood dysregulation disorder) (HCC)    Epilepsy (HCC)    pseuodoseizures per chart   HTN (hypertension)    Major depressive disorder    Oppositional defiant disorder    PTSD (post-traumatic stress disorder)  Schizophrenia (HCC)    Seizures (HCC)     Past Surgical History:  Procedure Laterality Date   BACK SURGERY     CHOLECYSTECTOMY, LAPAROSCOPIC      Current Outpatient Medications on File Prior to Visit  Medication Sig   albuterol (VENTOLIN HFA) 108 (90 Base) MCG/ACT inhaler Inhale 2 puffs into the lungs every 6 (six) hours as needed for wheezing or shortness of breath.   ARIPiprazole (ABILIFY) 15 MG tablet Take 15 mg by mouth at bedtime.   Bismuth 262 MG CHEW Chew 1 tablet by mouth in the morning, at noon, in the evening, and at bedtime.   budesonide-formoterol (SYMBICORT) 80-4.5 MCG/ACT inhaler Inhale 2 puffs into the lungs 2 (two) times daily.   chlorhexidine (PERIDEX) 0.12 % solution Swish for 30 seconds then spit, use twice daily   EPINEPHrine 0.3 mg/0.3 mL IJ SOAJ injection Inject into  mid outer thigh 1 time for 1 dose.   hydrOXYzine (ATARAX) 25 MG tablet Take 1 tablet (25 mg total) by mouth 3 (three) times daily as needed for anxiety.   ibuprofen (ADVIL) 400 MG tablet Take 1  tablet (400 mg total) by mouth every 4 (four) hours  for pain   ibuprofen (ADVIL) 600 MG tablet Take 1 tablet (600 mg total) by mouth a night time as needed.   medroxyPROGESTERone (DEPO-PROVERA) 150 MG/ML injection Inject 1 mL (150 mg total) into the muscle every 3 (three) months.   ondansetron (ZOFRAN-ODT) 4 MG disintegrating tablet Take 1 tablet (4 mg total) by mouth every 8 (eight) hours as needed for nausea or vomiting.   pantoprazole (PROTONIX) 40 MG tablet Take 1 tablet (40 mg total) by mouth 2 (two) times daily before a meal.   potassium chloride (KLOR-CON) 10 MEQ tablet Take 1 tablet (10 mEq total) by mouth daily.   promethazine (PHENERGAN) 25 MG tablet Take 0.5 tablets (12.5 mg total) by mouth every 6 (six) hours as needed for nausea or vomiting.   sertraline (ZOLOFT) 100 MG tablet Take 1 tablet (100 mg total) by mouth daily.   topiramate (TOPAMAX) 25 MG tablet Take 1 tablet (25 mg total) by mouth 2 (two) times daily.   traZODone (DESYREL) 100 MG tablet Take 1 tablet (100 mg total) by mouth every night.   acetaminophen (TYLENOL) 500 MG tablet Take 2 tablets (1,000 mg total) by mouth every 6 (six) hours for 7 days   promethazine (PHENERGAN) 25 MG suppository Place 1 suppository (25 mg total) rectally every 6 (six) hours as needed for nausea or vomiting.   No current facility-administered medications on file prior to visit.    Allergies  Allergen Reactions   Chocolate Anaphylaxis, Itching, Swelling and Other (See Comments)    Throat closes   Peanut-Containing Drug Products Anaphylaxis, Hives and Itching   Pineapple Anaphylaxis and Hives   Shellfish Allergy Anaphylaxis and Hives   Strawberry Extract Anaphylaxis and Hives   Charentais Melon (French Melon) Swelling and Other (See Comments)    Numbness and swelling of throat and tongue, also     Social History   Socioeconomic History   Marital status: Single    Spouse name: Not on file   Number of children: 0   Years of  education: Not on file   Highest education level: Not on file  Occupational History   Not on file  Tobacco Use   Smoking status: Every Day    Types: Cigars    Passive exposure: Yes   Smokeless tobacco: Never  Tobacco comments:    black and mild, marijuana  Vaping Use   Vaping status: Every Day  Substance and Sexual Activity   Alcohol use: Not Currently    Alcohol/week: 4.0 standard drinks of alcohol    Types: 2 Glasses of wine, 2 Shots of liquor per week   Drug use: Not Currently    Types: Marijuana, Methamphetamines    Comment: daily   Sexual activity: Not Currently    Birth control/protection: Condom, Injection  Other Topics Concern   Not on file  Social History Narrative   She lives with her uncle and aunt.   She has six siblings.   Social Drivers of Health   Financial Resource Strain: Medium Risk (06/28/2022)   Overall Financial Resource Strain (CARDIA)    Difficulty of Paying Living Expenses: Somewhat hard  Food Insecurity: No Food Insecurity (09/23/2022)   Hunger Vital Sign    Worried About Running Out of Food in the Last Year: Never true    Ran Out of Food in the Last Year: Never true  Transportation Needs: No Transportation Needs (09/23/2022)   PRAPARE - Administrator, Civil Service (Medical): No    Lack of Transportation (Non-Medical): No  Physical Activity: Inactive (06/28/2022)   Exercise Vital Sign    Days of Exercise per Week: 0 days    Minutes of Exercise per Session: 0 min  Stress: No Stress Concern Present (06/28/2022)   [employer] of Occupational Health - Occupational Stress Questionnaire    Feeling of Stress : Only a little  Social Connections: Moderately Isolated (06/28/2022)   Social Connection and Isolation Panel [NHANES]    Frequency of Communication with Friends and Family: Once a week    Frequency of Social Gatherings with Friends and Family: Once a week    Attends Religious Services: More than 4 times per year    Active  Member of [employer] or Organizations: Yes    Attends Banker Meetings: 1 to 4 times per year    Marital Status: Never married  Intimate Partner Violence: Not At Risk (09/23/2022)   Humiliation, Afraid, Rape, and Kick questionnaire    Fear of Current or Ex-Partner: No    Emotionally Abused: No    Physically Abused: No    Sexually Abused: No   Social History   Tobacco Use  Smoking Status Every Day   Types: Cigars   Passive exposure: Yes  Smokeless Tobacco Never  Tobacco Comments   black and mild, marijuana   Social History   Substance and Sexual Activity  Alcohol Use Not Currently   Alcohol/week: 4.0 standard drinks of alcohol   Types: 2 Glasses of wine, 2 Shots of liquor per week    Family History  Problem Relation Age of Onset   Allergic rhinitis Father    Eczema Sister    Allergic rhinitis Sister    Colon cancer Neg Hx    Rectal cancer Neg Hx    Stomach cancer Neg Hx    Esophageal cancer Neg Hx      ROS: Denies fever, fatigue, unexplained weight loss/gain, hearing or vision changes, cardiac or respiratory complaints. Denies neurological deficits, musculoskeletal complaints, gastrointestinal or genitourinary complaints, mental health complaints, and skin changes.   Objective:   Today's Vitals   08/22/23 1513  BP: 124/82  Pulse: 65  Temp: 97.9 F (36.6 C)  TempSrc: Temporal  SpO2: 98%  Weight: 171 lb 6.4 oz (77.7 kg)  Height: 5\' 4"  (1.626 m)  GENERAL APPEARANCE: Well-appearing, in NAD. Well nourished.  SKIN: Pink, warm and dry. Turgor normal. No rash, lesion, ulceration, or ecchymoses. Hair evenly distributed.  Linear superficial abrasions with scabbing present to left forearm from patient cutting.  (She does have a counseling team she meets with for this) HEENT: HEAD: Normocephalic.  EYES: PERRLA. EOMI. Lids intact w/o defect. Sclera white, Conjunctiva pink w/o exudate.  EARS: External ear w/o redness, swelling, masses or lesions. EAC clear. TM's  intact, translucent w/o bulging, appropriate landmarks visualized. Appropriate acuity to conversational tones.  NOSE: Septum midline w/o deformity. Nares patent, mucosa pink and non-inflamed w/o drainage. No sinus tenderness.  THROAT: Uvula midline. Oropharynx clear. Tonsils non-inflamed w/o exudate . Oral mucosa pink and moist.  NECK: Supple, Trachea midline. Full ROM w/o pain or tenderness. No lymphadenopathy. Thyroid non-tender w/o enlargement or palpable masses.  BREASTS: Breasts pendulous, symmetrical, and w/o palpable masses. Nipples everted and w/o discharge. No rash or skin retraction. No axillary or supraclavicular lymphadenopathy.  RESPIRATORY: Chest wall symmetrical w/o masses. Respirations even and non-labored. Breath sounds clear to auscultation bilaterally. No wheezes, rales, rhonchi, or crackles. CARDIAC: S1, S2 present, regular rate and rhythm. No gallops, murmurs, rubs, or clicks. Capillary refill <2 seconds. Peripheral pulses 2+ bilaterally. GI: Abdomen soft w/o distention. Normoactive bowel sounds. No palpable masses or tenderness. No guarding or rebound tenderness. Liver and spleen w/o tenderness or enlargement. No CVA tenderness.  MSK: Muscle tone and strength appropriate for age, w/o atrophy or abnormal movement.  EXTREMITIES: Active ROM intact, w/o tenderness, crepitus, or contracture. No obvious joint deformities or effusions. No clubbing, edema, or cyanosis.  NEUROLOGIC: CN's II-XII intact. Motor strength symmetrical with no obvious weakness. No sensory deficits. Steady, even gait.  PSYCH/MENTAL STATUS: Alert, oriented x 3. Cooperative, appropriate mood and affect.   Results for orders placed or performed in visit on 08/22/23  POCT Pregnancy, Urine   Collection Time: 08/22/23  4:05 PM  Result Value Ref Range   Negative       Assessment & Plan:  Annual physical exam -     CBC with Differential/Platelet; Future -     Comprehensive metabolic panel with GFR; Future -      Lipid panel; Future -     TSH; Future  Missed period -     POCT Pregnancy, Urine -     hCG, quantitative, pregnancy; Future -   Patient requested urine and blood pregnancy test  Immunization due -     HPV 9-valent vaccine,Recombinat  Birth control counseling -   I discussed the most common side effects of Depo-Provera injections, but that this does not interfere with her future fertility.  It would currently prevent her from becoming pregnant at this time.  Patient declines to get Depo injection today.  She declines all other forms of birth control.   Orders Placed This Encounter  Procedures   HPV 9-valent vaccine,Recombinat   CBC with Differential/Platelet    Standing Status:   Future    Expiration Date:   08/21/2024   Comprehensive metabolic panel with GFR    Standing Status:   Future    Expiration Date:   08/21/2024   hCG, quantitative, pregnancy    Standing Status:   Future    Expiration Date:   08/21/2024   Lipid panel    Standing Status:   Future    Expiration Date:   08/21/2024   TSH    Standing Status:   Future    Expiration Date:  08/21/2024    PATIENT COUNSELING:  - Encouraged a healthy well-balanced diet. Patient may adjust caloric intake to maintain or achieve ideal body weight. May reduce intake of dietary saturated fat and total fat and have adequate dietary potassium and calcium preferably from fresh fruits, vegetables, and low-fat dairy products.   - Advised to avoid cigarette smoking. - Discussed with the patient that most people either abstain from alcohol or drink within safe limits (<=14/week and <=4 drinks/occasion for males, <=7/weeks and <= 3 drinks/occasion for females) and that the risk for alcohol disorders and other health effects rises proportionally with the number of drinks per week and how often a drinker exceeds daily limits. - Discussed cessation/primary prevention of drug use and availability of treatment for abuse.  - Discussed sexually  transmitted diseases, avoidance of unintended pregnancy and contraceptive alternatives.  - Stressed the importance of regular exercise - Injury prevention: Discussed safety belts, safety helmets, smoke detector, smoking near bedding or upholstery.  - Dental health: Discussed importance of regular tooth brushing, flossing, and dental visits.   NEXT PREVENTATIVE PHYSICAL DUE IN 1 YEAR.  Return in about 2 months (around 10/22/2023) for 2nd HPV vaccine. AND 6 months follow up with PCP .  Gavin Kast, FNP

## 2023-08-30 ENCOUNTER — Other Ambulatory Visit: Payer: Self-pay

## 2023-08-30 ENCOUNTER — Encounter (HOSPITAL_COMMUNITY): Payer: Self-pay

## 2023-08-30 ENCOUNTER — Emergency Department (HOSPITAL_COMMUNITY)
Admission: EM | Admit: 2023-08-30 | Discharge: 2023-08-31 | Disposition: A | Discharge status: Home / Self Care | Payer: MEDICAID | Attending: Emergency Medicine | Admitting: Emergency Medicine

## 2023-08-30 ENCOUNTER — Other Ambulatory Visit (INDEPENDENT_AMBULATORY_CARE_PROVIDER_SITE_OTHER): Payer: MEDICAID

## 2023-08-30 DIAGNOSIS — Z0000 Encounter for general adult medical examination without abnormal findings: Secondary | ICD-10-CM

## 2023-08-30 DIAGNOSIS — T7840XA Allergy, unspecified, initial encounter: Secondary | ICD-10-CM | POA: Diagnosis present

## 2023-08-30 DIAGNOSIS — Z91010 Allergy to peanuts: Secondary | ICD-10-CM | POA: Insufficient documentation

## 2023-08-30 DIAGNOSIS — I10 Essential (primary) hypertension: Secondary | ICD-10-CM | POA: Insufficient documentation

## 2023-08-30 DIAGNOSIS — N926 Irregular menstruation, unspecified: Secondary | ICD-10-CM | POA: Diagnosis not present

## 2023-08-30 DIAGNOSIS — G40909 Epilepsy, unspecified, not intractable, without status epilepticus: Secondary | ICD-10-CM | POA: Diagnosis not present

## 2023-08-30 DIAGNOSIS — R569 Unspecified convulsions: Secondary | ICD-10-CM

## 2023-08-30 DIAGNOSIS — R519 Headache, unspecified: Secondary | ICD-10-CM

## 2023-08-30 DIAGNOSIS — J4520 Mild intermittent asthma, uncomplicated: Secondary | ICD-10-CM | POA: Diagnosis not present

## 2023-08-30 LAB — LIPID PANEL
Cholesterol: 167 mg/dL (ref 0–200)
HDL: 34.6 mg/dL — ABNORMAL LOW (ref >39.00)
LDL Cholesterol: 122 mg/dL — ABNORMAL HIGH (ref 0–99)
NonHDL: 132.16
Total CHOL/HDL Ratio: 5
Triglycerides: 52 mg/dL (ref 0.0–149.0)
VLDL: 10.4 mg/dL (ref 0.0–40.0)

## 2023-08-30 LAB — CBC WITH DIFFERENTIAL/PLATELET
Basophils Absolute: 0 10*3/uL (ref 0.0–0.1)
Basophils Relative: 0.4 % (ref 0.0–3.0)
Eosinophils Absolute: 0 10*3/uL (ref 0.0–0.7)
Eosinophils Relative: 0.4 % (ref 0.0–5.0)
HCT: 33.8 % — ABNORMAL LOW (ref 36.0–49.0)
Hemoglobin: 11.2 g/dL — ABNORMAL LOW (ref 12.0–16.0)
Lymphocytes Relative: 53.7 % — ABNORMAL HIGH (ref 24.0–48.0)
Lymphs Abs: 3.6 10*3/uL (ref 0.7–4.0)
MCHC: 33.2 g/dL (ref 31.0–37.0)
MCV: 91 fl (ref 78.0–98.0)
Monocytes Absolute: 0.6 10*3/uL (ref 0.1–1.0)
Monocytes Relative: 8.2 % (ref 3.0–12.0)
Neutro Abs: 2.5 10*3/uL (ref 1.4–7.7)
Neutrophils Relative %: 37.3 % — ABNORMAL LOW (ref 43.0–71.0)
Platelets: 358 10*3/uL (ref 150.0–575.0)
RBC: 3.72 Mil/uL — ABNORMAL LOW (ref 3.80–5.70)
RDW: 13.7 % (ref 11.4–15.5)
WBC: 6.7 10*3/uL (ref 4.5–13.5)

## 2023-08-30 LAB — COMPREHENSIVE METABOLIC PANEL WITH GFR
ALT: 5 U/L (ref 0–35)
AST: 10 U/L (ref 0–37)
Albumin: 4 g/dL (ref 3.5–5.2)
Alkaline Phosphatase: 79 U/L (ref 47–119)
BUN: 8 mg/dL (ref 6–23)
CO2: 26 meq/L (ref 19–32)
Calcium: 9 mg/dL (ref 8.4–10.5)
Chloride: 105 meq/L (ref 96–112)
Creatinine, Ser: 0.54 mg/dL (ref 0.40–1.20)
GFR: 133.57 mL/min (ref >60.00)
Glucose, Bld: 88 mg/dL (ref 70–99)
Potassium: 3.7 meq/L (ref 3.5–5.1)
Sodium: 138 meq/L (ref 135–145)
Total Bilirubin: 0.4 mg/dL (ref 0.2–1.2)
Total Protein: 6.8 g/dL (ref 6.0–8.3)

## 2023-08-30 LAB — TSH: TSH: 1.35 u[IU]/mL (ref 0.40–5.00)

## 2023-08-30 LAB — HCG, QUANTITATIVE, PREGNANCY: Quantitative HCG: <0.6 m[IU]/mL

## 2023-08-30 MED ORDER — PROCHLORPERAZINE EDISYLATE 10 MG/2ML IJ SOLN
10.0000 mg | Freq: Once | INTRAMUSCULAR | Status: AC
  Administered 2023-08-31: 10 mg via INTRAVENOUS
  Filled 2023-08-30: qty 2

## 2023-08-30 MED ORDER — SODIUM CHLORIDE 0.9 % IV BOLUS
1000.0000 mL | Freq: Once | INTRAVENOUS | Status: AC
  Administered 2023-08-31: 1000 mL via INTRAVENOUS

## 2023-08-30 MED ORDER — FAMOTIDINE IN NACL 20-0.9 MG/50ML-% IV SOLN
20.0000 mg | Freq: Once | INTRAVENOUS | Status: AC
  Administered 2023-08-31: 20 mg via INTRAVENOUS
  Filled 2023-08-30: qty 50
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
## 2023-08-30 NOTE — ED Triage Notes (Signed)
 BIB EMS from home. Pt reports she ate shrimp at 1800, pt is allergic to shrimp. Pt stated that since it was a seafood boil she thought she would be okay. Pt reports breaking out in a rash, and then using her Epipen . Pts sister reports pt then started "shaking and foaming at the mouth", then she lowered to the floor. Pt has no hx of seizures, but reports pseudoseizures when she is upset. Hx of anxiety and behavioral. Pts only complaints are nausea and headache. 22 g established in L hand.

## 2023-08-31 ENCOUNTER — Other Ambulatory Visit (HOSPITAL_COMMUNITY): Payer: Self-pay

## 2023-08-31 LAB — COMPREHENSIVE METABOLIC PANEL WITH GFR
ALT: 9 U/L (ref 0–44)
AST: 14 U/L — ABNORMAL LOW (ref 15–41)
Albumin: 4 g/dL (ref 3.5–5.0)
Alkaline Phosphatase: 81 U/L (ref 38–126)
Anion gap: 7 (ref 5–15)
BUN: 10 mg/dL (ref 6–20)
CO2: 24 mmol/L (ref 22–32)
Calcium: 9 mg/dL (ref 8.9–10.3)
Chloride: 107 mmol/L (ref 98–111)
Creatinine, Ser: 0.52 mg/dL (ref 0.44–1.00)
GFR, Estimated: >60 mL/min (ref >60)
Glucose, Bld: 97 mg/dL (ref 70–99)
Potassium: 2.9 mmol/L — ABNORMAL LOW (ref 3.5–5.1)
Sodium: 138 mmol/L (ref 135–145)
Total Bilirubin: 0.6 mg/dL (ref 0.0–1.2)
Total Protein: 7.2 g/dL (ref 6.5–8.1)

## 2023-08-31 LAB — CBC WITH DIFFERENTIAL/PLATELET
Abs Immature Granulocytes: 0.02 10*3/uL (ref 0.00–0.07)
Basophils Absolute: 0 10*3/uL (ref 0.0–0.1)
Basophils Relative: 1 %
Eosinophils Absolute: 0 10*3/uL (ref 0.0–0.5)
Eosinophils Relative: 0 %
HCT: 32.3 % — ABNORMAL LOW (ref 36.0–46.0)
Hemoglobin: 11 g/dL — ABNORMAL LOW (ref 12.0–15.0)
Immature Granulocytes: 0 %
Lymphocytes Relative: 27 %
Lymphs Abs: 2.3 10*3/uL (ref 0.7–4.0)
MCH: 30.6 pg (ref 26.0–34.0)
MCHC: 34.1 g/dL (ref 30.0–36.0)
MCV: 89.7 fL (ref 80.0–100.0)
Monocytes Absolute: 0.7 10*3/uL (ref 0.1–1.0)
Monocytes Relative: 8 %
Neutro Abs: 5.3 10*3/uL (ref 1.7–7.7)
Neutrophils Relative %: 64 %
Platelets: 330 10*3/uL (ref 150–400)
RBC: 3.6 MIL/uL — ABNORMAL LOW (ref 3.87–5.11)
RDW: 12.4 % (ref 11.5–15.5)
WBC: 8.4 10*3/uL (ref 4.0–10.5)
nRBC: 0 % (ref 0.0–0.2)

## 2023-08-31 LAB — HCG, SERUM, QUALITATIVE: Preg, Serum: NEGATIVE

## 2023-08-31 MED ORDER — DIPHENHYDRAMINE HCL 25 MG PO TABS
25.0000 mg | ORAL_TABLET | Freq: Four times a day (QID) | ORAL | 0 refills | Status: DC
Start: 2023-08-31 — End: 2023-10-20
  Filled 2023-08-31: qty 20, 5d supply, fill #0

## 2023-08-31 MED ORDER — FAMOTIDINE 20 MG PO TABS
20.0000 mg | ORAL_TABLET | Freq: Every day | ORAL | 0 refills | Status: DC
  Filled 2023-08-31: qty 5, 5d supply, fill #0

## 2023-08-31 MED ORDER — PREDNISONE 20 MG PO TABS
40.0000 mg | ORAL_TABLET | Freq: Every day | ORAL | 0 refills | Status: AC
  Filled 2023-08-31: qty 8, 4d supply, fill #0

## 2023-08-31 NOTE — ED Notes (Signed)
 This RN unsuccessfully attempted again to contact pts Legal Guardian about getting the pt home. Legal Guardian did not have a voicemail box set up, so this RN was unable to leave a voicemail. Will continue to attempt to reach out.

## 2023-08-31 NOTE — ED Notes (Signed)
 Unsuccessful attempt at reaching pts Legal Guardian Amy Wall), in regards to her being discharged.

## 2023-08-31 NOTE — ED Notes (Signed)
 Call to A Hildy Lowers, legal guardian, @ mobile and home # listed,  no answer or vm

## 2023-08-31 NOTE — ED Notes (Addendum)
 Call to A Thompson,  both #'s still no answer or vm -charge notified

## 2023-08-31 NOTE — ED Notes (Signed)
 Unsuccessful attempt at reaching Legal Guardian in regards to pts discharge.

## 2023-08-31 NOTE — ED Notes (Signed)
 Attempted x3 to leave a message for legal guardian. Does not let me leave a message the phone cuts off.

## 2023-08-31 NOTE — ED Notes (Signed)
 Patient called her dad. She said he sent her money for uber to go back home.

## 2023-09-01 ENCOUNTER — Encounter: Payer: Self-pay | Admitting: Internal Medicine

## 2023-09-10 ENCOUNTER — Other Ambulatory Visit (HOSPITAL_COMMUNITY): Payer: Self-pay

## 2023-09-26 ENCOUNTER — Ambulatory Visit
Admission: EM | Admit: 2023-09-26 | Discharge: 2023-09-26 | Disposition: A | Discharge status: Home / Self Care | Payer: MEDICAID | Attending: Family Medicine | Admitting: Family Medicine

## 2023-09-26 DIAGNOSIS — J4541 Moderate persistent asthma with (acute) exacerbation: Secondary | ICD-10-CM

## 2023-09-26 DIAGNOSIS — J309 Allergic rhinitis, unspecified: Secondary | ICD-10-CM | POA: Diagnosis not present

## 2023-09-26 DIAGNOSIS — J018 Other acute sinusitis: Secondary | ICD-10-CM

## 2023-09-26 MED ORDER — PREDNISONE 5 MG/5ML PO SOLN: 30.0000 mg | Freq: Every day | ORAL | 0 refills | Status: AC

## 2023-09-26 MED ORDER — PROMETHAZINE-DM 6.25-15 MG/5ML PO SYRP: 5.0000 mL | ORAL_SOLUTION | Freq: Three times a day (TID) | ORAL | 0 refills | Status: DC | PRN

## 2023-09-26 MED ORDER — AMOXICILLIN-POT CLAVULANATE 400-57 MG/5ML PO SUSR: 800.0000 mg | Freq: Two times a day (BID) | ORAL | 0 refills | Status: AC

## 2023-09-26 NOTE — Discharge Instructions (Signed)
 I am managing you for a sinus infection with exacerbation of your allergies and asthma. Start Augmentin  for the infection and use prednisone  for the allergies, asthma, general respiratory symptoms. Use cough syrup as needed.

## 2023-09-26 NOTE — ED Provider Notes (Signed)
 [facility]  Note:  This document was prepared using Conservation officer, historic buildings and may include unintentional dictation errors.  MRN: [id number] DOB: 2005/08/01  Subjective:   Amy Wall is a 18 y.o. female presenting for 2-week history of persistent headache, sinus congestion, bilateral ear fullness, throat pain. Has had some chest pain, shob, coughing. Has been using her inhalers.  No confusion, neck pain, neck stiffness, ear pain, ear drainage, nausea, vomiting, abdominal pain, rashes, urinary symptoms, changes to bowel or urinary habits.  No current facility-administered medications for this encounter.  Current Outpatient Medications:    medroxyPROGESTERone  (DEPO-PROVERA ) 150 MG/ML injection, Inject 1 mL (150 mg total) into the muscle every 3 (three) months., Disp: 1 mL, Rfl: 4   acetaminophen  (TYLENOL ) 500 MG tablet, Take 1 tablet (500 mg total) by mouth every 6 (six) hours as needed for moderate pain (pain score 4-6)., Disp: 30 tablet, Rfl: 0   albuterol  (VENTOLIN  HFA) 108 (90 Base) MCG/ACT inhaler, Inhale 2 puffs into the lungs every 6 (six) hours as needed for wheezing or shortness of breath., Disp: 18 g, Rfl: 2   ARIPiprazole  (ABILIFY ) 15 MG tablet, Take 15 mg by mouth at bedtime., Disp: , Rfl:    Bismuth  262 MG CHEW, Chew 1 tablet by mouth in the morning, at noon, in the evening, and at bedtime., Disp: 112 tablet, Rfl: 0   budesonide -formoterol  (SYMBICORT ) 80-4.5 MCG/ACT inhaler, Inhale 2 puffs into the lungs 2 (two) times daily., Disp: 10.2 g, Rfl: 12   chlorhexidine  (PERIDEX ) 0.12 % solution, Swish for 30 seconds then spit, use twice daily, Disp: 473 mL, Rfl: 0   diphenhydrAMINE  (BENADRYL ) 25 MG tablet, Take 1 tablet (25 mg total) by mouth every 6 (six) hours for 5 days., Disp: 20 tablet, Rfl: 0   EPINEPHrine  0.3 mg/0.3 mL IJ SOAJ injection, Inject into  mid outer thigh 1 time for 1 dose., Disp: 2 each, Rfl: 0   famotidine  (PEPCID ) 20 MG  tablet, Take 1 tablet (20 mg total) by mouth daily for 5 days., Disp: 5 tablet, Rfl: 0   hydrOXYzine  (ATARAX ) 25 MG tablet, Take 1 tablet (25 mg total) by mouth 3 (three) times daily as needed for anxiety., Disp: 30 tablet, Rfl: 0   ibuprofen  (ADVIL ) 400 MG tablet, Take 1 tablet (400 mg total) by mouth every 8 (eight) hours as needed., Disp: 30 tablet, Rfl: 0   ibuprofen  (ADVIL ) 600 MG tablet, Take 1 tablet (600 mg total) by mouth a night time as needed., Disp: 16 tablet, Rfl: 1   ondansetron  (ZOFRAN -ODT) 4 MG disintegrating tablet, Dissolve 1 tablet (4 mg total) by mouth every 8 (eight) hours as needed for nausea or vomiting., Disp: 12 tablet, Rfl: 0   pantoprazole  (PROTONIX ) 40 MG tablet, Take 1 tablet (40 mg total) by mouth 2 (two) times daily before a meal., Disp: 28 tablet, Rfl: 0   potassium chloride  (KLOR-CON ) 10 MEQ tablet, Take 1 tablet (10 mEq total) by mouth daily., Disp: 4 tablet, Rfl: 0   promethazine  (PHENERGAN ) 25 MG suppository, Place 1 suppository (25 mg total) rectally every 6 (six) hours as needed for nausea or vomiting., Disp: 12 each, Rfl: 0   promethazine  (PHENERGAN ) 25 MG tablet, Take 0.5 tablets (12.5 mg total) by mouth every 6 (six) hours as needed for nausea or vomiting., Disp: 20 tablet, Rfl: 0   sertraline  (ZOLOFT ) 100 MG tablet, Take 1 tablet (100 mg total) by mouth daily., Disp: 30 tablet, Rfl: 0  topiramate  (TOPAMAX ) 25 MG tablet, Take 1 tablet (25 mg total) by mouth 2 (two) times daily., Disp: 60 tablet, Rfl: 0   traZODone  (DESYREL ) 100 MG tablet, Take 1 tablet (100 mg total) by mouth every night., Disp: 30 tablet, Rfl: 0   Allergies  Allergen Reactions   Chocolate Anaphylaxis, Itching, Swelling and Other (See Comments)    Throat closes   Peanut-Containing Drug Products Anaphylaxis, Hives and Itching   Pineapple Anaphylaxis and Hives   Shellfish Allergy Anaphylaxis and Hives   Strawberry Extract Anaphylaxis and Hives   Charentais Melon (French Melon) Swelling and  Other (See Comments)    Numbness and swelling of throat and tongue, also    Past Medical History:  Diagnosis Date   Anxiety    Asthma    DMDD (disruptive mood dysregulation disorder) (HCC)    Epilepsy (HCC)    pseuodoseizures per chart   HTN (hypertension)    Major depressive disorder    Oppositional defiant disorder    PTSD (post-traumatic stress disorder)    Schizophrenia (HCC)    Seizures (HCC)      Past Surgical History:  Procedure Laterality Date   BACK SURGERY     CHOLECYSTECTOMY, LAPAROSCOPIC      Family History  Problem Relation Age of Onset   Allergic rhinitis Father    Eczema Sister    Allergic rhinitis Sister    Colon cancer Neg Hx    Rectal cancer Neg Hx    Stomach cancer Neg Hx    Esophageal cancer Neg Hx     Social History   Tobacco Use   Smoking status: Every Day    Types: Cigars    Passive exposure: Yes   Smokeless tobacco: Never   Tobacco comments:    black and mild, marijuana  Vaping Use   Vaping status: Every Day  Substance Use Topics   Alcohol use: Not Currently    Alcohol/week: 4.0 standard drinks of alcohol    Types: 2 Glasses of wine, 2 Shots of liquor per week   Drug use: Not Currently    Types: Marijuana, Methamphetamines    Comment: daily    ROS   Objective:   Vitals: BP 116/78 (BP Location: Right Arm)   Pulse 85   Temp 98.6 F (37 C) (Oral)   Resp 16   LMP  (LMP Unknown)   SpO2 97%   Physical Exam Constitutional:      General: She is not in acute distress.    Appearance: Normal appearance. She is well-developed and normal weight. She is not ill-appearing, toxic-appearing or diaphoretic.  HENT:     Head: Normocephalic and atraumatic.     Right Ear: Tympanic membrane, ear canal and external ear normal. No drainage or tenderness. No middle ear effusion. There is no impacted cerumen. Tympanic membrane is not erythematous or bulging.     Left Ear: Tympanic membrane, ear canal and external ear normal. No drainage or  tenderness.  No middle ear effusion. There is no impacted cerumen. Tympanic membrane is not erythematous or bulging.     Nose: Congestion and rhinorrhea present.     Mouth/Throat:     Mouth: Mucous membranes are moist. No oral lesions.     Pharynx: No pharyngeal swelling, oropharyngeal exudate, posterior oropharyngeal erythema or uvula swelling.     Tonsils: No tonsillar exudate or tonsillar abscesses.     Comments: Thick streaks of postnasal drainage overlying pharynx.  Eyes:     General: No scleral icterus.  Right eye: No discharge.        Left eye: No discharge.     Extraocular Movements: Extraocular movements intact.     Right eye: Normal extraocular motion.     Left eye: Normal extraocular motion.     Conjunctiva/sclera: Conjunctivae normal.  Cardiovascular:     Rate and Rhythm: Normal rate and regular rhythm.     Heart sounds: Normal heart sounds. No murmur heard.    No friction rub. No gallop.  Pulmonary:     Effort: Pulmonary effort is normal. No respiratory distress.     Breath sounds: No stridor. No wheezing, rhonchi or rales.  Chest:     Chest wall: No tenderness.  Musculoskeletal:     Cervical back: Normal range of motion and neck supple.  Lymphadenopathy:     Cervical: No cervical adenopathy.  Skin:    General: Skin is warm and dry.  Neurological:     General: No focal deficit present.     Mental Status: She is alert and oriented to person, place, and time.  Psychiatric:        Mood and Affect: Mood normal.        Behavior: Behavior normal.     Assessment and Plan :   PDMP not reviewed this encounter.  1. Acute non-recurrent sinusitis of other sinus   2. Allergic rhinitis, unspecified seasonality, unspecified trigger   3. Moderate persistent asthma with (acute) exacerbation    Recommend managing for sinusitis and allergic rhinitis, asthma exacerbation with Augmentin , prednisone  and general supportive care.  Patient requested liquid formulation for  medications.  Does not need refills on her inhalers.  Will defer imaging for now.  Counseled patient on potential for adverse effects with medications prescribed/recommended today, ER and return-to-clinic precautions discussed, patient verbalized understanding.    Adolph Hoop, [place] 09/27/23 [phone]

## 2023-09-26 NOTE — ED Triage Notes (Signed)
 Patient presents with headache and sore throat x 2 weeks. Patient has been taking Pepto.

## 2023-10-12 ENCOUNTER — Ambulatory Visit: Payer: Self-pay

## 2023-10-12 NOTE — Telephone Encounter (Signed)
 FYI

## 2023-10-12 NOTE — Telephone Encounter (Signed)
    FYI Only or Action Required?: FYI only for provider  Patient was last seen in primary care on 08/22/2023 by Gavin Kast, FNP. Called Nurse Triage reporting Arm Swelling. Symptoms began yesterday. Interventions attempted: Nothing. Symptoms are: unchanged.  Triage Disposition: Go to ED or PCP/Alternative with Approval  Patient/caregiver understands and will follow disposition?: YesCopied from CRM [id number]. Topic: Clinical - Red Word Triage >> Oct 12, 2023 11:52 AM Lajean Pike wrote: Red Word that prompted transfer to Nurse Triage: Swelling in left arm crease, red, itches, heat, can barely close arm, losing feeling in arm and hand Reason for Disposition  [1] Red area or streak AND [2] fever  Answer Assessment - Initial Assessment Questions 1. ONSET: "When did the swelling start?" (e.g., minutes, hours, days)     yesterday 2. LOCATION: "What part of the arm is swollen?"  "Are both arms swollen or just one arm?"     Left arm bump, red and warm 3. SEVERITY: "How bad is the swelling?" (e.g., localized; mild, moderate, severe)   - LOCALIZED: Small area of puffiness or swelling on just one arm   - JOINT SWELLING: Swelling of one joint   - MILD: Puffiness or swelling of hand   - MODERATE: Puffiness or swollen feeling of entire arm    - SEVERE: All of arm looks swollen; pitting edema     severe 4. REDNESS: "Does the swelling look red or infected?"     Yes and warm to touch 5. PAIN: "Is the swelling painful to touch?" If Yes, ask: "How painful is it?"   (Scale 1-10; mild, moderate or severe)     9.5/10 6. FEVER: "Do you have a fever?" If Yes, ask: "What is it, how was it measured, and when did it start?"      States she sweats 7. CAUSE: "What do you think is causing the arm swelling?"     no 8. MEDICAL HISTORY: "Do you have a history of heart failure, kidney disease, liver failure, or cancer?"     no 9. RECURRENT SYMPTOM: "Have you had arm swelling before?" If Yes, ask: "When was the last  time?" "What happened that time?"     Monday 10. OTHER SYMPTOMS: "Do you have any other symptoms?" (e.g., chest pain, difficulty breathing)       no 11. PREGNANCY: "Is there any chance you are pregnant?" "When was your last menstrual period?"       na  Protocols used: Arm Swelling and Edema-A-AH

## 2023-10-20 ENCOUNTER — Encounter: Payer: Self-pay | Admitting: Internal Medicine

## 2023-10-20 ENCOUNTER — Ambulatory Visit: Payer: MEDICAID | Admitting: Internal Medicine

## 2023-10-20 VITALS — BP 120/80 | HR 80 | Temp 97.6°F | Ht 64.0 in | Wt 160.6 lb

## 2023-10-20 DIAGNOSIS — J4520 Mild intermittent asthma, uncomplicated: Secondary | ICD-10-CM | POA: Diagnosis not present

## 2023-10-20 DIAGNOSIS — Z8619 Personal history of other infectious and parasitic diseases: Secondary | ICD-10-CM | POA: Diagnosis not present

## 2023-10-20 DIAGNOSIS — R112 Nausea with vomiting, unspecified: Secondary | ICD-10-CM | POA: Diagnosis not present

## 2023-10-20 DIAGNOSIS — G8929 Other chronic pain: Secondary | ICD-10-CM

## 2023-10-20 DIAGNOSIS — R1010 Upper abdominal pain, unspecified: Secondary | ICD-10-CM

## 2023-10-20 MED ORDER — PANTOPRAZOLE SODIUM 20 MG PO TBEC: 20.0000 mg | DELAYED_RELEASE_TABLET | Freq: Two times a day (BID) | ORAL | 1 refills | Status: DC

## 2023-10-20 MED ORDER — ONDANSETRON HCL 4 MG PO TABS
4.0000 mg | ORAL_TABLET | Freq: Once | ORAL | Status: AC
  Administered 2023-10-20: 4 mg via ORAL

## 2023-10-20 MED ORDER — PROMETHAZINE HCL 25 MG RE SUPP: 25.0000 mg | Freq: Four times a day (QID) | RECTAL | 1 refills | Status: DC | PRN

## 2023-10-20 MED ORDER — BUDESONIDE-FORMOTEROL FUMARATE 80-4.5 MCG/ACT IN AERO: 2.0000 | INHALATION_SPRAY | Freq: Two times a day (BID) | RESPIRATORY_TRACT | 12 refills | Status: DC

## 2023-10-20 MED ORDER — ALBUTEROL SULFATE HFA 108 (90 BASE) MCG/ACT IN AERS
2.0000 | INHALATION_SPRAY | Freq: Four times a day (QID) | RESPIRATORY_TRACT | 2 refills | Status: DC | PRN
Start: 2023-10-20 — End: 2024-03-19

## 2023-10-20 NOTE — Patient Instructions (Signed)
 Follow up with Dr. Cherryl Corona at [facility] on [address]
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
## 2023-11-02 ENCOUNTER — Ambulatory Visit (HOSPITAL_COMMUNITY)
Admission: EM | Admit: 2023-11-02 | Discharge: 2023-11-03 | Disposition: A | Discharge status: Other Acute Inpatient Hospital | Payer: MEDICAID | Attending: Nurse Practitioner | Admitting: Nurse Practitioner

## 2023-11-02 DIAGNOSIS — I517 Cardiomegaly: Secondary | ICD-10-CM | POA: Diagnosis not present

## 2023-11-02 DIAGNOSIS — F332 Major depressive disorder, recurrent severe without psychotic features: Secondary | ICD-10-CM | POA: Insufficient documentation

## 2023-11-02 DIAGNOSIS — R45851 Suicidal ideations: Secondary | ICD-10-CM | POA: Diagnosis not present

## 2023-11-02 DIAGNOSIS — Z79899 Other long term (current) drug therapy: Secondary | ICD-10-CM | POA: Insufficient documentation

## 2023-11-02 DIAGNOSIS — Z638 Other specified problems related to primary support group: Secondary | ICD-10-CM | POA: Insufficient documentation

## 2023-11-02 DIAGNOSIS — F1220 Cannabis dependence, uncomplicated: Secondary | ICD-10-CM | POA: Insufficient documentation

## 2023-11-02 DIAGNOSIS — Z62810 Personal history of physical and sexual abuse in childhood: Secondary | ICD-10-CM | POA: Insufficient documentation

## 2023-11-02 DIAGNOSIS — Z9151 Personal history of suicidal behavior: Secondary | ICD-10-CM | POA: Insufficient documentation

## 2023-11-02 DIAGNOSIS — Z5989 Other problems related to housing and economic circumstances: Secondary | ICD-10-CM | POA: Insufficient documentation

## 2023-11-02 DIAGNOSIS — Z62820 Parent-biological child conflict: Secondary | ICD-10-CM | POA: Insufficient documentation

## 2023-11-02 DIAGNOSIS — R9431 Abnormal electrocardiogram [ECG] [EKG]: Secondary | ICD-10-CM | POA: Insufficient documentation

## 2023-11-02 LAB — URINALYSIS, ROUTINE W REFLEX MICROSCOPIC
Bacteria, UA: NONE SEEN
Bilirubin Urine: NEGATIVE
Glucose, UA: NEGATIVE mg/dL
Hgb urine dipstick: NEGATIVE
Ketones, ur: NEGATIVE mg/dL
Nitrite: NEGATIVE
Protein, ur: NEGATIVE mg/dL
Specific Gravity, Urine: 1.017 (ref 1.005–1.030)
pH: 8 (ref 5.0–8.0)

## 2023-11-02 LAB — POCT URINE DRUG SCREEN - MANUAL ENTRY (I-SCREEN)
POC Amphetamine UR: NOT DETECTED
POC Buprenorphine (BUP): NOT DETECTED
POC Cocaine UR: NOT DETECTED
POC Marijuana UR: POSITIVE — AB
POC Methadone UR: NOT DETECTED
POC Methamphetamine UR: NOT DETECTED
POC Morphine: NOT DETECTED
POC Oxazepam (BZO): NOT DETECTED
POC Oxycodone UR: NOT DETECTED
POC Secobarbital (BAR): NOT DETECTED

## 2023-11-02 LAB — COMPREHENSIVE METABOLIC PANEL WITH GFR
ALT: 10 U/L (ref 0–44)
AST: 16 U/L (ref 15–41)
Albumin: 3.7 g/dL (ref 3.5–5.0)
Alkaline Phosphatase: 83 U/L (ref 38–126)
Anion gap: 11 (ref 5–15)
BUN: 9 mg/dL (ref 6–20)
CO2: 25 mmol/L (ref 22–32)
Calcium: 9.3 mg/dL (ref 8.9–10.3)
Chloride: 101 mmol/L (ref 98–111)
Creatinine, Ser: 0.55 mg/dL (ref 0.44–1.00)
GFR, Estimated: >60 mL/min (ref >60)
Glucose, Bld: 71 mg/dL (ref 70–99)
Potassium: 3.6 mmol/L (ref 3.5–5.1)
Sodium: 137 mmol/L (ref 135–145)
Total Bilirubin: 0.4 mg/dL (ref 0.0–1.2)
Total Protein: 7.1 g/dL (ref 6.5–8.1)

## 2023-11-02 LAB — CBC WITH DIFFERENTIAL/PLATELET
Abs Immature Granulocytes: 0.02 10*3/uL (ref 0.00–0.07)
Basophils Absolute: 0 10*3/uL (ref 0.0–0.1)
Basophils Relative: 0 %
Eosinophils Absolute: 0 10*3/uL (ref 0.0–0.5)
Eosinophils Relative: 0 %
HCT: 35.3 % — ABNORMAL LOW (ref 36.0–46.0)
Hemoglobin: 11.8 g/dL — ABNORMAL LOW (ref 12.0–15.0)
Immature Granulocytes: 0 %
Lymphocytes Relative: 34 %
Lymphs Abs: 2.9 10*3/uL (ref 0.7–4.0)
MCH: 30.1 pg (ref 26.0–34.0)
MCHC: 33.4 g/dL (ref 30.0–36.0)
MCV: 90.1 fL (ref 80.0–100.0)
Monocytes Absolute: 0.5 10*3/uL (ref 0.1–1.0)
Monocytes Relative: 6 %
Neutro Abs: 5.2 10*3/uL (ref 1.7–7.7)
Neutrophils Relative %: 60 %
Platelets: 427 10*3/uL — ABNORMAL HIGH (ref 150–400)
RBC: 3.92 MIL/uL (ref 3.87–5.11)
RDW: 12 % (ref 11.5–15.5)
WBC: 8.7 10*3/uL (ref 4.0–10.5)
nRBC: 0 % (ref 0.0–0.2)

## 2023-11-02 LAB — LIPID PANEL
Cholesterol: 169 mg/dL (ref 0–200)
HDL: 42 mg/dL (ref >40)
LDL Cholesterol: 113 mg/dL — ABNORMAL HIGH (ref 0–99)
Total CHOL/HDL Ratio: 4 ratio
Triglycerides: 70 mg/dL (ref <150)
VLDL: 14 mg/dL (ref 0–40)

## 2023-11-02 LAB — TSH: TSH: 0.764 u[IU]/mL (ref 0.350–4.500)

## 2023-11-02 LAB — HEMOGLOBIN A1C
Hgb A1c MFr Bld: 4.8 % (ref 4.8–5.6)
Mean Plasma Glucose: 91.06 mg/dL

## 2023-11-02 LAB — ETHANOL: Alcohol, Ethyl (B): <15 mg/dL (ref <15)

## 2023-11-02 LAB — POC URINE PREG, ED: Preg Test, Ur: NEGATIVE

## 2023-11-02 MED ORDER — MAGNESIUM HYDROXIDE 400 MG/5ML PO SUSP: 30.0000 mL | Freq: Every day | ORAL | Status: DC | PRN

## 2023-11-02 MED ORDER — ALBUTEROL SULFATE HFA 108 (90 BASE) MCG/ACT IN AERS
2.0000 | INHALATION_SPRAY | RESPIRATORY_TRACT | Status: DC | PRN
  Filled 2023-11-02: qty 6.7

## 2023-11-02 MED ORDER — LORAZEPAM 2 MG/ML IJ SOLN
2.0000 mg | Freq: Three times a day (TID) | INTRAMUSCULAR | Status: DC | PRN
  Administered 2023-11-02: 2 mg via INTRAMUSCULAR
  Filled 2023-11-02: qty 1

## 2023-11-02 MED ORDER — DIPHENHYDRAMINE HCL 50 MG/ML IJ SOLN: 50.0000 mg | Freq: Three times a day (TID) | INTRAMUSCULAR | Status: DC | PRN

## 2023-11-02 MED ORDER — ALUM & MAG HYDROXIDE-SIMETH 200-200-20 MG/5ML PO SUSP: 30.0000 mL | ORAL | Status: DC | PRN

## 2023-11-02 MED ORDER — HALOPERIDOL LACTATE 5 MG/ML IJ SOLN
10.0000 mg | Freq: Three times a day (TID) | INTRAMUSCULAR | Status: DC | PRN
  Administered 2023-11-02: 10 mg via INTRAMUSCULAR
  Filled 2023-11-02: qty 2

## 2023-11-02 MED ORDER — DIPHENHYDRAMINE HCL 50 MG PO CAPS
50.0000 mg | ORAL_CAPSULE | Freq: Three times a day (TID) | ORAL | Status: DC | PRN
  Filled 2023-11-02: qty 1

## 2023-11-02 MED ORDER — EPINEPHRINE 0.3 MG/0.3ML IJ SOAJ: 0.3000 mg | Freq: Two times a day (BID) | INTRAMUSCULAR | Status: DC | PRN

## 2023-11-02 MED ORDER — HYDROXYZINE HCL 25 MG PO TABS
25.0000 mg | ORAL_TABLET | Freq: Three times a day (TID) | ORAL | Status: DC | PRN
  Administered 2023-11-02: 25 mg via ORAL
  Filled 2023-11-02: qty 1

## 2023-11-02 MED ORDER — FLUTICASONE PROPIONATE HFA 44 MCG/ACT IN AERO
2.0000 | INHALATION_SPRAY | Freq: Two times a day (BID) | RESPIRATORY_TRACT | Status: DC
  Administered 2023-11-02 – 2023-11-03 (×2): 2 via RESPIRATORY_TRACT
  Filled 2023-11-02: qty 10.6

## 2023-11-02 MED ORDER — DOCUSATE SODIUM 100 MG PO CAPS
100.0000 mg | ORAL_CAPSULE | Freq: Two times a day (BID) | ORAL | Status: DC
  Administered 2023-11-02 – 2023-11-03 (×2): 100 mg via ORAL
  Filled 2023-11-02 (×2): qty 1

## 2023-11-02 MED ORDER — DIPHENHYDRAMINE HCL 50 MG/ML IJ SOLN
50.0000 mg | Freq: Three times a day (TID) | INTRAMUSCULAR | Status: DC | PRN
  Administered 2023-11-02: 50 mg via INTRAMUSCULAR
  Filled 2023-11-02: qty 1

## 2023-11-02 MED ORDER — HALOPERIDOL LACTATE 5 MG/ML IJ SOLN: 5.0000 mg | Freq: Three times a day (TID) | INTRAMUSCULAR | Status: DC | PRN

## 2023-11-02 MED ORDER — ACETAMINOPHEN 325 MG PO TABS
650.0000 mg | ORAL_TABLET | Freq: Four times a day (QID) | ORAL | Status: DC | PRN
  Administered 2023-11-02: 650 mg via ORAL
  Filled 2023-11-02: qty 2

## 2023-11-02 MED ORDER — LORAZEPAM 2 MG/ML IJ SOLN: 2.0000 mg | Freq: Three times a day (TID) | INTRAMUSCULAR | Status: DC | PRN

## 2023-11-02 MED ORDER — TRAZODONE HCL 50 MG PO TABS
50.0000 mg | ORAL_TABLET | Freq: Every evening | ORAL | Status: DC | PRN
  Administered 2023-11-02: 50 mg via ORAL
  Filled 2023-11-02: qty 1

## 2023-11-02 MED ORDER — HALOPERIDOL 5 MG PO TABS
5.0000 mg | ORAL_TABLET | Freq: Three times a day (TID) | ORAL | Status: DC | PRN
  Administered 2023-11-03: 5 mg via ORAL
  Filled 2023-11-02: qty 1

## 2023-11-02 MED ORDER — LORAZEPAM 2 MG/ML IJ SOLN: 1.0000 mg | Freq: Two times a day (BID) | INTRAMUSCULAR | Status: DC | PRN

## 2023-11-02 NOTE — ED Notes (Signed)
 Patient observed/assessed in bed/chair resting quietly appearing in no distress and verbalizing no complaints at this time. Will continue to monitor.

## 2023-11-02 NOTE — ED Notes (Signed)
 Patient is alert & oriented X 4. She endorses SI with no plan, depression, and anxiety. Denies HI or AVH. Patient contracts for safety. Skin check conducted by this nurse and Rufus, MHT. Patient oriented to the unit and provided and provided food and beverage. She denies physical pain or discomfort, or any other needs at this time.

## 2023-11-02 NOTE — ED Notes (Addendum)
 Patient had been requesting to leave the facility AMA. Patient was spoken to by NP Gaither Pouch that explained to patient that she would be required to stay the night and be re-evaluated again in the morning. Patient verbalized her frustration with the situation. After NP departed the area, patient's behaviors escalated rapidly and violently with verbal outbursts pertaining to her frustration with having to stay the night as well as expressing that she was offended by the way that she felt she was talked to and judged for my past during her conversation with the NP. She commenced to be walking around the unit, throwing items, shoving around chairs, attacking the wall and phone, and doing all of this while screaming. Patient was verbally de-escalated enough to have patient voluntarily receive PRN Haldol  10 mg, Benadryl  50 mg and ativan  2 mg IM for her behaviors. Patient continued to verbalize her frustration and remained tearful to the scenario post medication administration. Continuing to monitor.
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
## 2023-11-02 NOTE — Progress Notes (Addendum)
 Pt has been accepted to [facility] 11/02/2023 Bed assignment: [facility] [phone] Ozell PARAS Claudene Hammersmith)  Pt meets inpatient criteria per Donia Snell NP   Attending Physician will be Millie Manners, MD  Report can be called to: [phone]   Pt can arrive after 8 AM  Care Team Notified:  Donia Snell NP, Zane Sink, RN

## 2023-11-02 NOTE — ED Provider Notes (Signed)
 Writer was defined by nursing staff that patient was requesting to leave.  Writer did went to see the patient face-to-face spoke with patient.  Writer discussed with patient that because of her presentation and her past history of suicide attempts is recommended that she stays overnight for observation and then reassessment in the a.m.  I review of patient records shows that patient has been admitted for multiple suicide attempts in the past including overdose and that the last recent suicide attempt was a year ago where she is trying to cut her arm.  A review of provider's notes show that when patient came in she came in because she was having thoughts of not wanting to live and she also mentioned that this has been happening for 2 to 3 weeks and that she was having difficulty coping.  She also stated to the admitting provider that if she did not get help she might end up ending her life.  Even though she does not have a current plan.

## 2023-11-02 NOTE — ED Notes (Signed)
 Patient has laid down in bed and is appearing to rest at this time. Verbalizes no complaints. Continuing to monitor.
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
## 2023-11-02 NOTE — ED Provider Notes (Cosign Needed Addendum)
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   promethazine  (PHENERGAN ) 25 MG suppository Place 1 suppository (25 mg total) rectally every 6 (six) hours as needed for nausea or vomiting.    Medical Decision Making  Recommendation: -Patient is recommended for inpatient behavioral health hospitalization.  -Accepted to [facility] with an admission date for 12/03/2023 as per [facility] -Ordered baseline Labs -Trazodone  50 mg nightly prn for sleep -Hydroxyzine  25 mg tid prn for anxiety -Agitation protocol-see MAR -Epipen  for allergies PRN -Protonix  40 mg QAM for H pylori -Albuterol  PRN -Symbicort  inhaler daily   Recommendations  Based on my evaluation the patient appears to have an  emergency mental health condition for which I recommend the patient be transferred to an inpatient behavioral health unit for treatment and stabilization.   Donia Snell, NP 11/02/23  7:21 PM

## 2023-11-02 NOTE — ED Notes (Signed)
 Pt is observed as calm and cooperative. She denies SI/HI/AVH at this time. She c/o headache rated 7/10 and constipation for over a week. PRNs given as per NP order. She appears as mildly anxious. She voiced no other physical complaints. She is seen eating and drinking well. She is med compliant and safe on the unit at this time with continuous observation.

## 2023-11-02 NOTE — Progress Notes (Signed)
 Inpatient Psychiatric Referral  Patient was recommended inpatient per Donia Snell NP . There are no available beds at [facility], per [facility] Cherylynn Ernst, RN . Patient was referred to the following out of network facilities:  Destination  Service Provider Request Status Address Phone Fax  [facility]  Pending - No Request Sent [address], [place] [address] [phone]  [facility]  Pending - No Request Sent [address] [place] [address] [phone]  [facility]  Pending - No Request Sent [address] [place] [address] [phone]  [facility]  Pending - No Request Sent [address], [place] [address] [phone]  [facility]  Pending - No Request Sent [address] [place] [address] [phone]  [facility]  Pending - No Request Sent [address] [place] [address] [phone]  [facility]  Pending - No Request Sent [address] [place] [phone]  [facility]  Pending - No Request Sent [address] [place] [address] [phone]    Situation ongoing, CSW to continue following and update chart as more information becomes available.   Harrie Sofia MSW, [place] 11/02/2023

## 2023-11-03 ENCOUNTER — Encounter (HOSPITAL_COMMUNITY): Payer: Self-pay | Admitting: Psychiatry

## 2023-11-03 ENCOUNTER — Other Ambulatory Visit: Payer: Self-pay

## 2023-11-03 ENCOUNTER — Inpatient Hospital Stay (HOSPITAL_COMMUNITY)
Admission: AD | Admit: 2023-11-03 | Discharge: 2023-11-09 | DRG: 885 | Disposition: A | Discharge status: Home / Self Care | Payer: MEDICAID | Source: Intra-hospital | Attending: Psychiatry | Admitting: Psychiatry

## 2023-11-03 DIAGNOSIS — Z8619 Personal history of other infectious and parasitic diseases: Secondary | ICD-10-CM

## 2023-11-03 DIAGNOSIS — Z9152 Personal history of nonsuicidal self-harm: Secondary | ICD-10-CM

## 2023-11-03 DIAGNOSIS — F3481 Disruptive mood dysregulation disorder: Secondary | ICD-10-CM | POA: Diagnosis present

## 2023-11-03 DIAGNOSIS — Z818 Family history of other mental and behavioral disorders: Secondary | ICD-10-CM

## 2023-11-03 DIAGNOSIS — G8929 Other chronic pain: Secondary | ICD-10-CM | POA: Diagnosis present

## 2023-11-03 DIAGNOSIS — F17290 Nicotine dependence, other tobacco product, uncomplicated: Secondary | ICD-10-CM | POA: Diagnosis present

## 2023-11-03 DIAGNOSIS — Z79899 Other long term (current) drug therapy: Secondary | ICD-10-CM | POA: Diagnosis not present

## 2023-11-03 DIAGNOSIS — F419 Anxiety disorder, unspecified: Secondary | ICD-10-CM | POA: Diagnosis present

## 2023-11-03 DIAGNOSIS — J45909 Unspecified asthma, uncomplicated: Secondary | ICD-10-CM | POA: Diagnosis present

## 2023-11-03 DIAGNOSIS — R112 Nausea with vomiting, unspecified: Principal | ICD-10-CM

## 2023-11-03 DIAGNOSIS — K219 Gastro-esophageal reflux disease without esophagitis: Secondary | ICD-10-CM | POA: Diagnosis present

## 2023-11-03 DIAGNOSIS — F4310 Post-traumatic stress disorder, unspecified: Secondary | ICD-10-CM | POA: Diagnosis present

## 2023-11-03 DIAGNOSIS — Z91013 Allergy to seafood: Secondary | ICD-10-CM | POA: Diagnosis not present

## 2023-11-03 DIAGNOSIS — F209 Schizophrenia, unspecified: Secondary | ICD-10-CM | POA: Diagnosis present

## 2023-11-03 DIAGNOSIS — Z91010 Allergy to peanuts: Secondary | ICD-10-CM

## 2023-11-03 DIAGNOSIS — I10 Essential (primary) hypertension: Secondary | ICD-10-CM | POA: Diagnosis present

## 2023-11-03 DIAGNOSIS — K2970 Gastritis, unspecified, without bleeding: Secondary | ICD-10-CM | POA: Diagnosis present

## 2023-11-03 DIAGNOSIS — Z7951 Long term (current) use of inhaled steroids: Secondary | ICD-10-CM | POA: Diagnosis not present

## 2023-11-03 DIAGNOSIS — F332 Major depressive disorder, recurrent severe without psychotic features: Secondary | ICD-10-CM | POA: Diagnosis not present

## 2023-11-03 DIAGNOSIS — F913 Oppositional defiant disorder: Secondary | ICD-10-CM | POA: Diagnosis present

## 2023-11-03 DIAGNOSIS — Z9049 Acquired absence of other specified parts of digestive tract: Secondary | ICD-10-CM | POA: Diagnosis not present

## 2023-11-03 DIAGNOSIS — R45851 Suicidal ideations: Secondary | ICD-10-CM | POA: Diagnosis present

## 2023-11-03 DIAGNOSIS — R109 Unspecified abdominal pain: Secondary | ICD-10-CM | POA: Diagnosis present

## 2023-11-03 DIAGNOSIS — Z5986 Financial insecurity: Secondary | ICD-10-CM

## 2023-11-03 DIAGNOSIS — Z91018 Allergy to other foods: Secondary | ICD-10-CM | POA: Diagnosis not present

## 2023-11-03 DIAGNOSIS — F329 Major depressive disorder, single episode, unspecified: Secondary | ICD-10-CM | POA: Diagnosis present

## 2023-11-03 DIAGNOSIS — E860 Dehydration: Secondary | ICD-10-CM | POA: Diagnosis present

## 2023-11-03 MED ORDER — MAGNESIUM HYDROXIDE 400 MG/5ML PO SUSP: 30.0000 mL | Freq: Every day | ORAL | Status: DC | PRN

## 2023-11-03 MED ORDER — NICOTINE POLACRILEX 2 MG MT GUM
2.0000 mg | CHEWING_GUM | OROMUCOSAL | Status: DC | PRN
  Filled 2023-11-03 (×2): qty 1

## 2023-11-03 MED ORDER — ENSURE PLUS HIGH PROTEIN PO LIQD
237.0000 mL | Freq: Two times a day (BID) | ORAL | Status: DC
  Administered 2023-11-04 – 2023-11-08 (×6): 237 mL via ORAL
  Filled 2023-11-03 (×14): qty 237

## 2023-11-03 MED ORDER — HALOPERIDOL LACTATE 5 MG/ML IJ SOLN: 10.0000 mg | Freq: Three times a day (TID) | INTRAMUSCULAR | Status: DC | PRN

## 2023-11-03 MED ORDER — LORAZEPAM 2 MG/ML IJ SOLN: 2.0000 mg | Freq: Three times a day (TID) | INTRAMUSCULAR | Status: DC | PRN

## 2023-11-03 MED ORDER — HALOPERIDOL 5 MG PO TABS
5.0000 mg | ORAL_TABLET | Freq: Three times a day (TID) | ORAL | Status: DC | PRN
  Administered 2023-11-07 – 2023-11-09 (×2): 5 mg via ORAL
  Filled 2023-11-03 (×2): qty 1

## 2023-11-03 MED ORDER — DIPHENHYDRAMINE HCL 50 MG/ML IJ SOLN: 50.0000 mg | Freq: Three times a day (TID) | INTRAMUSCULAR | Status: DC | PRN

## 2023-11-03 MED ORDER — ACETAMINOPHEN 325 MG PO TABS
650.0000 mg | ORAL_TABLET | Freq: Four times a day (QID) | ORAL | Status: DC | PRN
  Administered 2023-11-06 – 2023-11-07 (×3): 650 mg via ORAL
  Filled 2023-11-03 (×3): qty 2

## 2023-11-03 MED ORDER — DIPHENHYDRAMINE HCL 25 MG PO CAPS
50.0000 mg | ORAL_CAPSULE | Freq: Three times a day (TID) | ORAL | Status: DC | PRN
  Administered 2023-11-07 – 2023-11-09 (×2): 50 mg via ORAL
  Filled 2023-11-03 (×2): qty 2

## 2023-11-03 MED ORDER — TRAZODONE HCL 50 MG PO TABS
50.0000 mg | ORAL_TABLET | Freq: Every evening | ORAL | Status: DC | PRN
  Administered 2023-11-04 – 2023-11-08 (×5): 50 mg via ORAL
  Filled 2023-11-03 (×5): qty 1

## 2023-11-03 MED ORDER — ALUM & MAG HYDROXIDE-SIMETH 200-200-20 MG/5ML PO SUSP: 30.0000 mL | ORAL | Status: DC | PRN

## 2023-11-03 MED ORDER — HALOPERIDOL LACTATE 5 MG/ML IJ SOLN: 5.0000 mg | Freq: Three times a day (TID) | INTRAMUSCULAR | Status: DC | PRN

## 2023-11-03 MED ORDER — HYDROXYZINE HCL 25 MG PO TABS
25.0000 mg | ORAL_TABLET | Freq: Three times a day (TID) | ORAL | Status: DC | PRN
Start: 2023-11-03 — End: 2023-11-09
  Administered 2023-11-04 – 2023-11-08 (×7): 25 mg via ORAL
  Filled 2023-11-03 (×7): qty 1

## 2023-11-03 NOTE — Group Note (Unsigned)
 Date:  11/03/2023 Time:  9:56 PM  Group Topic/Focus:  Wrap-Up Group:   The focus of this group is to help patients review their daily goal of treatment and discuss progress on daily workbooks.     Participation Level:  {BHH PARTICIPATION OZCZO:77735}  Participation Quality:  {BHH PARTICIPATION QUALITY:22265}  Affect:  {BHH AFFECT:22266}  Cognitive:  {BHH COGNITIVE:22267}  Insight: {BHH Insight2:20797}  Engagement in Group:  {BHH ENGAGEMENT IN HMNLE:77731}  Modes of Intervention:  {BHH MODES OF INTERVENTION:22269}  Additional Comments:  ***  Gwenn Nobie [place] 11/03/2023, 9:56 PM

## 2023-11-03 NOTE — ED Notes (Addendum)
 Pt irritable upon awakening. Denies SI/HI/AVH. Will continue to monitor.

## 2023-11-03 NOTE — Plan of Care (Signed)

## 2023-11-03 NOTE — ED Notes (Signed)
 Pt sleeping at this time. Rise and fall of chest noted. Pt in NAD at this time. Will continue to monitor.

## 2023-11-03 NOTE — Progress Notes (Signed)
 Amy Wall is a 18 y.o. female involuntarily admitted for  having thoughts of not wanting to live. Pt mentions she has been feeling this way for 2-3 weeks and she finds herself not using any coping skills. Pt reports that she is afraid that she might end her life if she does not get help.  Pt reports daily marijuana use. Pt denies SI/HI, AVH at this time, alert and oriented, cooperative with admission process. Consents signed, skin/belongings search completed and pt oriented to unit. Pt stable at this time. Pt given the opportunity to express concerns and ask questions. Pt given toiletries. Will continue to monitor.

## 2023-11-03 NOTE — Discharge Instructions (Signed)

## 2023-11-03 NOTE — Tx Team (Signed)
 Initial Treatment Plan 11/03/2023 11:29 PM Amy Wall FMW:[id number]    PATIENT STRESSORS: Educational concerns   Financial difficulties   Marital or family conflict   Substance abuse   Traumatic event     PATIENT STRENGTHS: Printmaker for treatment/growth  Physical Health    PATIENT IDENTIFIED PROBLEMS: Depression  Substance use  Anger management  Depression               DISCHARGE CRITERIA:  Ability to meet basic life and health needs Improved stabilization in mood, thinking, and/or behavior Verbal commitment to aftercare and medication compliance  PRELIMINARY DISCHARGE PLAN: Attend aftercare/continuing care group Attend PHP/IOP Outpatient therapy Return to previous living arrangement  PATIENT/FAMILY INVOLVEMENT: This treatment plan has been presented to and reviewed with the patient, [name], and/or family member.  The patient and family have been given the opportunity to ask questions and make suggestions.  Slater MALVA Edin, RN 11/03/2023, 11:29 PM
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
## 2023-11-03 NOTE — ED Notes (Signed)
 Patient observed/assessed in bed/chair resting quietly appearing in no distress and verbalizing no complaints at this time. Will continue to monitor.

## 2023-11-04 ENCOUNTER — Encounter (HOSPITAL_COMMUNITY): Payer: Self-pay

## 2023-11-04 MED ORDER — ARIPIPRAZOLE 15 MG PO TABS: 15.0000 mg | ORAL_TABLET | Freq: Every day | ORAL | Status: DC

## 2023-11-04 MED ORDER — SERTRALINE HCL 100 MG PO TABS
100.0000 mg | ORAL_TABLET | Freq: Every day | ORAL | Status: DC
  Administered 2023-11-04 – 2023-11-09 (×6): 100 mg via ORAL
  Filled 2023-11-04: qty 2
  Filled 2023-11-04 (×2): qty 1
  Filled 2023-11-04 (×3): qty 2

## 2023-11-04 MED ORDER — ARIPIPRAZOLE 15 MG PO TABS
15.0000 mg | ORAL_TABLET | Freq: Every day | ORAL | Status: DC
  Administered 2023-11-06 – 2023-11-09 (×4): 15 mg via ORAL
  Filled 2023-11-04 (×4): qty 1

## 2023-11-04 MED ORDER — PANTOPRAZOLE SODIUM 20 MG PO TBEC
20.0000 mg | DELAYED_RELEASE_TABLET | Freq: Once | ORAL | Status: AC
  Administered 2023-11-04: 20 mg via ORAL
  Filled 2023-11-04: qty 1

## 2023-11-04 MED ORDER — ARIPIPRAZOLE 10 MG PO TABS
10.0000 mg | ORAL_TABLET | Freq: Once | ORAL | Status: AC
Start: 2023-11-05 — End: 2023-11-05
  Administered 2023-11-05: 10 mg via ORAL
  Filled 2023-11-04: qty 1

## 2023-11-04 MED ORDER — ALBUTEROL SULFATE HFA 108 (90 BASE) MCG/ACT IN AERS
2.0000 | INHALATION_SPRAY | Freq: Four times a day (QID) | RESPIRATORY_TRACT | Status: DC | PRN
  Administered 2023-11-04 – 2023-11-08 (×3): 2 via RESPIRATORY_TRACT
  Filled 2023-11-04 (×2): qty 6.7

## 2023-11-04 MED ORDER — PANTOPRAZOLE SODIUM 40 MG PO TBEC
40.0000 mg | DELAYED_RELEASE_TABLET | Freq: Two times a day (BID) | ORAL | Status: DC
  Administered 2023-11-04 – 2023-11-09 (×10): 40 mg via ORAL
  Filled 2023-11-04 (×10): qty 1

## 2023-11-04 MED ORDER — PANTOPRAZOLE SODIUM 20 MG PO TBEC: 20.0000 mg | DELAYED_RELEASE_TABLET | Freq: Two times a day (BID) | ORAL | Status: DC

## 2023-11-04 MED ORDER — ONDANSETRON 4 MG PO TBDP
8.0000 mg | ORAL_TABLET | Freq: Once | ORAL | Status: AC
  Administered 2023-11-04: 8 mg via ORAL
  Filled 2023-11-04: qty 2

## 2023-11-04 MED ORDER — ONDANSETRON 4 MG PO TBDP
4.0000 mg | ORAL_TABLET | Freq: Three times a day (TID) | ORAL | Status: DC | PRN
  Administered 2023-11-04 – 2023-11-06 (×5): 4 mg via ORAL
  Filled 2023-11-04 (×5): qty 1

## 2023-11-04 MED ORDER — FLUTICASONE FUROATE-VILANTEROL 100-25 MCG/ACT IN AEPB
1.0000 | INHALATION_SPRAY | Freq: Every day | RESPIRATORY_TRACT | Status: DC
  Administered 2023-11-04 – 2023-11-09 (×5): 1 via RESPIRATORY_TRACT
  Filled 2023-11-04 (×2): qty 28

## 2023-11-04 MED ORDER — ARIPIPRAZOLE 5 MG PO TABS
5.0000 mg | ORAL_TABLET | Freq: Once | ORAL | Status: AC
Start: 2023-11-04 — End: 2023-11-04
  Administered 2023-11-04: 5 mg via ORAL
  Filled 2023-11-04: qty 1
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
## 2023-11-04 NOTE — Group Note (Signed)
 Recreation Therapy Group Note   Group Topic:Relaxation  Group Date: 11/04/2023 Start Time: 1030 End Time: 1106 Facilitators: Brooklee Michelin-McCall, LRT,CTRS Location: [address] Dayroom   Group Topic: Stress Management  Goal Area(s) Addresses:  Patient will identify positive stress management techniques. Patient will identify benefits of using stress management post d/c.  Behavioral Response: Engaged  Intervention: Music App, Speaker  Activity: LRT discussed the importance of relaxation with group. Patients were given the opportunity to suggest songs that have meaning to them. LRT would play those songs for the patients to give them a sense of peace.     Education:  Stress Management, Discharge Planning.   Education Outcome: Acknowledges Education   Affect/Mood: Appropriate   Participation Level: Engaged   Participation Quality: Independent   Behavior: Appropriate   Speech/Thought Process: Focused   Insight: Good   Judgement: Good   Modes of Intervention: Music   Patient Response to Interventions:  Engaged   Education Outcome:  In group clarification offered    Clinical Observations/Individualized Feedback: Pt was bright. Pt kept asking about seeing a doctor and how long she would be here. Pt also talked about feeling overheated and needing to cool off. Pt did sing a long to some of the songs as well.     Plan: Continue to engage patient in RT group sessions 2-3x/week.   Nikeia Henkes-McCall, LRT,CTRS 11/04/2023 1:52 PM

## 2023-11-04 NOTE — Plan of Care (Addendum)
 Pt presents A & O X3. Denies SI, HI, AVH and pain when assessed. Observed with mood lability as evidenced by intermittent animation and tearfulness. Approached writer crying with increased anxiety, after storming out of dayroom in a loud cry I think it's what I took, weed. Emesis X 2 this shift consisting of undigested food particles. Assigned provider made aware; new order received for Zofran  and Protonix  due to pt's history of H-pyloric. Visible in dayroom at intervals, remains verbally redirectable. PRN Vistaril  25 mg PO given at 1117 and was effective when reassessed at 1210. Emotional support, encouragement and reassurance offered. Safety checks maintained at Q 15 minutes intervals.  Problem: Activity: Goal: Sleeping patterns will improve Outcome: Progressing   Problem: Coping: Goal: Ability to demonstrate self-control will improve Outcome: Progressing   Problem: Safety: Goal: Periods of time without injury will increase Outcome: Progressing

## 2023-11-04 NOTE — Group Note (Signed)
 Date:  11/04/2023 Time:  4:13 PM  Group Topic/Focus:  Goals Group:   The focus of this group is to help patients establish daily goals to achieve during treatment and discuss how the patient can incorporate goal setting into their daily lives to aide in recovery. Orientation:   The focus of this group is to educate the patient on the purpose and policies of crisis stabilization and provide a format to answer questions about their admission.  The group details unit policies and expectations of patients while admitted.    Participation Level:  Did Not Attend  Amy Wall Molly 11/04/2023, 4:13 PM
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
## 2023-11-04 NOTE — Plan of Care (Signed)
   Problem: Education: Goal: Knowledge of [facility] [facility] Education information/materials will improve Outcome: Progressing Goal: Emotional status will improve Outcome: Progressing Goal: Mental status will improve Outcome: Progressing

## 2023-11-04 NOTE — BH IP Treatment Plan (Signed)
 Interdisciplinary Treatment and Diagnostic Plan Update  11/04/2023 Time of Session: 10:55 AM Amy Wall MRN: [id number]  Principal Diagnosis: MDD (major depressive disorder), recurrent severe, without psychosis (HCC)  Secondary Diagnoses: Principal Problem:   MDD (major depressive disorder), recurrent severe, without psychosis (HCC) Active Problems:   PTSD (post-traumatic stress disorder)   Gastroesophageal reflux disease without esophagitis   MDD (major depressive disorder)   Current Medications:  Current Facility-Administered Medications  Medication Dose Route Frequency Provider Last Rate Last Admin   acetaminophen  (TYLENOL ) tablet 650 mg  650 mg Oral Q6H PRN Hoang, Daniela B, MD       albuterol  (VENTOLIN  HFA) 108 (90 Base) MCG/ACT inhaler 2 puff  2 puff Inhalation Q6H PRN Nwoko, Mac I, NP       alum & mag hydroxide-simeth (MAALOX/MYLANTA) 200-200-20 MG/5ML suspension 30 mL  30 mL Oral Q4H PRN Hoang, Daniela B, MD       [START ON 11/05/2023] ARIPiprazole  (ABILIFY ) tablet 10 mg  10 mg Oral Once Nwoko, Agnes I, NP       [START ON 11/06/2023] ARIPiprazole  (ABILIFY ) tablet 15 mg  15 mg Oral Daily Nwoko, Agnes I, NP       haloperidol  (HALDOL ) tablet 5 mg  5 mg Oral TID PRN Hoang, Daniela B, MD       And   diphenhydrAMINE  (BENADRYL ) capsule 50 mg  50 mg Oral TID PRN Hoang, Daniela B, MD       haloperidol  lactate (HALDOL ) injection 5 mg  5 mg Intramuscular TID PRN Hoang, Daniela B, MD       And   diphenhydrAMINE  (BENADRYL ) injection 50 mg  50 mg Intramuscular TID PRN Hoang, Daniela B, MD       And   LORazepam  (ATIVAN ) injection 2 mg  2 mg Intramuscular TID PRN Hoang, Daniela B, MD       haloperidol  lactate (HALDOL ) injection 10 mg  10 mg Intramuscular TID PRN Hoang, Daniela B, MD       And   diphenhydrAMINE  (BENADRYL ) injection 50 mg  50 mg Intramuscular TID PRN Hoang, Daniela B, MD       And   LORazepam  (ATIVAN ) injection 2 mg  2 mg Intramuscular TID PRN Hoang, Daniela B, MD        feeding supplement (ENSURE PLUS HIGH PROTEIN) liquid 237 mL  237 mL Oral BID BM Trudy Carwin, NP   237 mL at 11/04/23 1500   fluticasone  furoate-vilanterol (BREO ELLIPTA ) 100-25 MCG/ACT 1 puff  1 puff Inhalation Daily Collene Mac I, NP   1 puff at 11/04/23 1117   hydrOXYzine  (ATARAX ) tablet 25 mg  25 mg Oral TID PRN Hoang, Daniela B, MD   25 mg at 11/04/23 1117   magnesium  hydroxide (MILK OF MAGNESIA) suspension 30 mL  30 mL Oral Daily PRN Hoang, Daniela B, MD       nicotine  polacrilex (NICORETTE ) gum 2 mg  2 mg Oral PRN Trudy Carwin, NP       ondansetron  (ZOFRAN -ODT) disintegrating tablet 4 mg  4 mg Oral Q8H PRN Nwoko, Agnes I, NP       pantoprazole  (PROTONIX ) EC tablet 40 mg  40 mg Oral BID AC Nwoko, Agnes I, NP   40 mg at 11/04/23 1704   sertraline  (ZOLOFT ) tablet 100 mg  100 mg Oral Daily Nwoko, Agnes I, NP   100 mg at 11/04/23 1704   traZODone  (DESYREL ) tablet 50 mg  50 mg Oral QHS PRN Hoang, Daniela B, MD  PTA Medications: Medications Prior to Admission  Medication Sig Dispense Refill Last Dose/Taking   albuterol  (VENTOLIN  HFA) 108 (90 Base) MCG/ACT inhaler Inhale 2 puffs into the lungs every 6 (six) hours as needed for wheezing or shortness of breath. 18 g 2    ARIPiprazole  (ABILIFY ) 15 MG tablet Take 15 mg by mouth daily.      budesonide -formoterol  (SYMBICORT ) 80-4.5 MCG/ACT inhaler Inhale 2 puffs into the lungs 2 (two) times daily. 10.2 g 12    EPINEPHrine  0.3 mg/0.3 mL IJ SOAJ injection Inject into  mid outer thigh 1 time for 1 dose. (Patient taking differently: Inject 0.3 mg into the muscle as needed for anaphylaxis.) 2 each 0    hydrOXYzine  (ATARAX ) 25 MG tablet Take 1 tablet (25 mg total) by mouth 3 (three) times daily as needed for anxiety. 30 tablet 0    ondansetron  (ZOFRAN -ODT) 4 MG disintegrating tablet Dissolve 1 tablet (4 mg total) by mouth every 8 (eight) hours as needed for nausea or vomiting. 12 tablet 0    pantoprazole  (PROTONIX ) 20 MG tablet Take 1 tablet (20 mg  total) by mouth 2 (two) times daily before a meal. 60 tablet 1    promethazine  (PHENERGAN ) 25 MG suppository Place 1 suppository (25 mg total) rectally every 6 (six) hours as needed for nausea or vomiting. 12 each 1    sertraline  (ZOLOFT ) 100 MG tablet Take 100 mg by mouth daily.      traZODone  (DESYREL ) 100 MG tablet Take 1 tablet (100 mg total) by mouth every night. 30 tablet 0     Patient Stressors: Educational concerns   Financial difficulties   Marital or family conflict   Substance abuse   Traumatic event    Patient Strengths: Printmaker for treatment/growth  Physical Health   Treatment Modalities: Medication Management, Group therapy, Case management,  1 to 1 session with clinician, Psychoeducation, Recreational therapy.   Physician Treatment Plan for Primary Diagnosis: MDD (major depressive disorder), recurrent severe, without psychosis (HCC) Long Term Goal(s): Improvement in symptoms so as ready for discharge   Short Term Goals: Ability to identify and develop effective coping behaviors will improve Ability to maintain clinical measurements within normal limits will improve Compliance with prescribed medications will improve Ability to identify triggers associated with substance abuse/mental health issues will improve Ability to identify changes in lifestyle to reduce recurrence of condition will improve Ability to verbalize feelings will improve Ability to disclose and discuss suicidal ideas Ability to demonstrate self-control will improve  Medication Management: Evaluate patient's response, side effects, and tolerance of medication regimen.  Therapeutic Interventions: 1 to 1 sessions, Unit Group sessions and Medication administration.  Evaluation of Outcomes: Not Progressing  Physician Treatment Plan for Secondary Diagnosis: Principal Problem:   MDD (major depressive disorder), recurrent severe, without psychosis (HCC) Active Problems:   PTSD  (post-traumatic stress disorder)   Gastroesophageal reflux disease without esophagitis   MDD (major depressive disorder)  Long Term Goal(s): Improvement in symptoms so as ready for discharge   Short Term Goals: Ability to identify and develop effective coping behaviors will improve Ability to maintain clinical measurements within normal limits will improve Compliance with prescribed medications will improve Ability to identify triggers associated with substance abuse/mental health issues will improve Ability to identify changes in lifestyle to reduce recurrence of condition will improve Ability to verbalize feelings will improve Ability to disclose and discuss suicidal ideas Ability to demonstrate self-control will improve     Medication Management: Evaluate patient's response,  side effects, and tolerance of medication regimen.  Therapeutic Interventions: 1 to 1 sessions, Unit Group sessions and Medication administration.  Evaluation of Outcomes: Not Progressing   RN Treatment Plan for Primary Diagnosis: MDD (major depressive disorder), recurrent severe, without psychosis (HCC) Long Term Goal(s): Knowledge of disease and therapeutic regimen to maintain health will improve  Short Term Goals: Ability to remain free from injury will improve, Ability to verbalize frustration and anger appropriately will improve, Ability to demonstrate self-control, Ability to participate in decision making will improve, Ability to verbalize feelings will improve, Ability to disclose and discuss suicidal ideas, Ability to identify and develop effective coping behaviors will improve, and Compliance with prescribed medications will improve  Medication Management: RN will administer medications as ordered by provider, will assess and evaluate patient's response and provide education to patient for prescribed medication. RN will report any adverse and/or side effects to prescribing provider.  Therapeutic  Interventions: 1 on 1 counseling sessions, Psychoeducation, Medication administration, Evaluate responses to treatment, Monitor vital signs and CBGs as ordered, Perform/monitor CIWA, COWS, AIMS and Fall Risk screenings as ordered, Perform wound care treatments as ordered.  Evaluation of Outcomes: Not Progressing   LCSW Treatment Plan for Primary Diagnosis: MDD (major depressive disorder), recurrent severe, without psychosis (HCC) Long Term Goal(s): Safe transition to appropriate next level of care at discharge, Engage patient in therapeutic group addressing interpersonal concerns.  Short Term Goals: Engage patient in aftercare planning with referrals and resources, Increase social support, Increase ability to appropriately verbalize feelings, Increase emotional regulation, Facilitate acceptance of mental health diagnosis and concerns, Facilitate patient progression through stages of change regarding substance use diagnoses and concerns, Identify triggers associated with mental health/substance abuse issues, and Increase skills for wellness and recovery  Therapeutic Interventions: Assess for all discharge needs, 1 to 1 time with Social worker, Explore available resources and support systems, Assess for adequacy in community support network, Educate family and significant other(s) on suicide prevention, Complete Psychosocial Assessment, Interpersonal group therapy.  Evaluation of Outcomes: Not Progressing   Progress in Treatment: Attending groups: attended some groups Participating in groups: Yes. Taking medication as prescribed: Yes. Toleration medication: Yes. Family/Significant other contact made: No, will contact DSS Social Worker  / legal guardian Shelba Hummer, [phone] Patient understands diagnosis: No. Discussing patient identified problems/goals with staff: Yes. Medical problems stabilized or resolved: Yes. Denies suicidal/homicidal ideation: Yes. Issues/concerns per patient  self-inventory: No.  New problem(s) identified:  No  New Short Term/Long Term Goal(s):    medication stabilization, elimination of SI thoughts, development of comprehensive mental wellness plan.   Patient Goals:  I want to work on my depression.  Discharge Plan or Barriers:  Patient recently admitted. CSW will continue to follow and assess for appropriate referrals and possible discharge planning.   Reason for Continuation of Hospitalization: Depression Medication stabilization Suicidal ideation  Estimated Length of Stay:  5 - 7 days  Last 3 [place] Suicide Severity Risk Score: Flowsheet Row Ad[facility] (Current[facility] 11[facility]02/2023 in [facility] from 09/26/2023 in [facility])  C-SSRS RISK CATEGORY Low Risk High Risk No Risk    Last PHQ 2/9 Scores:    10/20/2023    1:11 PM 01/20/2023    2:21 PM 08/23/2022    3:40 AM  Depression screen PHQ 2/9  Decreased Interest 1 0 1  Down, Depressed, Hopeless 0 2 1  PHQ - 2  Score 1 2 2   Altered sleeping  3 1  Tired, decreased energy  2 1  Change in appetite  1 1  Feeling bad or failure about yourself   0 1  Trouble concentrating  0 1  Moving slowly or fidgety/restless  0 1  Suicidal thoughts  0 1  PHQ-9 Score  8 9  Difficult doing work/chores  Not difficult at all Somewhat difficult    Scribe for Treatment Team: Sway Guttierrez O Samyra Limb, LCSWA 11/04/2023 7:02 PM

## 2023-11-05 LAB — LIPASE, BLOOD: Lipase: 30 U/L (ref 11–51)

## 2023-11-05 LAB — CBC WITH DIFFERENTIAL/PLATELET
Abs Immature Granulocytes: 0.02 10*3/uL (ref 0.00–0.07)
Basophils Absolute: 0 10*3/uL (ref 0.0–0.1)
Basophils Relative: 0 %
Eosinophils Absolute: 0 10*3/uL (ref 0.0–0.5)
Eosinophils Relative: 0 %
HCT: 39.8 % (ref 36.0–46.0)
Hemoglobin: 13.3 g/dL (ref 12.0–15.0)
Immature Granulocytes: 0 %
Lymphocytes Relative: 24 %
Lymphs Abs: 1.9 10*3/uL (ref 0.7–4.0)
MCH: 29.6 pg (ref 26.0–34.0)
MCHC: 33.4 g/dL (ref 30.0–36.0)
MCV: 88.6 fL (ref 80.0–100.0)
Monocytes Absolute: 0.5 10*3/uL (ref 0.1–1.0)
Monocytes Relative: 6 %
Neutro Abs: 5.8 10*3/uL (ref 1.7–7.7)
Neutrophils Relative %: 70 %
Platelets: 401 10*3/uL — ABNORMAL HIGH (ref 150–400)
RBC: 4.49 MIL/uL (ref 3.87–5.11)
RDW: 11.9 % (ref 11.5–15.5)
WBC: 8.2 10*3/uL (ref 4.0–10.5)
nRBC: 0 % (ref 0.0–0.2)

## 2023-11-05 LAB — COMPREHENSIVE METABOLIC PANEL WITH GFR
ALT: 14 U/L (ref 0–44)
AST: 23 U/L (ref 15–41)
Albumin: 4.4 g/dL (ref 3.5–5.0)
Alkaline Phosphatase: 113 U/L (ref 38–126)
Anion gap: 14 (ref 5–15)
BUN: 18 mg/dL (ref 6–20)
CO2: 22 mmol/L (ref 22–32)
Calcium: 9.8 mg/dL (ref 8.9–10.3)
Chloride: 99 mmol/L (ref 98–111)
Creatinine, Ser: 0.55 mg/dL (ref 0.44–1.00)
GFR, Estimated: >60 mL/min (ref >60)
Glucose, Bld: 86 mg/dL (ref 70–99)
Potassium: 3.5 mmol/L (ref 3.5–5.1)
Sodium: 135 mmol/L (ref 135–145)
Total Bilirubin: 0.7 mg/dL (ref 0.0–1.2)
Total Protein: 9.2 g/dL — ABNORMAL HIGH (ref 6.5–8.1)

## 2023-11-05 MED ORDER — ALUM & MAG HYDROXIDE-SIMETH 200-200-20 MG/5ML PO SUSP
30.0000 mL | Freq: Once | ORAL | Status: AC
  Administered 2023-11-05: 30 mL via ORAL
  Filled 2023-11-05: qty 30

## 2023-11-05 MED ORDER — DROPERIDOL 2.5 MG/ML IJ SOLN
2.5000 mg | Freq: Once | INTRAMUSCULAR | Status: AC
  Administered 2023-11-05: 2.5 mg via INTRAVENOUS
  Filled 2023-11-05: qty 2

## 2023-11-05 MED ORDER — SODIUM CHLORIDE 0.9 % IV BOLUS
1000.0000 mL | Freq: Once | INTRAVENOUS | Status: AC
  Administered 2023-11-05: 1000 mL via INTRAVENOUS

## 2023-11-05 NOTE — Progress Notes (Signed)
 patient inducing herself to vomit, PRN zofran  4mg  given @ 1159. Patient stated its not working and continuing to induce herself to vomit, disrupting the unit. Patient refusing to eat. NP notified, orders for patient to be sent out.

## 2023-11-05 NOTE — Progress Notes (Signed)
   11/05/23 2300  Psych Admission Type (Psych Patients Only)  Admission Status Involuntary  Psychosocial Assessment  Patient Complaints Anxiety;Insomnia;Irritability  Eye Contact Fair  Facial Expression Flat  Affect Anxious  Speech Logical/coherent  Interaction Assertive  Motor Activity Slow  Appearance/Hygiene Disheveled  Behavior Characteristics Cooperative  Mood Anxious  Thought Process  Coherency WDL  Content WDL  Delusions None reported or observed  Perception WDL  Hallucination None reported or observed  Judgment Poor  Confusion None  Danger to Self  Current suicidal ideation? Denies  Description of Suicide Plan None  Agreement Not to Harm Self Yes  Description of Agreement Verbal contract for safety  Danger to Others  Danger to Others None reported or observed

## 2023-11-05 NOTE — Plan of Care (Signed)
   Problem: Education: Goal: Emotional status will improve Outcome: Progressing Goal: Mental status will improve Outcome: Progressing   Problem: Safety: Goal: Periods of time without injury will increase Outcome: Progressing

## 2023-11-05 NOTE — ED Triage Notes (Signed)
 Pt arrives from [facility], has had 3 days nvd, was dx with h pylori in January. Zofran  earlier today with no relief. Dry heaving 110/68 HR 70 CBG 82
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
## 2023-11-05 NOTE — BH Assessment (Addendum)
 Patient was in bed at beginning of shift. Nurse from last shift reported that patient had been vomiting earlier in the day d/t having H pyloric. Day shift nurse gave patient medication and reported that patient has been resting since that time. At approx 2100 last evening patient woke up and started vomiting small amounts of clear liquid and c/o feeling light headed and stomach burning and she had not ate all day. Patient was breathing hard and room temp was at 85. NP was notified. VS T98.9, R 22, P 112, B/P 121/88, O2sat 100%  Patient was instructed to slow breathing and stop taking deep breaths, po zofran  given, room temp was adjusted to 72, and patient was given warm compresses for her stomach. Patient did hav one more emesis of clear liquid. She was given ginger ale and a couple packets of saltine crackers and requested prn night meds. Patient is resting well with eyes closed at this time, resp even and unlabored, 15 min checks maintained.

## 2023-11-05 NOTE — Plan of Care (Signed)

## 2023-11-05 NOTE — Progress Notes (Signed)
 Patient left unit @ 1335 via EMS accompanied by MHT. Report given to [employer] [facility] [phone].

## 2023-11-05 NOTE — ED Notes (Signed)
 [facility] called for transport back to [facility]

## 2023-11-05 NOTE — Progress Notes (Signed)
 [facility] MD Progress Note  11/05/2023 2:45 PM Amy Wall  MRN:  [id number]  Reason for admission: 18 year old AA female with hx of mental health issues. Admitted to the [facility] from the [facility] with complaint of suicidal ideations /thoughts of 3 weeks. Patient is currently under an IVC petition. She presented to the [facility] accompanied by her therapist, stating that she has not been herself lately. Pt also mentioned how she is having thoughts of not wanting to live. Pt mentions she has been feeling this way for 2-3 weeks and she finds herself not using any coping skills. Pt reports that she is afraid that she might end her life if she does not get help.   Daily notes: [name] is seen in her room this morning. She presents alert & oriented. She is making a good eye contact. She reports, My mood is better so far, but I'm still feeling nauseated. I was up all night throwing-up. I got H pylori. That is the reason I can't keeping nothing down. This afternoon, the staff reports that patient may be inducing herself to vomit. As this provider went into patient's room to re-assess her about the N/V, patient reports, I don't think I'm well. I got H Pylori. I don't think that I have vomited this much ever. Patient is now transferred to the ED as she states that she was unable to hold the Zofran -ODT given to her by her nurse because she threw it up. She consents to the ED transfer for medical evaluation/treatment. She is currently out of the unit.  Principal Problem: MDD (major depressive disorder), recurrent severe, without psychosis (HCC)  Diagnosis: Principal Problem:   MDD (major depressive disorder), recurrent severe, without psychosis (HCC) Active Problems:   PTSD (post-traumatic stress disorder)   Gastroesophageal reflux disease without esophagitis   MDD (major depressive disorder)  Total Time spent with patient: 35 minutes  Past Psychiatric History: See H&P.  Past Medical History:  Past Medical History:   Diagnosis Date   Anxiety    Asthma    DMDD (disruptive mood dysregulation disorder) (HCC)    Epilepsy (HCC)    pseuodoseizures per chart   HTN (hypertension)    Major depressive disorder    Oppositional defiant disorder    PTSD (post-traumatic stress disorder)    Schizophrenia (HCC)    Seizures (HCC)     Past Surgical History:  Procedure Laterality Date   BACK SURGERY     CHOLECYSTECTOMY, LAPAROSCOPIC     Family History:  Family History  Problem Relation Age of Onset   Allergic rhinitis Father    Eczema Sister    Allergic rhinitis Sister    Colon cancer Neg Hx    Rectal cancer Neg Hx    Stomach cancer Neg Hx    Esophageal cancer Neg Hx    Family Psychiatric  History: See H&P.  Social History:  Social History   Substance and Sexual Activity  Alcohol Use Not Currently   Alcohol/week: 4.0 standard drinks of alcohol   Types: 2 Glasses of wine, 2 Shots of liquor per week     Social History   Substance and Sexual Activity  Drug Use Not Currently   Types: Marijuana, Methamphetamines   Comment: daily    Social History   Socioeconomic History   Marital status: Single    Spouse name: Not on file   Number of children: 0   Years of education: Not on file   Highest education level: Not on file  Occupational History   Not on file  Tobacco Use   Smoking status: Every Day    Types: Cigars    Passive exposure: Yes   Smokeless tobacco: Never   Tobacco comments:    black and mild, marijuana  Vaping Use   Vaping status: Every Day  Substance and Sexual Activity   Alcohol use: Not Currently    Alcohol/week: 4.0 standard drinks of alcohol    Types: 2 Glasses of wine, 2 Shots of liquor per week   Drug use: Not Currently    Types: Marijuana, Methamphetamines    Comment: daily   Sexual activity: Not Currently    Birth control/protection: Condom, Injection  Other Topics Concern   Not on file  Social History Narrative   She lives with her uncle and aunt.   She has  six siblings.   Social Drivers of Health   Financial Resource Strain: Medium Risk (06/28/2022)   Overall Financial Resource Strain (CARDIA)    Difficulty of Paying Living Expenses: Somewhat hard  Food Insecurity: Patient Declined (11/03/2023)   Hunger Vital Sign    Worried About Running Out of Food in the Last Year: Patient declined    Ran Out of Food in the Last Year: Patient declined  Transportation Needs: Patient Declined (11/03/2023)   PRAPARE - Administrator, Civil Service (Medical): Patient declined    Lack of Transportation (Non-Medical): Patient declined  Physical Activity: Inactive (06/28/2022)   Exercise Vital Sign    Days of Exercise per Week: 0 days    Minutes of Exercise per Session: 0 min  Stress: No Stress Concern Present (06/28/2022)   [employer] of Occupational Health - Occupational Stress Questionnaire    Feeling of Stress : Only a little  Social Connections: Patient Declined (11/03/2023)   Social Connection and Isolation Panel    Frequency of Communication with Friends and Family: Patient declined    Frequency of Social Gatherings with Friends and Family: Patient declined    Attends Religious Services: Patient declined    Database administrator or Organizations: Patient declined    Attends Engineer, structural: Patient declined    Marital Status: Patient declined   Additional Social History:   Sleep: Good Estimated Sleeping Duration (Last 24 Hours): 7.00-9.00 hours  Appetite:  Fair  Current Medications: Current Facility-Administered Medications  Medication Dose Route Frequency Provider Last Rate Last Admin   acetaminophen  (TYLENOL ) tablet 650 mg  650 mg Oral Q6H PRN Hoang, Daniela B, MD       albuterol  (VENTOLIN  HFA) 108 (90 Base) MCG/ACT inhaler 2 puff  2 puff Inhalation Q6H PRN Collene Gouge I, NP   2 puff at 11/04/23 2138   alum & mag hydroxide-simeth (MAALOX/MYLANTA) 200-200-20 MG/5ML suspension 30 mL  30 mL Oral Q4H PRN Hoang,  Daniela B, MD       alum & mag hydroxide-simeth (MAALOX/MYLANTA) 200-200-20 MG/5ML suspension 30 mL  30 mL Oral Once Scheving, William L, MD       [START ON 11/06/2023] ARIPiprazole  (ABILIFY ) tablet 15 mg  15 mg Oral Daily Jaevon Paras I, NP       haloperidol  (HALDOL ) tablet 5 mg  5 mg Oral TID PRN Hoang, Daniela B, MD       And   diphenhydrAMINE  (BENADRYL ) capsule 50 mg  50 mg Oral TID PRN Hoang, Daniela B, MD       haloperidol  lactate (HALDOL ) injection 5 mg  5 mg Intramuscular TID PRN Hoang, Daniela B,  MD       And   diphenhydrAMINE  (BENADRYL ) injection 50 mg  50 mg Intramuscular TID PRN Hoang, Daniela B, MD       And   LORazepam  (ATIVAN ) injection 2 mg  2 mg Intramuscular TID PRN Hoang, Daniela B, MD       haloperidol  lactate (HALDOL ) injection 10 mg  10 mg Intramuscular TID PRN Hoang, Daniela B, MD       And   diphenhydrAMINE  (BENADRYL ) injection 50 mg  50 mg Intramuscular TID PRN Hoang, Daniela B, MD       And   LORazepam  (ATIVAN ) injection 2 mg  2 mg Intramuscular TID PRN Hoang, Daniela B, MD       droperidol (INAPSINE) 2.5 MG/ML injection 2.5 mg  2.5 mg Intravenous Once Francesca Elsie CROME, MD       feeding supplement (ENSURE PLUS HIGH PROTEIN) liquid 237 mL  237 mL Oral BID BM Trudy Carwin, NP   237 mL at 11/04/23 1500   fluticasone  furoate-vilanterol (BREO ELLIPTA ) 100-25 MCG/ACT 1 puff  1 puff Inhalation Daily Collene Gouge I, NP   1 puff at 11/04/23 1117   hydrOXYzine  (ATARAX ) tablet 25 mg  25 mg Oral TID PRN Hoang, Daniela B, MD   25 mg at 11/04/23 2133   magnesium  hydroxide (MILK OF MAGNESIA) suspension 30 mL  30 mL Oral Daily PRN Hoang, Daniela B, MD       nicotine  polacrilex (NICORETTE ) gum 2 mg  2 mg Oral PRN Trudy Carwin, NP       ondansetron  (ZOFRAN -ODT) disintegrating tablet 4 mg  4 mg Oral Q8H PRN Collene Gouge I, NP   4 mg at 11/05/23 1159   pantoprazole  (PROTONIX ) EC tablet 40 mg  40 mg Oral BID AC Bentleigh Waren, Gouge I, NP   40 mg at 11/05/23 1012   sertraline  (ZOLOFT ) tablet  100 mg  100 mg Oral Daily Alden Bensinger I, NP   100 mg at 11/05/23 1012   sodium chloride  0.9 % bolus 1,000 mL  1,000 mL Intravenous Once Francesca Elsie CROME, MD       traZODone  (DESYREL ) tablet 50 mg  50 mg Oral QHS PRN Hoang, Daniela B, MD   50 mg at 11/04/23 2134   Current Outpatient Medications  Medication Sig Dispense Refill   albuterol  (VENTOLIN  HFA) 108 (90 Base) MCG/ACT inhaler Inhale 2 puffs into the lungs every 6 (six) hours as needed for wheezing or shortness of breath. 18 g 2   ARIPiprazole  (ABILIFY ) 15 MG tablet Take 15 mg by mouth daily.     budesonide -formoterol  (SYMBICORT ) 80-4.5 MCG/ACT inhaler Inhale 2 puffs into the lungs 2 (two) times daily. 10.2 g 12   EPINEPHrine  0.3 mg/0.3 mL IJ SOAJ injection Inject into  mid outer thigh 1 time for 1 dose. (Patient taking differently: Inject 0.3 mg into the muscle as needed for anaphylaxis.) 2 each 0   hydrOXYzine  (ATARAX ) 25 MG tablet Take 1 tablet (25 mg total) by mouth 3 (three) times daily as needed for anxiety. 30 tablet 0   ondansetron  (ZOFRAN -ODT) 4 MG disintegrating tablet Dissolve 1 tablet (4 mg total) by mouth every 8 (eight) hours as needed for nausea or vomiting. 12 tablet 0   pantoprazole  (PROTONIX ) 20 MG tablet Take 1 tablet (20 mg total) by mouth 2 (two) times daily before a meal. 60 tablet 1   promethazine  (PHENERGAN ) 25 MG suppository Place 1 suppository (25 mg total) rectally every 6 (six) hours as needed for nausea  or vomiting. 12 each 1   sertraline  (ZOLOFT ) 100 MG tablet Take 100 mg by mouth daily.     traZODone  (DESYREL ) 100 MG tablet Take 1 tablet (100 mg total) by mouth every night. 30 tablet 0   Lab Results: No results found for this or any previous visit (from the past 48 hours).  Blood Alcohol level:  Lab Results  Component Value Date   [facility] <15 11/02/2023   ETH <10 06/21/2023    Metabolic Disorder Labs: Lab Results  Component Value Date   HGBA1C 4.8 11/02/2023   MPG 91.06 11/02/2023   MPG 100 09/26/2019    Lab Results  Component Value Date   PROLACTIN 13.8 11/03/2020   PROLACTIN 18.2 01/27/2017   Lab Results  Component Value Date   CHOL 169 11/02/2023   TRIG 70 11/02/2023   HDL 42 11/02/2023   CHOLHDL 4.0 11/02/2023   VLDL 14 11/02/2023   LDLCALC 113 (H) 11/02/2023   LDLCALC 122 (H) 08/30/2023   Physical Findings: AIMS:  ,  ,  ,  ,  ,  ,   CIWA:    COWS:     Musculoskeletal: Strength & Muscle Tone: within normal limits Gait & Station: normal Patient leans: N/A  Psychiatric Specialty Exam:  Presentation  General Appearance:  Casual; Fairly Groomed  Eye Contact: Good  Speech: Clear and Coherent; Normal Rate  Speech Volume: Normal  Handedness: Right   Mood and Affect  Mood: Anxious; Depressed  Affect: Congruent   Thought Process  Thought Processes: Coherent  Descriptions of Associations:Intact  Orientation:Full (Time, Place and Person)  Thought Content:Logical  History of Schizophrenia/Schizoaffective disorder:No  Duration of Psychotic Symptoms:No data recorded Hallucinations:Hallucinations: None  Ideas of Reference:None  Suicidal Thoughts:Suicidal Thoughts: No  Homicidal Thoughts:Homicidal Thoughts: No   Sensorium  Memory: Immediate Good; Recent Good; Remote Good  Judgment: Fair  Insight: Fair   Art therapist  Concentration: Good  Attention Span: Good  Recall: Good  Fund of Knowledge: Fair  Language: Good  Psychomotor Activity  Psychomotor Activity: Psychomotor Activity: Normal  Assets  Assets: Communication Skills; Desire for Improvement; Housing; Health and safety inspector; Social Support  Sleep  Sleep: Sleep: Good Number of Hours of Sleep: 7.5  Physical Exam: Physical Exam Vitals and nursing note reviewed.    Review of Systems  Constitutional:  Negative for chills and fever.  Gastrointestinal:  Positive for nausea and vomiting.   Blood pressure (!) 125/93, pulse 73, temperature 98.4  F (36.9 C), temperature source Oral, resp. rate 19, height 5' 4 (1.626 m), weight 72.4 kg, SpO2 100%. Body mass index is 27.4 kg/m.  Treatment Plan Summary: Daily contact with patient to assess and evaluate symptoms and progress in treatment and Medication management.   Principal/active diagnoses.  MDD (major depressive disorder), recurrent severe, without psychosis. PTSD (post-traumatic stress disorder) MDD (major depressive disorder)  Plan: The risks/benefits/side-effects/alternatives to the medications in use were discussed in detail with the patient and time was given for patient's questions. The patient consents to medication trial.    Patient requested to be resumed on all her mental health medications. Says she is here to learn the coping skills she needs to deal with her past. And because she states that she does not usually take her medications on regular basis. Her Abilify  was gradually resumed. See the [facility].    -Abilify  5 mg po today 11-04-23 once for mood control.  -Abilify  10 mg po daily once tomorrow 11-05-23 for mood control.  -Continue Abilify  15 mg  po daily starting 11-06-23 for mood control.  -Continue Sertraline  100 mg po daily for depression.  -Continue Trazodone  50 mg po Q hs prn for insomnia.  -Continue Hydroxyzine  25 mg po tid prn for anxiety.    Agitation protocols.  -Continue as recommended.    Other medical issues.  -Continue Protonix  40 mg po bid for GERD.  -Continue Albuterol  inhaler 2 puff Q 6 hrs prn for SOB.  -Continue Symbicort  2 puffs bid for SOB.   Other PRNS -Continue Tylenol  650 mg every 6 hours PRN for mild pain -Continue Maalox 30 ml Q 4 hrs PRN for indigestion -Continue MOM 30 ml po Q 6 hrs for constipation   Safety and Monitoring: Voluntary admission to inpatient psychiatric unit for safety, stabilization and treatment Daily contact with patient to assess and evaluate symptoms and progress in treatment Patient's case to be discussed in  multi-disciplinary team meeting Observation Level : q15 minute checks Vital signs: q12 hours Precautions: Safety   Discharge Planning: Social work and case management to assist with discharge planning and identification of hospital follow-up needs prior to discharge Estimated LOS: 5-7 days Discharge Concerns: Need to establish a safety plan; Medication compliance and effectiveness Discharge Goals: Return home with outpatient referrals for mental health follow-up including medication management/psychotherapy  Mac Bolster, NP, pmhnp, fnp-bc. 11/05/2023, 2:45 PM

## 2023-11-05 NOTE — ED Notes (Signed)
 Pt complaining of hand cramping. Crying. Provider at bedside

## 2023-11-05 NOTE — Progress Notes (Signed)
   11/05/23 1000  Psych Admission Type (Psych Patients Only)  Admission Status Involuntary  Psychosocial Assessment  Patient Complaints None  Eye Contact Fair  Facial Expression Flat  Affect Appropriate to circumstance  Speech Logical/coherent  Interaction Assertive  Motor Activity Slow  Appearance/Hygiene Disheveled  Behavior Characteristics Appropriate to situation  Mood Preoccupied  Thought Process  Coherency WDL  Content WDL  Delusions None reported or observed  Perception WDL  Hallucination None reported or observed  Judgment Poor  Confusion None  Danger to Self  Current suicidal ideation? Denies  Agreement Not to Harm Self Yes  Description of Agreement verval  Danger to Others  Danger to Others None reported or observed

## 2023-11-05 NOTE — Group Note (Signed)
 [facility] LCSW Group Therapy Note  @TD @  10:00-11:00AM Type of Therapy and Topic:  Group Therapy:  Safety Participation Level:  Did Not Attend   Description of Group:  Patients in this group were introduced the patient's safety;  the treatment  revolves around one central idea; you need to stay safe. The patient will learn on how to cope safely, no matter what negative life events come their way. The patient will identify methods on how they cope safely vs unsafe.  The patient will understand the three stages of healing: Safety, Mourning and Reconnection.  Patients discussed what additional healthy supports could be helpful in their recovery and wellness after discharge in order to prevent future hospitalizations.   An emphasis was placed on following up with the discharge plan when they leave the hospital to continue becoming healthier and happier.   Therapeutic Goals: 1)  Understand the patient's safety  2)  Stages of Safety  3)  Safe Vs. Unsafe   4) Support     Mckenzie Toruno O Zia Najera, LCSWA 11/05/2023  2:52 PM

## 2023-11-06 ENCOUNTER — Inpatient Hospital Stay (HOSPITAL_COMMUNITY): Payer: MEDICAID

## 2023-11-06 DIAGNOSIS — F332 Major depressive disorder, recurrent severe without psychotic features: Principal | ICD-10-CM

## 2023-11-06 DIAGNOSIS — F4310 Post-traumatic stress disorder, unspecified: Secondary | ICD-10-CM

## 2023-11-06 LAB — URINALYSIS, W/ REFLEX TO CULTURE (INFECTION SUSPECTED)
Bacteria, UA: NONE SEEN
Bilirubin Urine: NEGATIVE
Glucose, UA: NEGATIVE mg/dL
Hgb urine dipstick: NEGATIVE
Ketones, ur: 20 mg/dL — AB
Nitrite: NEGATIVE
Protein, ur: NEGATIVE mg/dL
Specific Gravity, Urine: 1.026 (ref 1.005–1.030)
pH: 6 (ref 5.0–8.0)

## 2023-11-06 LAB — LIPASE, BLOOD: Lipase: 32 U/L (ref 11–51)

## 2023-11-06 LAB — COMPREHENSIVE METABOLIC PANEL WITH GFR
ALT: 13 U/L (ref 0–44)
AST: 19 U/L (ref 15–41)
Albumin: 4.2 g/dL (ref 3.5–5.0)
Alkaline Phosphatase: 107 U/L (ref 38–126)
Anion gap: 11 (ref 5–15)
BUN: 17 mg/dL (ref 6–20)
CO2: 23 mmol/L (ref 22–32)
Calcium: 9.4 mg/dL (ref 8.9–10.3)
Chloride: 100 mmol/L (ref 98–111)
Creatinine, Ser: 0.66 mg/dL (ref 0.44–1.00)
GFR, Estimated: >60 mL/min (ref >60)
Glucose, Bld: 72 mg/dL (ref 70–99)
Potassium: 3.3 mmol/L — ABNORMAL LOW (ref 3.5–5.1)
Sodium: 134 mmol/L — ABNORMAL LOW (ref 135–145)
Total Bilirubin: 0.8 mg/dL (ref 0.0–1.2)
Total Protein: 8.6 g/dL — ABNORMAL HIGH (ref 6.5–8.1)

## 2023-11-06 LAB — CBC WITH DIFFERENTIAL/PLATELET
Abs Immature Granulocytes: 0.02 10*3/uL (ref 0.00–0.07)
Basophils Absolute: 0 10*3/uL (ref 0.0–0.1)
Basophils Relative: 0 %
Eosinophils Absolute: 0 10*3/uL (ref 0.0–0.5)
Eosinophils Relative: 0 %
HCT: 36.2 % (ref 36.0–46.0)
Hemoglobin: 12 g/dL (ref 12.0–15.0)
Immature Granulocytes: 0 %
Lymphocytes Relative: 34 %
Lymphs Abs: 3.1 10*3/uL (ref 0.7–4.0)
MCH: 29.9 pg (ref 26.0–34.0)
MCHC: 33.1 g/dL (ref 30.0–36.0)
MCV: 90 fL (ref 80.0–100.0)
Monocytes Absolute: 0.7 10*3/uL (ref 0.1–1.0)
Monocytes Relative: 8 %
Neutro Abs: 5.3 10*3/uL (ref 1.7–7.7)
Neutrophils Relative %: 58 %
Platelets: 376 10*3/uL (ref 150–400)
RBC: 4.02 MIL/uL (ref 3.87–5.11)
RDW: 11.9 % (ref 11.5–15.5)
WBC: 9.1 10*3/uL (ref 4.0–10.5)
nRBC: 0 % (ref 0.0–0.2)

## 2023-11-06 LAB — HCG, SERUM, QUALITATIVE: Preg, Serum: NEGATIVE

## 2023-11-06 MED ORDER — ALUM & MAG HYDROXIDE-SIMETH 200-200-20 MG/5ML PO SUSP
30.0000 mL | Freq: Once | ORAL | Status: DC
  Filled 2023-11-06: qty 30

## 2023-11-06 MED ORDER — LISINOPRIL 5 MG PO TABS
2.5000 mg | ORAL_TABLET | Freq: Every day | ORAL | Status: DC
  Administered 2023-11-06: 2.5 mg via ORAL
  Filled 2023-11-06 (×3): qty 1

## 2023-11-06 MED ORDER — WHITE PETROLATUM EX OINT
TOPICAL_OINTMENT | CUTANEOUS | Status: AC
  Administered 2023-11-06: 1
  Filled 2023-11-06: qty 5

## 2023-11-06 MED ORDER — IOHEXOL 300 MG/ML  SOLN
100.0000 mL | Freq: Once | INTRAMUSCULAR | Status: AC | PRN
  Administered 2023-11-06: 100 mL via INTRAVENOUS

## 2023-11-06 MED ORDER — LIDOCAINE VISCOUS HCL 2 % MT SOLN
15.0000 mL | Freq: Once | OROMUCOSAL | Status: DC
  Filled 2023-11-06: qty 15

## 2023-11-06 MED ORDER — CALCIUM CARBONATE ANTACID 500 MG PO CHEW
1.0000 | CHEWABLE_TABLET | Freq: Three times a day (TID) | ORAL | Status: DC
  Administered 2023-11-06 – 2023-11-09 (×9): 200 mg via ORAL
  Filled 2023-11-06 (×9): qty 1

## 2023-11-06 MED ORDER — DROPERIDOL 2.5 MG/ML IJ SOLN
2.5000 mg | Freq: Once | INTRAMUSCULAR | Status: AC
  Administered 2023-11-06: 2.5 mg via INTRAVENOUS
  Filled 2023-11-06: qty 2

## 2023-11-06 NOTE — ED Notes (Signed)
 Pt arrived from [facility] under IVC with c/o RLQ pain, N/V since yesterday, diarrhea that started today.

## 2023-11-06 NOTE — Progress Notes (Signed)
 Pt transferred to [facility] via [facility].  Report given

## 2023-11-06 NOTE — Progress Notes (Signed)
 [facility] MD Progress Note  11/06/2023 3:13 PM Amy Wall  MRN:  [id number]  Reason for admission: 18 year old AA female with hx of mental health issues. Admitted to the [facility] from the [facility] with complaint of suicidal ideations /thoughts of 3 weeks. Patient is currently under an IVC petition. She presented to the [facility] accompanied by her therapist, stating that she has not been herself lately. Pt also mentioned how she is having thoughts of not wanting to live. Pt mentions she has been feeling this way for 2-3 weeks and she finds herself not using any coping skills. Pt reports that she is afraid that she might end her life if she does not get help.   Daily notes: Amy Wall is seen in her room this morning. Chart reviewed. The chart findings discussed with the treatment team this afternoon during the team meeting. She presents alert & oriented. She is making a good eye contact. Patient was sent to the ED yesterday for medical evaluation for N/V she claimed was due to her hx of H. Pylori diagnosis. However, the staff reports indicated that she may be self-inducing the vomiting. She reports during this evaluation, I'm doing okay. I got back from the ED late evening yesterday. I slept okay. My stomach feels better this morning. When I got to the ED yesterday, I was given some medicine & was told that I was dehydrated. They gave me some IV fluids. That was it. I was sent back here. I have not had any nausea or vomiting this morning. The nursing reports this am indicated that patient has been irritable & demanding one thing or the other this morning. The staff reports period of intrusiveness, however, patient is redirectable. Prior to the treatment team meeting this afternoon, patient's nurse did ask for an order for Tums for indigestion as patient started complaining of spitting-up again. At this time, patient m,ay have have reached her baseline psychiatrically because all her complaints are now medical in nature. The  EDP notes yesterday's indicated that patient may be suffering from gastritis or this may as well be somatic complaint as it relates to her  mental illness. Started patient on a low dose of lisinopril due to elevated blood pressure.  Principal Problem: MDD (major depressive disorder), recurrent severe, without psychosis (HCC)  Diagnosis: Principal Problem:   MDD (major depressive disorder), recurrent severe, without psychosis (HCC) Active Problems:   PTSD (post-traumatic stress disorder)   Gastroesophageal reflux disease without esophagitis   MDD (major depressive disorder)  Total Time spent with patient: 35 minutes  Past Psychiatric History: See H&P.  Past Medical History:  Past Medical History:  Diagnosis Date   Anxiety    Asthma    DMDD (disruptive mood dysregulation disorder) (HCC)    Epilepsy (HCC)    pseuodoseizures per chart   HTN (hypertension)    Major depressive disorder    Oppositional defiant disorder    PTSD (post-traumatic stress disorder)    Schizophrenia (HCC)    Seizures (HCC)     Past Surgical History:  Procedure Laterality Date   BACK SURGERY     CHOLECYSTECTOMY, LAPAROSCOPIC     Family History:  Family History  Problem Relation Age of Onset   Allergic rhinitis Father    Eczema Sister    Allergic rhinitis Sister    Colon cancer Neg Hx    Rectal cancer Neg Hx    Stomach cancer Neg Hx    Esophageal cancer Neg Hx    Family  Psychiatric  History: See H&P.  Social History:  Social History   Substance and Sexual Activity  Alcohol Use Not Currently   Alcohol/week: 4.0 standard drinks of alcohol   Types: 2 Glasses of wine, 2 Shots of liquor per week     Social History   Substance and Sexual Activity  Drug Use Not Currently   Types: Marijuana, Methamphetamines   Comment: daily    Social History   Socioeconomic History   Marital status: Single    Spouse name: Not on file   Number of children: 0   Years of education: Not on file   Highest  education level: Not on file  Occupational History   Not on file  Tobacco Use   Smoking status: Every Day    Types: Cigars    Passive exposure: Yes   Smokeless tobacco: Never   Tobacco comments:    black and mild, marijuana  Vaping Use   Vaping status: Every Day  Substance and Sexual Activity   Alcohol use: Not Currently    Alcohol/week: 4.0 standard drinks of alcohol    Types: 2 Glasses of wine, 2 Shots of liquor per week   Drug use: Not Currently    Types: Marijuana, Methamphetamines    Comment: daily   Sexual activity: Not Currently    Birth control/protection: Condom, Injection  Other Topics Concern   Not on file  Social History Narrative   She lives with her uncle and aunt.   She has six siblings.   Social Drivers of Health   Financial Resource Strain: Medium Risk (06/28/2022)   Overall Financial Resource Strain (CARDIA)    Difficulty of Paying Living Expenses: Somewhat hard  Food Insecurity: Patient Declined (11/03/2023)   Hunger Vital Sign    Worried About Running Out of Food in the Last Year: Patient declined    Ran Out of Food in the Last Year: Patient declined  Transportation Needs: Patient Declined (11/03/2023)   PRAPARE - Administrator, Civil Service (Medical): Patient declined    Lack of Transportation (Non-Medical): Patient declined  Physical Activity: Inactive (06/28/2022)   Exercise Vital Sign    Days of Exercise per Week: 0 days    Minutes of Exercise per Session: 0 min  Stress: No Stress Concern Present (06/28/2022)   [employer] of Occupational Health - Occupational Stress Questionnaire    Feeling of Stress : Only a little  Social Connections: Patient Declined (11/03/2023)   Social Connection and Isolation Panel    Frequency of Communication with Friends and Family: Patient declined    Frequency of Social Gatherings with Friends and Family: Patient declined    Attends Religious Services: Patient declined    Database administrator or  Organizations: Patient declined    Attends Engineer, structural: Patient declined    Marital Status: Patient declined   Additional Social History:   Sleep: Good Estimated Sleeping Duration (Last 24 Hours): 12.25 hours  Appetite:  Fair  Current Medications: Current Facility-Administered Medications  Medication Dose Route Frequency Provider Last Rate Last Admin   acetaminophen  (TYLENOL ) tablet 650 mg  650 mg Oral Q6H PRN Hoang, Daniela B, MD   650 mg at 11/06/23 1307   albuterol  (VENTOLIN  HFA) 108 (90 Base) MCG/ACT inhaler 2 puff  2 puff Inhalation Q6H PRN Collene Gouge I, NP   2 puff at 11/04/23 2138   alum & mag hydroxide-simeth (MAALOX/MYLANTA) 200-200-20 MG/5ML suspension 30 mL  30 mL Oral Q4H PRN Hoang,  Daniela B, MD       ARIPiprazole  (ABILIFY ) tablet 15 mg  15 mg Oral Daily Lenee Franze I, NP   15 mg at 11/06/23 1002   calcium  carbonate (TUMS - dosed in mg elemental calcium ) chewable tablet 200 mg of elemental calcium   1 tablet Oral TID WC Johari Bennetts I, NP       haloperidol  (HALDOL ) tablet 5 mg  5 mg Oral TID PRN Hoang, Daniela B, MD       And   diphenhydrAMINE  (BENADRYL ) capsule 50 mg  50 mg Oral TID PRN Hoang, Daniela B, MD       haloperidol  lactate (HALDOL ) injection 5 mg  5 mg Intramuscular TID PRN Hoang, Daniela B, MD       And   diphenhydrAMINE  (BENADRYL ) injection 50 mg  50 mg Intramuscular TID PRN Hoang, Daniela B, MD       And   LORazepam  (ATIVAN ) injection 2 mg  2 mg Intramuscular TID PRN Hoang, Daniela B, MD       haloperidol  lactate (HALDOL ) injection 10 mg  10 mg Intramuscular TID PRN Hoang, Daniela B, MD       And   diphenhydrAMINE  (BENADRYL ) injection 50 mg  50 mg Intramuscular TID PRN Hoang, Daniela B, MD       And   LORazepam  (ATIVAN ) injection 2 mg  2 mg Intramuscular TID PRN Hoang, Daniela B, MD       feeding supplement (ENSURE PLUS HIGH PROTEIN) liquid 237 mL  237 mL Oral BID BM Trudy Carwin, NP   237 mL at 11/06/23 1005   fluticasone   furoate-vilanterol (BREO ELLIPTA ) 100-25 MCG/ACT 1 puff  1 puff Inhalation Daily Collene Gouge I, NP   1 puff at 11/06/23 1003   hydrOXYzine  (ATARAX ) tablet 25 mg  25 mg Oral TID PRN Hoang, Daniela B, MD   25 mg at 11/06/23 1101   magnesium  hydroxide (MILK OF MAGNESIA) suspension 30 mL  30 mL Oral Daily PRN Hoang, Daniela B, MD       nicotine  polacrilex (NICORETTE ) gum 2 mg  2 mg Oral PRN Trudy Carwin, NP       ondansetron  (ZOFRAN -ODT) disintegrating tablet 4 mg  4 mg Oral Q8H PRN Collene Gouge I, NP   4 mg at 11/06/23 1005   pantoprazole  (PROTONIX ) EC tablet 40 mg  40 mg Oral BID AC Fryda Molenda I, NP   40 mg at 11/06/23 9396   sertraline  (ZOLOFT ) tablet 100 mg  100 mg Oral Daily Annemarie Sebree I, NP   100 mg at 11/06/23 1002   traZODone  (DESYREL ) tablet 50 mg  50 mg Oral QHS PRN Hoang, Daniela B, MD   50 mg at 11/05/23 2131   Lab Results:  Results for orders placed or performed during the hospital encounter of 11/03/23 (from the past 48 hours)  Comprehensive metabolic panel     Status: Abnormal   Collection Time: 11/05/23  3:44 PM  Result Value Ref Range   Sodium 135 135 - 145 mmol/L   Potassium 3.5 3.5 - 5.1 mmol/L   Chloride 99 98 - 111 mmol/L   CO2 22 22 - 32 mmol/L   Glucose, Bld 86 70 - 99 mg/dL    Comment: Glucose reference range applies only to samples taken after fasting for at least 8 hours.   BUN 18 6 - 20 mg/dL   Creatinine, Ser 9.44 0.44 - 1.00 mg/dL   Calcium  9.8 8.9 - 10.3 mg/dL   Total Protein  9.2 (H) 6.5 - 8.1 g/dL   Albumin 4.4 3.5 - 5.0 g/dL   AST 23 15 - 41 U/L   ALT 14 0 - 44 U/L   Alkaline Phosphatase 113 38 - 126 U/L   Total Bilirubin 0.7 0.0 - 1.2 mg/dL   GFR, Estimated >39 >39 mL/min    Comment: (NOTE) Calculated using the CKD-EPI Creatinine Equation (2021)    Anion gap 14 5 - 15    Comment: Performed at [facility], [address], [place], [place] [address]  CBC with Differential     Status: Abnormal   Collection Time: 11/05/23  3:44 PM   Result Value Ref Range   WBC 8.2 4.0 - 10.5 K/uL   RBC 4.49 3.87 - 5.11 MIL/uL   Hemoglobin 13.3 12.0 - 15.0 g/dL   HCT 60.1 63.9 - 53.9 %   MCV 88.6 80.0 - 100.0 fL   MCH 29.6 26.0 - 34.0 pg   MCHC 33.4 30.0 - 36.0 g/dL   RDW 88.0 88.4 - 84.4 %   Platelets 401 (H) 150 - 400 K/uL   nRBC 0.0 0.0 - 0.2 %   Neutrophils Relative % 70 %   Neutro Abs 5.8 1.7 - 7.7 K/uL   Lymphocytes Relative 24 %   Lymphs Abs 1.9 0.7 - 4.0 K/uL   Monocytes Relative 6 %   Monocytes Absolute 0.5 0.1 - 1.0 K/uL   Eosinophils Relative 0 %   Eosinophils Absolute 0.0 0.0 - 0.5 K/uL   Basophils Relative 0 %   Basophils Absolute 0.0 0.0 - 0.1 K/uL   Immature Granulocytes 0 %   Abs Immature Granulocytes 0.02 0.00 - 0.07 K/uL    Comment: Performed at [facility], [address], [place], [place] [address]  Lipase, blood     Status: None   Collection Time: 11/05/23  3:44 PM  Result Value Ref Range   Lipase 30 11 - 51 U/L    Comment: Performed at [facility], [address] [place], [place] [address]    Blood Alcohol level:  Lab Results  Component Value Date   [facility] <15 11/02/2023   ETH <10 06/21/2023    Metabolic Disorder Labs: Lab Results  Component Value Date   HGBA1C 4.8 11/02/2023   MPG 91.06 11/02/2023   MPG 100 09/26/2019   Lab Results  Component Value Date   PROLACTIN 13.8 11/03/2020   PROLACTIN 18.2 01/27/2017   Lab Results  Component Value Date   CHOL 169 11/02/2023   TRIG 70 11/02/2023   HDL 42 11/02/2023   CHOLHDL 4.0 11/02/2023   VLDL 14 11/02/2023   LDLCALC 113 (H) 11/02/2023   LDLCALC 122 (H) 08/30/2023   Physical Findings: AIMS:  ,  ,  ,  ,  ,  ,   CIWA:    COWS:     Musculoskeletal: Strength & Muscle Tone: within normal limits Gait & Station: normal Patient leans: N/A  Psychiatric Specialty Exam:  Presentation  General Appearance:  Casual; Fairly Groomed  Eye Contact: Good  Speech: Clear and Coherent; Normal  Rate  Speech Volume: Normal  Handedness: Right   Mood and Affect  Mood: Irritable  Affect: Congruent   Thought Process  Thought Processes: Coherent; Goal Directed  Descriptions of Associations:Intact  Orientation:Full (Time, Place and Person)  Thought Content:Logical  History of Schizophrenia/Schizoaffective disorder:No  Duration of Psychotic Symptoms:No data recorded Hallucinations:Hallucinations: None   Ideas of Reference:None  Suicidal Thoughts:Suicidal Thoughts: No  Homicidal Thoughts:Homicidal Thoughts: No    Sensorium  Memory: Immediate Good; Recent Good; Remote Good  Judgment: Fair  Insight: Lacking   Executive Functions  Concentration: Fair  Attention Span: Fair  Recall: Good  Fund of Knowledge: Fair  Language: Good  Psychomotor Activity  Psychomotor Activity: Psychomotor Activity: Normal   Assets  Assets: Communication Skills; Desire for Improvement; Housing; Social Support  Sleep  Sleep: Sleep: Good Number of Hours of Sleep: 9.75   Physical Exam: Physical Exam Vitals and nursing note reviewed.  HENT:     Head: Normocephalic.     Nose: Nose normal.     Mouth/Throat:     Pharynx: Oropharynx is clear.   Cardiovascular:     Pulses: Normal pulses.  Pulmonary:     Effort: Pulmonary effort is normal.  Genitourinary:    Comments: Deferred.  Musculoskeletal:        General: Normal range of motion.     Cervical back: Normal range of motion.   Skin:    General: Skin is dry.   Neurological:     General: No focal deficit present.     Mental Status: She is oriented to person, place, and time.    Review of Systems  Constitutional:  Negative for chills and fever.  HENT:  Negative for congestion and sore throat.   Respiratory:  Negative for cough, shortness of breath and wheezing.   Cardiovascular:  Negative for chest pain and palpitations.  Gastrointestinal:  Negative for constipation, nausea and vomiting.   Genitourinary:  Negative for dysuria.  Musculoskeletal:  Negative for joint pain and myalgias.  Neurological:  Negative for dizziness, tingling, tremors, sensory change, speech change, focal weakness, seizures, loss of consciousness, weakness and headaches.  Endo/Heme/Allergies:        See allergy lists.  Psychiatric/Behavioral:  Positive for substance abuse. Negative for hallucinations, memory loss and suicidal ideas. The patient is not nervous/anxious and does not have insomnia.    Blood pressure (!) 114/92, pulse 94, temperature 98.9 F (37.2 C), temperature source Oral, resp. rate 18, height 5' 4 (1.626 m), weight 72.4 kg, SpO2 100%. Body mass index is 27.4 kg/m.  Treatment Plan Summary: Daily contact with patient to assess and evaluate symptoms and progress in treatment and Medication management.   Principal/active diagnoses.  MDD (major depressive disorder), recurrent severe, without psychosis. PTSD (post-traumatic stress disorder) MDD (major depressive disorder)  Plan: The risks/benefits/side-effects/alternatives to the medications in use were discussed in detail with the patient and time was given for patient's questions. The patient consents to medication trial.    Patient requested to be resumed on all her mental health medications. Says she is here to learn the coping skills she needs to deal with her past. And because she states that she does not usually take her medications on regular basis. Her Abilify  was gradually resumed. See the [facility].    -Completed Abilify  5 mg po today 11-04-23 once for mood control.  -Completed Abilify  10 mg po daily once tomorrow 11-05-23 for mood control.  -Continue Abilify  15 mg po daily starting 11-06-23 for mood control.  -Continue Sertraline  100 mg po daily for depression.  -Continue Trazodone  50 mg po Q hs prn for insomnia.  -Continue Hydroxyzine  25 mg po tid prn for anxiety.    Agitation protocols.  -Continue as recommended.    Other  medical issues.  -Continue Protonix  40 mg po bid for GERD.  -Continue Albuterol  inhaler 2 puff Q 6 hrs prn for SOB.  -Continue  Symbicort  2 puffs bid for SOB.  -Tums 1 tablet po tid for indigestion.  -Lisinopril 5 mg po daily for HTN.   Other PRNS -Continue Tylenol  650 mg every 6 hours PRN for mild pain -Continue Maalox 30 ml Q 4 hrs PRN for indigestion -Continue MOM 30 ml po Q 6 hrs for constipation   Safety and Monitoring: Voluntary admission to inpatient psychiatric unit for safety, stabilization and treatment Daily contact with patient to assess and evaluate symptoms and progress in treatment Patient's case to be discussed in multi-disciplinary team meeting Observation Level : q15 minute checks Vital signs: q12 hours Precautions: Safety   Discharge Planning: Social work and case management to assist with discharge planning and identification of hospital follow-up needs prior to discharge Estimated LOS: 5-7 days Discharge Concerns: Need to establish a safety plan; Medication compliance and effectiveness Discharge Goals: Return home with outpatient referrals for mental health follow-up including medication management/psychotherapy  Mac Bolster, NP, pmhnp, fnp-bc. 11/06/2023, 3:13 PM Patient ID: Olden MARLA Minerva, female   DOB: 2006-04-21, 18 y.o.   MRN: [id number]

## 2023-11-06 NOTE — Progress Notes (Signed)
   11/06/23 1017  Psych Admission Type (Psych Patients Only)  Admission Status Involuntary  Psychosocial Assessment  Patient Complaints Anxiety;Insomnia;Irritability  Eye Contact Fair  Facial Expression Flat  Affect Anxious  Speech Logical/coherent  Interaction Assertive  Motor Activity Slow  Appearance/Hygiene Disheveled  Behavior Characteristics Cooperative  Mood Anxious  Thought Process  Coherency WDL  Content WDL  Delusions None reported or observed  Perception WDL  Hallucination None reported or observed  Judgment Poor  Confusion None  Danger to Self  Current suicidal ideation? Denies  Description of Suicide Plan none  Agreement Not to Harm Self Yes  Description of Agreement verbal  Danger to Others  Danger to Others None reported or observed

## 2023-11-06 NOTE — ED Notes (Signed)
 Unsuccessful IV attempt x2.

## 2023-11-06 NOTE — Progress Notes (Signed)
 Pt continues to complain of abd pain with N/V.  Report called to Consulting civil engineer at [facility].  Verbalized understanding.  [place] non emergency contacted for transport.

## 2023-11-06 NOTE — Progress Notes (Signed)
 Pt continues to complain of RLQ abd pain with nausea.  Pt was given prn medication as ordered.  She continues to be irritable with complaints of pain.  Provider Aggie NP aware.

## 2023-11-06 NOTE — Progress Notes (Signed)
 Pt awake and alert. Sitting quietly in room awaiting PTAR for transport.    Pt's Gaurdian Shelba Hummer notified of pt'scomplaint and pending transfer to [facility].SABRA  she asked that since Legal guardian  that she be included in the pt's care.   Pt will be accompanied by [facility]

## 2023-11-06 NOTE — ED Notes (Signed)
 Sheriff notified for pt transport back to [facility]

## 2023-11-06 NOTE — Plan of Care (Signed)
  Problem: Education: Goal: Knowledge of [facility] Education information/materials will improve Outcome: Not Progressing Goal: Emotional status will improve Outcome: Not Progressing   Problem: Coping: Goal: Ability to verbalize frustrations and anger appropriately will improve Outcome: Not Progressing Goal: Ability to demonstrate self-control will improve Outcome: Not Progressing
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
## 2023-11-07 MED ORDER — POTASSIUM CHLORIDE CRYS ER 20 MEQ PO TBCR
40.0000 meq | EXTENDED_RELEASE_TABLET | Freq: Once | ORAL | Status: DC
  Filled 2023-11-07: qty 2

## 2023-11-07 NOTE — Group Note (Signed)
 Recreation Therapy Group Note   Group Topic:Coping Skills  Group Date: 11/07/2023 Start Time: 1019 End Time: 1057 Facilitators: Khyron Garno-McCall, LRT,CTRS Location: [address] Dayroom   Group Topic: Coping Skills   Goal Area(s) Addresses: Patient will define what a coping skill is. Patient will successfully identify positive coping skills they can use post d/c.  Patient will acknowledge benefit(s) of using learned coping skills post d/c.  Behavioral Response: Engaged   Intervention: Worksheet, Music   Activity: Coping A to Z. Patient asked to identify what a coping skill is and when they use them. Patients with Clinical research associate discussed healthy versus unhealthy coping skills. Next patients were given a blank worksheet titled Coping Skills A-Z. Partners were instructed to come up with at least one positive coping skill per letter of the alphabet, addressing a specific challenge (ex: stress, anger, anxiety, depression, grief, doubt, isolation, self-harm/suicidal thoughts, substance use). Patients were given 15 minutes to brainstorm, before ideas were presented to the large group. Patients and LRT debriefed on the importance of coping skill selection based on situation and back-up plans when a skill tried is not effective. At the end of group, patients were given an handout of alphabetized strategies to keep for future reference.   Education: Pharmacologist, Scientist, physiological, Discharge Planning.    Education Outcome: Acknowledges education/Verbalizes understanding/In group clarification offered/Additional education needed   Affect/Mood: Appropriate   Participation Level: Engaged   Participation Quality: Independent   Behavior: Appropriate   Speech/Thought Process: Focused   Insight: Good   Judgement: Good   Modes of Intervention: Music and Worksheet   Patient Response to Interventions:  Engaged   Education Outcome:  In group clarification offered    Clinical  Observations/Individualized Feedback: Pt was engaged and interactive during group session. Pt had to come up with coping skills for anxiety. Pt did struggle at times coming up with her coping skills. Pt came up with some coping skills such as art, board games, cooking, deep breathing, finger painting, talk to somebody, quiet time, listen to rain sounds, reading, singing, nap and hot bath. Pt also confronted peer who was being rude by interrupting another peer who was singing. Pt became somewhat frustrated when peer made excuses and couldn't acknowledge his wrong behavior.    Plan: Continue to engage patient in RT group sessions 2-3x/week.   Suprina Mandeville-McCall, LRT,CTRS 11/07/2023 1:41 PM

## 2023-11-07 NOTE — Plan of Care (Signed)
   Problem: Education: Goal: Emotional status will improve Outcome: Progressing Goal: Mental status will improve Outcome: Progressing

## 2023-11-07 NOTE — Group Note (Signed)
 Date:  11/07/2023 Time:  8:42 PM  Group Topic/Focus:  Wrap-Up Group:   The focus of this group is to help patients review their daily goal of treatment and discuss progress on daily workbooks. Alcoholics Anonymous (AA) Meeting    Participation Level:  Did Not Attend  Participation Quality:  N/A  Affect:  N/A  Cognitive:  N/A  Insight: None  Engagement in Group:  N/A  Modes of Intervention:  N/A  Additional Comments:  Patient did not attended AA  Eward Mace 11/07/2023, 8:42 PM

## 2023-11-07 NOTE — Progress Notes (Signed)

## 2023-11-07 NOTE — Plan of Care (Signed)
   Problem: Education: Goal: Knowledge of [facility] [facility] Education information/materials will improve Outcome: Progressing Goal: Emotional status will improve Outcome: Progressing Goal: Mental status will improve Outcome: Progressing

## 2023-11-07 NOTE — Progress Notes (Signed)
 [facility] MD Progress Note  11/07/2023 6:46 PM Amy Wall  MRN:  [id number]  Reason for admission: 18 year old AA female with hx of mental health issues. Admitted to the [facility] from the [facility] with complaint of suicidal ideations /thoughts of 3 weeks. Patient is currently under an IVC petition. She presented to the [facility] accompanied by her therapist, stating that she has not been herself lately. Pt also mentioned how she is having thoughts of not wanting to live. Pt mentions she has been feeling this way for 2-3 weeks and she finds herself not using any coping skills. Pt reports that she is afraid that she might end her life if she does not get help.   Today's assessment notes: Amy Wall is seen and examined in the assessment room. Chart reviewed and findings shared with the treatment team and consult with attending psychiatrist. She presents alert, calm, cooperative, & oriented to person, time, place, and situation.  Patient reports, I went to the emergency room yesterday due to my H. pylori.  I was giving IV fluid and some medication and that seems to be helping. I have not had any nausea or vomiting since I return from the ED. She maintains good eye contact with this provider during this evaluation. The EDP notes yesterday's indicated that patient may be suffering from gastritis or this may as well be somatic complaint as it relates to her  mental illness. Patient continues on a low dose of lisinopril due to elevated blood pressure, blood pressure today within normal limits.  Patient tolerating p.o. intake well without any nausea or vomiting.  Observed CMP that values with potassium level 3.3, replaced with 40 mEq of potassium chloride  p.o.  Or other BMP in a.m.  Patient denies SI, HI, or AVH.  Observe attending and participating in therapeutic milieu and unit group activities.  We will continue to monitor for safety.  Patient was moved from room 502 to room 302 for lower level of care.  Principal Problem: MDD  (major depressive disorder), recurrent severe, without psychosis (HCC)  Diagnosis: Principal Problem:   MDD (major depressive disorder), recurrent severe, without psychosis (HCC) Active Problems:   PTSD (post-traumatic stress disorder)   Gastroesophageal reflux disease without esophagitis   MDD (major depressive disorder)  Total Time spent with patient: 45 minutes  Past Psychiatric History: See H&P.  Past Medical History:  Past Medical History:  Diagnosis Date   Anxiety    Asthma    DMDD (disruptive mood dysregulation disorder) (HCC)    Epilepsy (HCC)    pseuodoseizures per chart   HTN (hypertension)    Major depressive disorder    Oppositional defiant disorder    PTSD (post-traumatic stress disorder)    Schizophrenia (HCC)    Seizures (HCC)     Past Surgical History:  Procedure Laterality Date   BACK SURGERY     CHOLECYSTECTOMY, LAPAROSCOPIC     Family History:  Family History  Problem Relation Age of Onset   Allergic rhinitis Father    Eczema Sister    Allergic rhinitis Sister    Colon cancer Neg Hx    Rectal cancer Neg Hx    Stomach cancer Neg Hx    Esophageal cancer Neg Hx    Family Psychiatric  History: See H&P.  Social History:  Social History   Substance and Sexual Activity  Alcohol Use Not Currently   Alcohol/week: 4.0 standard drinks of alcohol   Types: 2 Glasses of wine, 2 Shots of liquor per  week     Social History   Substance and Sexual Activity  Drug Use Not Currently   Types: Marijuana, Methamphetamines   Comment: daily    Social History   Socioeconomic History   Marital status: Single    Spouse name: Not on file   Number of children: 0   Years of education: Not on file   Highest education level: Not on file  Occupational History   Not on file  Tobacco Use   Smoking status: Every Day    Types: Cigars    Passive exposure: Yes   Smokeless tobacco: Never   Tobacco comments:    black and mild, marijuana  Vaping Use   Vaping  status: Every Day  Substance and Sexual Activity   Alcohol use: Not Currently    Alcohol/week: 4.0 standard drinks of alcohol    Types: 2 Glasses of wine, 2 Shots of liquor per week   Drug use: Not Currently    Types: Marijuana, Methamphetamines    Comment: daily   Sexual activity: Not Currently    Birth control/protection: Condom, Injection  Other Topics Concern   Not on file  Social History Narrative   She lives with her uncle and aunt.   She has six siblings.   Social Drivers of Health   Financial Resource Strain: Medium Risk (06/28/2022)   Overall Financial Resource Strain (CARDIA)    Difficulty of Paying Living Expenses: Somewhat hard  Food Insecurity: Patient Declined (11/07/2023)   Hunger Vital Sign    Worried About Running Out of Food in the Last Year: Patient declined    Ran Out of Food in the Last Year: Patient declined  Transportation Needs: Patient Declined (11/07/2023)   PRAPARE - Administrator, Civil Service (Medical): Patient declined    Lack of Transportation (Non-Medical): Patient declined  Physical Activity: Inactive (06/28/2022)   Exercise Vital Sign    Days of Exercise per Week: 0 days    Minutes of Exercise per Session: 0 min  Stress: No Stress Concern Present (06/28/2022)   [employer] of Occupational Health - Occupational Stress Questionnaire    Feeling of Stress : Only a little  Social Connections: Patient Declined (11/03/2023)   Social Connection and Isolation Panel    Frequency of Communication with Friends and Family: Patient declined    Frequency of Social Gatherings with Friends and Family: Patient declined    Attends Religious Services: Patient declined    Database administrator or Organizations: Patient declined    Attends Engineer, structural: Patient declined    Marital Status: Patient declined   Additional Social History:   Sleep: Good Estimated Sleeping Duration (Last 24 Hours): 6.00-6.25 hours  Appetite:   Fair  Current Medications: Current Facility-Administered Medications  Medication Dose Route Frequency Provider Last Rate Last Admin   acetaminophen  (TYLENOL ) tablet 650 mg  650 mg Oral Q6H PRN Hoang, Daniela B, MD   650 mg at 11/07/23 1144   albuterol  (VENTOLIN  HFA) 108 (90 Base) MCG/ACT inhaler 2 puff  2 puff Inhalation Q6H PRN Collene Gouge I, NP   2 puff at 11/07/23 1405   alum & mag hydroxide-simeth (MAALOX/MYLANTA) 200-200-20 MG/5ML suspension 30 mL  30 mL Oral Q4H PRN Hoang, Daniela B, MD       ARIPiprazole  (ABILIFY ) tablet 15 mg  15 mg Oral Daily Nwoko, Gouge I, NP   15 mg at 11/07/23 0755   calcium  carbonate (TUMS - dosed in mg elemental calcium ) chewable  tablet 200 mg of elemental calcium   1 tablet Oral TID WC Nwoko, Agnes I, NP   200 mg of elemental calcium  at 11/07/23 1610   haloperidol  (HALDOL ) tablet 5 mg  5 mg Oral TID PRN Hoang, Daniela B, MD   5 mg at 11/07/23 1404   And   diphenhydrAMINE  (BENADRYL ) capsule 50 mg  50 mg Oral TID PRN Hoang, Daniela B, MD   50 mg at 11/07/23 1404   haloperidol  lactate (HALDOL ) injection 5 mg  5 mg Intramuscular TID PRN Hoang, Daniela B, MD       And   diphenhydrAMINE  (BENADRYL ) injection 50 mg  50 mg Intramuscular TID PRN Hoang, Daniela B, MD       And   LORazepam  (ATIVAN ) injection 2 mg  2 mg Intramuscular TID PRN Hoang, Daniela B, MD       haloperidol  lactate (HALDOL ) injection 10 mg  10 mg Intramuscular TID PRN Hoang, Daniela B, MD       And   diphenhydrAMINE  (BENADRYL ) injection 50 mg  50 mg Intramuscular TID PRN Hoang, Daniela B, MD       And   LORazepam  (ATIVAN ) injection 2 mg  2 mg Intramuscular TID PRN Hoang, Daniela B, MD       feeding supplement (ENSURE PLUS HIGH PROTEIN) liquid 237 mL  237 mL Oral BID BM Trudy Carwin, NP   237 mL at 11/07/23 1431   fluticasone  furoate-vilanterol (BREO ELLIPTA ) 100-25 MCG/ACT 1 puff  1 puff Inhalation Daily Collene Gouge I, NP   1 puff at 11/07/23 1146   hydrOXYzine  (ATARAX ) tablet 25 mg  25 mg Oral  TID PRN Hoang, Daniela B, MD   25 mg at 11/07/23 1404   magnesium  hydroxide (MILK OF MAGNESIA) suspension 30 mL  30 mL Oral Daily PRN Hoang, Daniela B, MD       nicotine  polacrilex (NICORETTE ) gum 2 mg  2 mg Oral PRN Trudy Carwin, NP       ondansetron  (ZOFRAN -ODT) disintegrating tablet 4 mg  4 mg Oral Q8H PRN Collene Gouge I, NP   4 mg at 11/06/23 1636   pantoprazole  (PROTONIX ) EC tablet 40 mg  40 mg Oral BID AC Nwoko, Agnes I, NP   40 mg at 11/07/23 1610   sertraline  (ZOLOFT ) tablet 100 mg  100 mg Oral Daily Nwoko, Agnes I, NP   100 mg at 11/07/23 0755   traZODone  (DESYREL ) tablet 50 mg  50 mg Oral QHS PRN Hoang, Daniela B, MD   50 mg at 11/06/23 2325   Lab Results:  Results for orders placed or performed during the hospital encounter of 11/03/23 (from the past 48 hours)  Comprehensive metabolic panel     Status: Abnormal   Collection Time: 11/06/23  8:21 PM  Result Value Ref Range   Sodium 134 (L) 135 - 145 mmol/L   Potassium 3.3 (L) 3.5 - 5.1 mmol/L   Chloride 100 98 - 111 mmol/L   CO2 23 22 - 32 mmol/L   Glucose, Bld 72 70 - 99 mg/dL    Comment: Glucose reference range applies only to samples taken after fasting for at least 8 hours.   BUN 17 6 - 20 mg/dL   Creatinine, Ser 9.33 0.44 - 1.00 mg/dL   Calcium  9.4 8.9 - 10.3 mg/dL   Total Protein 8.6 (H) 6.5 - 8.1 g/dL   Albumin 4.2 3.5 - 5.0 g/dL   AST 19 15 - 41 U/L   ALT 13 0 -  44 U/L   Alkaline Phosphatase 107 38 - 126 U/L   Total Bilirubin 0.8 0.0 - 1.2 mg/dL   GFR, Estimated >39 >39 mL/min    Comment: (NOTE) Calculated using the CKD-EPI Creatinine Equation (2021)    Anion gap 11 5 - 15    Comment: Performed at [facility], [address] [place], [place] [address]  CBC with Differential     Status: None   Collection Time: 11/06/23  8:21 PM  Result Value Ref Range   WBC 9.1 4.0 - 10.5 K/uL   RBC 4.02 3.87 - 5.11 MIL/uL   Hemoglobin 12.0 12.0 - 15.0 g/dL   HCT 63.7 63.9 - 53.9 %   MCV 90.0 80.0 - 100.0  fL   MCH 29.9 26.0 - 34.0 pg   MCHC 33.1 30.0 - 36.0 g/dL   RDW 88.0 88.4 - 84.4 %   Platelets 376 150 - 400 K/uL   nRBC 0.0 0.0 - 0.2 %   Neutrophils Relative % 58 %   Neutro Abs 5.3 1.7 - 7.7 K/uL   Lymphocytes Relative 34 %   Lymphs Abs 3.1 0.7 - 4.0 K/uL   Monocytes Relative 8 %   Monocytes Absolute 0.7 0.1 - 1.0 K/uL   Eosinophils Relative 0 %   Eosinophils Absolute 0.0 0.0 - 0.5 K/uL   Basophils Relative 0 %   Basophils Absolute 0.0 0.0 - 0.1 K/uL   Immature Granulocytes 0 %   Abs Immature Granulocytes 0.02 0.00 - 0.07 K/uL    Comment: Performed at [facility], [address], [place], [place] [address]  Lipase, blood     Status: None   Collection Time: 11/06/23  8:21 PM  Result Value Ref Range   Lipase 32 11 - 51 U/L    Comment: Performed at [facility], [address], [place], [place] [address]  hCG, serum, qualitative     Status: None   Collection Time: 11/06/23  8:21 PM  Result Value Ref Range   Preg, Serum NEGATIVE NEGATIVE    Comment:        THE SENSITIVITY OF THIS METHODOLOGY IS >10 mIU/mL. Performed at [facility], [address] [place], [place] [address]   Urinalysis, w/ Reflex to Culture (Infection Suspected) -Urine, Clean Catch     Status: Abnormal   Collection Time: 11/06/23  8:21 PM  Result Value Ref Range   Specimen Source URINE, CLEAN CATCH    Color, Urine YELLOW YELLOW   APPearance CLEAR CLEAR   Specific Gravity, Urine 1.026 1.005 - 1.030   pH 6.0 5.0 - 8.0   Glucose, UA NEGATIVE NEGATIVE mg/dL   Hgb urine dipstick NEGATIVE NEGATIVE   Bilirubin Urine NEGATIVE NEGATIVE   Ketones, ur 20 (A) NEGATIVE mg/dL   Protein, ur NEGATIVE NEGATIVE mg/dL   Nitrite NEGATIVE NEGATIVE   Leukocytes,Ua MODERATE (A) NEGATIVE   RBC / HPF 0-5 0 - 5 RBC/hpf   WBC, UA 21-50 0 - 5 WBC/hpf    Comment:        Reflex urine culture not performed if WBC <=10, OR if Squamous epithelial cells >5. If Squamous  epithelial cells >5 suggest recollection.    Bacteria, UA NONE SEEN NONE SEEN   Squamous Epithelial / HPF 0-5 0 - 5 /HPF   Mucus PRESENT     Comment: Performed at [facility], [address] [place], [place] [address]   Blood Alcohol level:  Lab Results  Component Value Date   [facility] <15 11/02/2023   ETH <10 06/21/2023   Metabolic Disorder Labs: Lab Results  Component Value Date   HGBA1C 4.8 11/02/2023   MPG 91.06 11/02/2023   MPG 100 09/26/2019   Lab Results  Component Value Date   PROLACTIN 13.8 11/03/2020   PROLACTIN 18.2 01/27/2017   Lab Results  Component Value Date   CHOL 169 11/02/2023   TRIG 70 11/02/2023   HDL 42 11/02/2023   CHOLHDL 4.0 11/02/2023   VLDL 14 11/02/2023   LDLCALC 113 (H) 11/02/2023   LDLCALC 122 (H) 08/30/2023   Physical Findings: AIMS:  ,  ,  ,  ,  ,  ,   CIWA:    COWS:     Musculoskeletal: Strength & Muscle Tone: within normal limits Gait & Station: normal Patient leans: N/A  Psychiatric Specialty Exam:  Presentation  General Appearance:  Casual; Fairly Groomed  Eye Contact: Good  Speech: Clear and Coherent  Speech Volume: Normal  Handedness: Right  Mood and Affect  Mood: Euthymic  Affect: Appropriate; Congruent  Thought Process  Thought Processes: Coherent; Goal Directed  Descriptions of Associations:Intact  Orientation:Full (Time, Place and Person)  Thought Content:Logical  History of Schizophrenia/Schizoaffective disorder:No  Duration of Psychotic Symptoms:No data recorded Hallucinations:Hallucinations: None  Ideas of Reference:None  Suicidal Thoughts:Suicidal Thoughts: No SI Active Intent and/or Plan: -- (Denies)   Homicidal Thoughts:Homicidal Thoughts: No  Sensorium  Memory: Immediate Good; Recent Good  Judgment: Fair  Insight: Fair  Art therapist  Concentration: Good  Attention Span: Good  Recall: Fair  Fund of  Knowledge: Fair  Language: Good  Psychomotor Activity  Psychomotor Activity: Psychomotor Activity: Normal  Assets  Assets: Communication Skills; Desire for Improvement; Physical Health; Resilience  Sleep  Sleep: Sleep: Good Number of Hours of Sleep: 10.5  Physical Exam: Physical Exam Vitals and nursing note reviewed.  Constitutional:      Appearance: She is normal weight.  HENT:     Head: Normocephalic.     Nose: Nose normal.     Mouth/Throat:     Mouth: Mucous membranes are moist.     Pharynx: Oropharynx is clear.   Eyes:     Extraocular Movements: Extraocular movements intact.    Cardiovascular:     Rate and Rhythm: Normal rate.     Pulses: Normal pulses.  Pulmonary:     Effort: Pulmonary effort is normal. No respiratory distress.  Abdominal:     Comments: Deferred  Genitourinary:    Comments: Deferred.  Musculoskeletal:        General: Normal range of motion.     Cervical back: Normal range of motion.   Skin:    General: Skin is warm.   Neurological:     General: No focal deficit present.     Mental Status: She is alert and oriented to person, place, and time.   Psychiatric:        Mood and Affect: Mood normal.        Behavior: Behavior normal.    Review of Systems  Constitutional:  Negative for chills and fever.  HENT:  Negative for congestion and sore throat.   Respiratory:  Negative for cough, shortness of breath and wheezing.   Cardiovascular:  Negative for chest pain and palpitations.  Gastrointestinal:  Negative for constipation, nausea and vomiting.  Genitourinary:  Negative for dysuria.  Musculoskeletal:  Negative for joint pain and myalgias.  Neurological:  Negative for dizziness, tingling, tremors, sensory change, speech change,  focal weakness, seizures, loss of consciousness, weakness and headaches.  Endo/Heme/Allergies:        See allergy lists.  Psychiatric/Behavioral:  Negative for hallucinations, memory loss, substance abuse  and suicidal ideas. The patient is not nervous/anxious and does not have insomnia.    Blood pressure 108/68, pulse 75, temperature 98.8 F (37.1 C), temperature source Oral, resp. rate 16, height 5' 4 (1.626 m), weight 72.4 kg, SpO2 100%. Body mass index is 27.4 kg/m.  Treatment Plan Summary: Daily contact with patient to assess and evaluate symptoms and progress in treatment and Medication management.   Principal/active diagnoses.  MDD (major depressive disorder), recurrent severe, without psychosis. PTSD (post-traumatic stress disorder) MDD (major depressive disorder)  Plan: The risks/benefits/side-effects/alternatives to the medications in use were discussed in detail with the patient and time was given for patient's questions. The patient consents to medication trial.    Patient requested to be resumed on all her mental health medications. Says she is here to learn the coping skills she needs to deal with her past. And because she states that she does not usually take her medications on regular basis. Her Abilify  was gradually resumed. See the [facility].    -Completed Abilify  5 mg po today 11-04-23 once for mood control.  -Completed Abilify  10 mg po daily once tomorrow 11-05-23 for mood control.  -Continue Abilify  15 mg po daily starting 11-06-23 for mood control.  -Continue Sertraline  100 mg po daily for depression.  -Continue Trazodone  50 mg po Q hs prn for insomnia.  -Continue Hydroxyzine  25 mg po tid prn for anxiety.  --Initiate potassium chloride  40 mEq p.o. x 1 only for potassium level of 3.3 --BMP in a.m. 11/08/2023  Agitation protocols.  -Continue as recommended.    Other medical issues.  -Continue Protonix  40 mg po bid for GERD.  -Continue Albuterol  inhaler 2 puff Q 6 hrs prn for SOB.  -Continue Symbicort  2 puffs bid for SOB.  -Tums 1 tablet po tid for indigestion.  -Lisinopril 5 mg po daily for HTN.   Other PRNS -Continue Tylenol  650 mg every 6 hours PRN for mild  pain -Continue Maalox 30 ml Q 4 hrs PRN for indigestion -Continue MOM 30 ml po Q 6 hrs for constipation   Safety and Monitoring: Voluntary admission to inpatient psychiatric unit for safety, stabilization and treatment Daily contact with patient to assess and evaluate symptoms and progress in treatment Patient's case to be discussed in multi-disciplinary team meeting Observation Level : q15 minute checks Vital signs: q12 hours Precautions: Safety   Discharge Planning: Social work and case management to assist with discharge planning and identification of hospital follow-up needs prior to discharge Estimated LOS: 5-7 days Discharge Concerns: Need to establish a safety plan; Medication compliance and effectiveness Discharge Goals: Return home with outpatient referrals for mental health follow-up including medication management/psychotherapy  Ellouise JAYSON Azure, FNP. 11/07/2023, 6:46 PM Patient ID: Olden Amy Wall, female   DOB: [birth year], 18 y.o.   MRN: [id number] Patient ID: Amy Wall, female   DOB: 11/13/2005, 18 y.o.   MRN: [id number]

## 2023-11-07 NOTE — Group Note (Deleted)
 Date:  11/07/2023 Time:  8:13 PM  Group Topic/Focus:  Wrap-Up Group:   The focus of this group is to help patients review their daily goal of treatment and discuss progress on daily workbooks. Alcoholics Anonymous (AA) Meeting     Participation Level:  {BHH PARTICIPATION OZCZO:77735}  Participation Quality:  {BHH PARTICIPATION QUALITY:22265}  Affect:  {BHH AFFECT:22266}  Cognitive:  {BHH COGNITIVE:22267}  Insight: {BHH Insight2:20797}  Engagement in Group:  {BHH ENGAGEMENT IN HMNLE:77731}  Modes of Intervention:  {BHH MODES OF INTERVENTION:22269}  Additional Comments:  ***  Eward Mace 11/07/2023, 8:13 PM
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
## 2023-11-07 NOTE — Group Note (Signed)
 LCSW Group Therapy Note   Group Date: 11/07/2023 Start Time: 1100 End Time: 1200   Participation:  patient was present and actively participated in the conversation  Objective:  To explore loneliness, boundaries, and safe ways to build relationships.  Goals: Recognize healthy vs. unhealthy relationships. Learn safe ways to connect with others. Strengthen communication and [employer].  Summary:  Participants discussed loneliness, healthy connections, and setting boundaries. They explored safe ways to meet people and shared personal experiences. Key insights were reinforced through discussion and quotes.  Therapeutic Modalities Used: Cognitive Behavioral Therapy (CBT) Elements - Identifying unhealthy relationship patterns, challenging negative thoughts about connection. Dialectical Behavior Therapy (DBT) Elements - Interpersonal effectiveness, setting and maintaining boundaries. Supportive Group Therapy - Peer discussion, shared experiences, and emotional validation.   Amy Wall, LCSWA 11/07/2023  5:43 PM

## 2023-11-07 NOTE — BH Assessment (Signed)
 Patient was up and around hall at beginning of shift. She ask for non-skid socks and was given yellow ones for high fall risk. Pt denies any N/V, SI/HI and AVH. Pt states she feels more comfortable over here on the 300 hall. Pt did not attend group tonight and when medication time came, she was resting with eyes closed in her bedroom. Pt refused to take Rx potassium, stating the pills were too big
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
## 2023-11-07 NOTE — Progress Notes (Signed)
 Recreation Therapy Notes  INPATIENT RECREATION THERAPY ASSESSMENT  Patient Details Name: Amy Wall MRN: [id number] DOB: April 10, 2006 Today's Date: 11/07/2023       Information Obtained From: Patient  Able to Participate in Assessment/Interview: Yes  Patient Presentation: Alert  Reason for Admission (Per Patient): Other (Comments) (Depression)  Patient Stressors: Other (Comment) (Life, family issues)  Coping Skills:   Isolation, Journal, Music, Exercise, Deep Breathing, Meditate, Substance Abuse, Talk, Art, Prayer, Avoidance, Read, Dance, Hot Bath/Shower  Leisure Interests (2+):  Individual - Other (Comment) (take a hot bath in epsom salt, eat a hot meal, watch movies and eat snacks.)  Frequency of Recreation/Participation: Weekly  Awareness of Community Resources:  Yes  Community Resources:  Tree surgeon, Other (Comment) (Various stores; [employer])  Current Use: Yes  If no, Barriers?:    Expressed Interest in [employer] Information: No  [employer] of Residence:  Engineer, technical sales  Patient Main Form of Transportation: Uber/Lyft  Patient Strengths:  Drawing, Engineer, site, Reading  Patient Identified Areas of Improvement:  Depression  Patient Goal for Hospitalization:  to be discharged to go home  Current SI (including self-harm):  No  Current HI:  No  Current AVH: No  Staff Intervention Plan: Group Attendance, Collaborate with Interdisciplinary Treatment Team  Consent to Intern Participation: N/A   Quinlee Sciarra-McCall, LRT,CTRS Damar Petit A Elam Ellis-McCall 11/07/2023, 3:24 PM

## 2023-11-08 LAB — BASIC METABOLIC PANEL WITH GFR
Anion gap: 14 (ref 5–15)
BUN: 12 mg/dL (ref 6–20)
CO2: 24 mmol/L (ref 22–32)
Calcium: 9.8 mg/dL (ref 8.9–10.3)
Chloride: 102 mmol/L (ref 98–111)
Creatinine, Ser: 0.67 mg/dL (ref 0.44–1.00)
GFR, Estimated: >60 mL/min (ref >60)
Glucose, Bld: 97 mg/dL (ref 70–99)
Potassium: 3.6 mmol/L (ref 3.5–5.1)
Sodium: 140 mmol/L (ref 135–145)

## 2023-11-08 LAB — URINE CULTURE: Culture: <10000 — AB

## 2023-11-08 NOTE — Plan of Care (Signed)
  Problem: Education: Goal: Emotional status will improve Outcome: Progressing Goal: Mental status will improve Outcome: Progressing   Problem: Activity: Goal: Interest or engagement in activities will improve Outcome: Progressing   Problem: Coping: Goal: Ability to verbalize frustrations and anger appropriately will improve Outcome: Progressing Goal: Ability to demonstrate self-control will improve Outcome: Progressing   Problem: Health Behavior/Discharge Planning: Goal: Compliance with treatment plan for underlying cause of condition will improve Outcome: Progressing

## 2023-11-08 NOTE — Group Note (Unsigned)
 Date:  11/09/2023 Time:  1:02 AM  Group Topic/Focus:  Wrap-Up Group:   The focus of this group is to help patients review their daily goal of treatment and discuss progress on daily workbooks.    Participation Level:  Active  Participation Quality:  Appropriate and Attentive  Affect:  Appropriate  Cognitive:  Alert and Appropriate  Insight: Appropriate and Good  Engagement in Group:  Engaged  Modes of Intervention:  Discussion and Education  Additional Comments:  Pt attended and participated in wrap up group this evening and rated their day a 10/10. Pt stated that they went to the gym and participated in group therapy today. Pt is anticipating being D/C tomorrow and plans to continue their education and focus on their mental health. Pt has no concerns to relay at this time.   Amy Wall 11/09/2023, 1:02 AM

## 2023-11-08 NOTE — Progress Notes (Incomplete)
 Patient is at her baseline.  She follows up with an ACT team in the community.  She is under the custody of DSS.  She has hotel accommodation.  There is no evidence of depression.  No evidence of overwhelming anxiety.  No evidence of mania.  No evidence of psychosis.  Mental status examination is essentially normal.  Patient is eager to be discharged back into the community.  We are finalizing aftercare.  She is scheduled for discharge tomorrow.

## 2023-11-08 NOTE — Group Note (Signed)
 LCSW Group Therapy Note   Group Date: 11/08/2023 Start Time: 1100 End Time: 1200   Participation:  patient was present and actively participated in the conversation  Type of Therapy:  Group Therapy  Topic:  Stronger Together:  Building Healthy Relationships  Objective:  To explore loneliness, boundaries, and safe ways to build relationships.  Goals: Recognize healthy vs. unhealthy relationships. Learn safe ways to connect with others. Strengthen communication and [employer].  Summary:  Participants discussed loneliness, healthy connections, and setting boundaries. They explored safe ways to meet people and shared personal experiences. Key insights were reinforced through discussion and quotes.  Therapeutic Modalities Used: Cognitive Behavioral Therapy (CBT) Elements - Identifying unhealthy relationship patterns, challenging negative thoughts about connection. Dialectical Behavior Therapy (DBT) Elements - Interpersonal effectiveness, setting and maintaining boundaries. Supportive Group Therapy - Peer discussion, shared experiences, and emotional validation.   Amy Wall, LCSWA 11/08/2023  5:46 PM

## 2023-11-08 NOTE — Progress Notes (Signed)
 Patient presents with animated mood and stated she slept well last night. Patient denies SI,HI, and A/V/H with no plan or intent. Patient denies any recent n/v but did verbalize having an upset stomach and received her scheduled tums and some gingerale. Patient observed attending groups and socializing appropriately. Patient remains cooperative in unit.

## 2023-11-08 NOTE — Progress Notes (Addendum)
 Collateral contact   [place] [employer] Social Worker / Legal Guardian - [name] Hummer, [phone]  Legal Guardian said that she will not be available to pick up patient tomorrow, Thursday, 7/2 because she has 40 clients on her caseload.  She said patient lives in a hotel.  She asked to to send discharge paperwork to [email or web address].     [employer] ([facility]) [facility] - Collie [phone]  Collie said she is Peer Support and will pick up patient tomorrow, 7/2 at 1:30 PM.  She said she has a key to patient's hotel room.     Daaron Dimarco, LCSWA 11/08/2023

## 2023-11-08 NOTE — Progress Notes (Signed)
 [facility] MD Progress Note  11/08/2023 4:04 PM Amy Wall  MRN:  [id number]  Reason for admission: 18 year old AA female with hx of mental health issues. Admitted to the [facility] from the [facility] with complaint of suicidal ideations /thoughts of 3 weeks. Patient is currently under an IVC petition. She presented to the [facility] [place] accompanied by her therapist, stating that she has not been herself lately. Pt also mentioned how she is having thoughts of not wanting to live. Pt mentions she has been feeling this way for 2-3 weeks and she finds herself not using any coping skills. Pt reports that she is afraid that she might end her life if she does not get help.   Today's assessment notes:  On assessment today, the pt reports that her mood is euthymic, improved since admission, and stable. Denies feeling down, depressed, or sad. Patient denies SI, HI, or AVH.  Observe attending and participating in therapeutic milieu and unit group activities.  We will continue to monitor for safety.  No changes in her treatment regimen.  Patient legal guardian [name] Hummer at [phone] called and updated her on estimated date of discharge tomorrow and the medications patient is currently taking.  [name] reported that she appreciates the information provided.  Reports that anxiety symptoms are at manageable level. She presents alert, calm, cooperative, & oriented to person, time, place, and situation.  Sleep is stable. Appetite is stable.  Concentration is without complaint.  Energy level is adequate. Denies having any suicidal thoughts. Denies having any suicidal intent and plan.  Denies having any HI.  Denies having psychotic symptoms.   Denies having side effects to current psychiatric medications.   Discussed discharge planning: How to identify the signs of impending crisis, use of internal coping strategies, reaching out to friends and family that can help navigate a crisis, and a list of mental health professionals  and agencies to call. Further to follow up on her mental health appointments and her PCP appointments.   Principal Problem: MDD (major depressive disorder), recurrent severe, without psychosis (HCC)  Diagnosis: Principal Problem:   MDD (major depressive disorder), recurrent severe, without psychosis (HCC) Active Problems:   PTSD (post-traumatic stress disorder)   Gastroesophageal reflux disease without esophagitis   MDD (major depressive disorder)  Total Time spent with patient: 45 minutes  Past Psychiatric History: See H&P.  Past Medical History:  Past Medical History:  Diagnosis Date   Anxiety    Asthma    DMDD (disruptive mood dysregulation disorder) (HCC)    Epilepsy (HCC)    pseuodoseizures per chart   HTN (hypertension)    Major depressive disorder    Oppositional defiant disorder    PTSD (post-traumatic stress disorder)    Schizophrenia (HCC)    Seizures (HCC)     Past Surgical History:  Procedure Laterality Date   BACK SURGERY     CHOLECYSTECTOMY, LAPAROSCOPIC     Family History:  Family History  Problem Relation Age of Onset   Allergic rhinitis Father    Eczema Sister    Allergic rhinitis Sister    Colon cancer Neg Hx    Rectal cancer Neg Hx    Stomach cancer Neg Hx    Esophageal cancer Neg Hx    Family Psychiatric  History: See H&P.  Social History:  Social History   Substance and Sexual Activity  Alcohol Use Not Currently   Alcohol/week: 4.0 standard drinks of alcohol   Types: 2 Glasses of wine, 2 Shots of  liquor per week     Social History   Substance and Sexual Activity  Drug Use Not Currently   Types: Marijuana, Methamphetamines   Comment: daily    Social History   Socioeconomic History   Marital status: Single    Spouse name: Not on file   Number of children: 0   Years of education: Not on file   Highest education level: Not on file  Occupational History   Not on file  Tobacco Use   Smoking status: Every Day    Types: Cigars     Passive exposure: Yes   Smokeless tobacco: Never   Tobacco comments:    black and mild, marijuana  Vaping Use   Vaping status: Every Day  Substance and Sexual Activity   Alcohol use: Not Currently    Alcohol/week: 4.0 standard drinks of alcohol    Types: 2 Glasses of wine, 2 Shots of liquor per week   Drug use: Not Currently    Types: Marijuana, Methamphetamines    Comment: daily   Sexual activity: Not Currently    Birth control/protection: Condom, Injection  Other Topics Concern   Not on file  Social History Narrative   She lives with her uncle and aunt.   She has six siblings.   Social Drivers of Health   Financial Resource Strain: Medium Risk (06/28/2022)   Overall Financial Resource Strain (CARDIA)    Difficulty of Paying Living Expenses: Somewhat hard  Food Insecurity: Patient Declined (11/07/2023)   Hunger Vital Sign    Worried About Running Out of Food in the Last Year: Patient declined    Ran Out of Food in the Last Year: Patient declined  Transportation Needs: Patient Declined (11/07/2023)   PRAPARE - Administrator, Civil Service (Medical): Patient declined    Lack of Transportation (Non-Medical): Patient declined  Physical Activity: Inactive (06/28/2022)   Exercise Vital Sign    Days of Exercise per Week: 0 days    Minutes of Exercise per Session: 0 min  Stress: No Stress Concern Present (06/28/2022)   [employer] of Occupational Health - Occupational Stress Questionnaire    Feeling of Stress : Only a little  Social Connections: Patient Declined (11/03/2023)   Social Connection and Isolation Panel    Frequency of Communication with Friends and Family: Patient declined    Frequency of Social Gatherings with Friends and Family: Patient declined    Attends Religious Services: Patient declined    Database administrator or Organizations: Patient declined    Attends Engineer, structural: Patient declined    Marital Status: Patient declined    Additional Social History:   Sleep: Good Estimated Sleeping Duration (Last 24 Hours): 8.00-9.00 hours  Appetite:  Fair  Current Medications: Current Facility-Administered Medications  Medication Dose Route Frequency Provider Last Rate Last Admin   acetaminophen  (TYLENOL ) tablet 650 mg  650 mg Oral Q6H PRN Hoang, Daniela B, MD   650 mg at 11/07/23 1144   albuterol  (VENTOLIN  HFA) 108 (90 Base) MCG/ACT inhaler 2 puff  2 puff Inhalation Q6H PRN Collene Gouge I, NP   2 puff at 11/08/23 0624   alum & mag hydroxide-simeth (MAALOX/MYLANTA) 200-200-20 MG/5ML suspension 30 mL  30 mL Oral Q4H PRN Hoang, Daniela B, MD       ARIPiprazole  (ABILIFY ) tablet 15 mg  15 mg Oral Daily Nwoko, Agnes I, NP   15 mg at 11/08/23 0756   calcium  carbonate (TUMS - dosed in mg elemental  calcium ) chewable tablet 200 mg of elemental calcium   1 tablet Oral TID WC Nwoko, Agnes I, NP   200 mg of elemental calcium  at 11/08/23 1201   haloperidol  (HALDOL ) tablet 5 mg  5 mg Oral TID PRN Hoang, Daniela B, MD   5 mg at 11/07/23 1404   And   diphenhydrAMINE  (BENADRYL ) capsule 50 mg  50 mg Oral TID PRN Hoang, Daniela B, MD   50 mg at 11/07/23 1404   haloperidol  lactate (HALDOL ) injection 5 mg  5 mg Intramuscular TID PRN Hoang, Daniela B, MD       And   diphenhydrAMINE  (BENADRYL ) injection 50 mg  50 mg Intramuscular TID PRN Hoang, Daniela B, MD       And   LORazepam  (ATIVAN ) injection 2 mg  2 mg Intramuscular TID PRN Hoang, Daniela B, MD       haloperidol  lactate (HALDOL ) injection 10 mg  10 mg Intramuscular TID PRN Hoang, Daniela B, MD       And   diphenhydrAMINE  (BENADRYL ) injection 50 mg  50 mg Intramuscular TID PRN Hoang, Daniela B, MD       And   LORazepam  (ATIVAN ) injection 2 mg  2 mg Intramuscular TID PRN Hoang, Daniela B, MD       feeding supplement (ENSURE PLUS HIGH PROTEIN) liquid 237 mL  237 mL Oral BID BM Trudy Carwin, NP   237 mL at 11/08/23 0800   fluticasone  furoate-vilanterol (BREO ELLIPTA ) 100-25 MCG/ACT 1  puff  1 puff Inhalation Daily Collene Gouge I, NP   1 puff at 11/08/23 0800   hydrOXYzine  (ATARAX ) tablet 25 mg  25 mg Oral TID PRN Hoang, Daniela B, MD   25 mg at 11/07/23 2201   magnesium  hydroxide (MILK OF MAGNESIA) suspension 30 mL  30 mL Oral Daily PRN Hoang, Daniela B, MD       nicotine  polacrilex (NICORETTE ) gum 2 mg  2 mg Oral PRN Trudy Carwin, NP       ondansetron  (ZOFRAN -ODT) disintegrating tablet 4 mg  4 mg Oral Q8H PRN Collene Gouge I, NP   4 mg at 11/06/23 1636   pantoprazole  (PROTONIX ) EC tablet 40 mg  40 mg Oral BID AC Nwoko, Agnes I, NP   40 mg at 11/08/23 9378   potassium chloride  SA (KLOR-CON  M) CR tablet 40 mEq  40 mEq Oral Once Orin Eberwein C, FNP       sertraline  (ZOLOFT ) tablet 100 mg  100 mg Oral Daily Nwoko, Agnes I, NP   100 mg at 11/08/23 0756   traZODone  (DESYREL ) tablet 50 mg  50 mg Oral QHS PRN Hoang, Daniela B, MD   50 mg at 11/07/23 2201   Lab Results:  Results for orders placed or performed during the hospital encounter of 11/03/23 (from the past 48 hours)  Comprehensive metabolic panel     Status: Abnormal   Collection Time: 11/06/23  8:21 PM  Result Value Ref Range   Sodium 134 (L) 135 - 145 mmol/L   Potassium 3.3 (L) 3.5 - 5.1 mmol/L   Chloride 100 98 - 111 mmol/L   CO2 23 22 - 32 mmol/L   Glucose, Bld 72 70 - 99 mg/dL    Comment: Glucose reference range applies only to samples taken after fasting for at least 8 hours.   BUN 17 6 - 20 mg/dL   Creatinine, Ser 9.33 0.44 - 1.00 mg/dL   Calcium  9.4 8.9 - 10.3 mg/dL   Total Protein 8.6 (H)  6.5 - 8.1 g/dL   Albumin 4.2 3.5 - 5.0 g/dL   AST 19 15 - 41 U/L   ALT 13 0 - 44 U/L   Alkaline Phosphatase 107 38 - 126 U/L   Total Bilirubin 0.8 0.0 - 1.2 mg/dL   GFR, Estimated >39 >39 mL/min    Comment: (NOTE) Calculated using the CKD-EPI Creatinine Equation (2021)    Anion gap 11 5 - 15    Comment: Performed at [facility], [address], [place], [place] [address]  CBC with Differential      Status: None   Collection Time: 11/06/23  8:21 PM  Result Value Ref Range   WBC 9.1 4.0 - 10.5 K/uL   RBC 4.02 3.87 - 5.11 MIL/uL   Hemoglobin 12.0 12.0 - 15.0 g/dL   HCT 63.7 63.9 - 53.9 %   MCV 90.0 80.0 - 100.0 fL   MCH 29.9 26.0 - 34.0 pg   MCHC 33.1 30.0 - 36.0 g/dL   RDW 88.0 88.4 - 84.4 %   Platelets 376 150 - 400 K/uL   nRBC 0.0 0.0 - 0.2 %   Neutrophils Relative % 58 %   Neutro Abs 5.3 1.7 - 7.7 K/uL   Lymphocytes Relative 34 %   Lymphs Abs 3.1 0.7 - 4.0 K/uL   Monocytes Relative 8 %   Monocytes Absolute 0.7 0.1 - 1.0 K/uL   Eosinophils Relative 0 %   Eosinophils Absolute 0.0 0.0 - 0.5 K/uL   Basophils Relative 0 %   Basophils Absolute 0.0 0.0 - 0.1 K/uL   Immature Granulocytes 0 %   Abs Immature Granulocytes 0.02 0.00 - 0.07 K/uL    Comment: Performed at [facility], [address], [place], [place] [address]  Lipase, blood     Status: None   Collection Time: 11/06/23  8:21 PM  Result Value Ref Range   Lipase 32 11 - 51 U/L    Comment: Performed at [facility], [address], [place], [place] [address]  hCG, serum, qualitative     Status: None   Collection Time: 11/06/23  8:21 PM  Result Value Ref Range   Preg, Serum NEGATIVE NEGATIVE    Comment:        THE SENSITIVITY OF THIS METHODOLOGY IS >10 mIU/mL. Performed at [facility], [address], [place], [place] [address]   Urinalysis, w/ Reflex to Culture (Infection Suspected) -Urine, Clean Catch     Status: Abnormal   Collection Time: 11/06/23  8:21 PM  Result Value Ref Range   Specimen Source URINE, CLEAN CATCH    Color, Urine YELLOW YELLOW   APPearance CLEAR CLEAR   Specific Gravity, Urine 1.026 1.005 - 1.030   pH 6.0 5.0 - 8.0   Glucose, UA NEGATIVE NEGATIVE mg/dL   Hgb urine dipstick NEGATIVE NEGATIVE   Bilirubin Urine NEGATIVE NEGATIVE   Ketones, ur 20 (A) NEGATIVE mg/dL   Protein, ur NEGATIVE NEGATIVE mg/dL   Nitrite NEGATIVE NEGATIVE    Leukocytes,Ua MODERATE (A) NEGATIVE   RBC / HPF 0-5 0 - 5 RBC/hpf   WBC, UA 21-50 0 - 5 WBC/hpf    Comment:        Reflex urine culture not performed if WBC <=10, OR if Squamous epithelial cells >5. If Squamous epithelial cells >5 suggest recollection.    Bacteria, UA NONE SEEN NONE SEEN   Squamous Epithelial / HPF 0-5 0 - 5 /HPF   Mucus PRESENT  Comment: Performed at [facility], [address], [place], [place] [address]  Urine Culture     Status: Abnormal   Collection Time: 11/06/23  8:21 PM   Specimen: Urine, Random  Result Value Ref Range   Specimen Description      URINE, RANDOM Performed at [facility], [address], [place], [place] [address]    Special Requests      NONE Reflexed from [id number] Performed at [facility], [address], [place], [place] [address]    Culture (A)     <10,000 COLONIES/mL INSIGNIFICANT GROWTH Performed at [facility], [address] [place], [place] [address]    Report Status 11/08/2023 FINAL   Basic metabolic panel with GFR     Status: None   Collection Time: 11/08/23  6:32 AM  Result Value Ref Range   Sodium 140 135 - 145 mmol/L   Potassium 3.6 3.5 - 5.1 mmol/L   Chloride 102 98 - 111 mmol/L   CO2 24 22 - 32 mmol/L   Glucose, Bld 97 70 - 99 mg/dL    Comment: Glucose reference range applies only to samples taken after fasting for at least 8 hours.   BUN 12 6 - 20 mg/dL   Creatinine, Ser 9.32 0.44 - 1.00 mg/dL   Calcium  9.8 8.9 - 10.3 mg/dL   GFR, Estimated >39 >39 mL/min    Comment: (NOTE) Calculated using the CKD-EPI Creatinine Equation (2021)    Anion gap 14 5 - 15    Comment: Performed at [facility], [address], [place], [place] [address]   Blood Alcohol level:  Lab Results  Component Value Date   [facility] <15 11/02/2023   ETH <10 06/21/2023   Metabolic Disorder Labs: Lab Results  Component Value Date   HGBA1C 4.8 11/02/2023    MPG 91.06 11/02/2023   MPG 100 09/26/2019   Lab Results  Component Value Date   PROLACTIN 13.8 11/03/2020   PROLACTIN 18.2 01/27/2017   Lab Results  Component Value Date   CHOL 169 11/02/2023   TRIG 70 11/02/2023   HDL 42 11/02/2023   CHOLHDL 4.0 11/02/2023   VLDL 14 11/02/2023   LDLCALC 113 (H) 11/02/2023   LDLCALC 122 (H) 08/30/2023   Physical Findings: AIMS:  ,  ,  ,  ,  ,  ,   CIWA:    COWS:     Musculoskeletal: Strength & Muscle Tone: within normal limits Gait & Station: normal Patient leans: N/A  Psychiatric Specialty Exam:  Presentation  General Appearance:  Appropriate for Environment; Casual; Fairly Groomed  Eye Contact: Good  Speech: Clear and Coherent  Speech Volume: Normal  Handedness: Right  Mood and Affect  Mood: Euthymic  Affect: Congruent  Thought Process  Thought Processes: Coherent; Goal Directed  Descriptions of Associations:Intact  Orientation:Full (Time, Place and Person)  Thought Content:Logical; WDL  History of Schizophrenia/Schizoaffective disorder:No  Duration of Psychotic Symptoms:No data recorded Hallucinations:Hallucinations: None  Ideas of Reference:None  Suicidal Thoughts:Suicidal Thoughts: No SI Active Intent and/or Plan: -- (Denies)   Homicidal Thoughts:Homicidal Thoughts: No  Sensorium  Memory: Immediate Good; Recent Good  Judgment: Fair  Insight: Fair  Art therapist  Concentration: Good  Attention Span: Good  Recall: Fair  Fund of Knowledge: Fair  Language: Good  Psychomotor Activity  Psychomotor Activity: Psychomotor Activity: Normal  Assets  Assets: Communication Skills; Desire for Improvement; Housing; Physical Health; Resilience  Sleep  Sleep: Sleep: Good  Number of Hours of Sleep: 8.75  Physical Exam: Physical Exam Vitals and nursing note reviewed.  Constitutional:      Appearance: She is normal weight.  HENT:     Head: Normocephalic.     Nose: Nose  normal.     Mouth/Throat:     Mouth: Mucous membranes are moist.     Pharynx: Oropharynx is clear.   Eyes:     Extraocular Movements: Extraocular movements intact.    Cardiovascular:     Rate and Rhythm: Normal rate.     Pulses: Normal pulses.  Pulmonary:     Effort: Pulmonary effort is normal. No respiratory distress.  Abdominal:     Comments: Deferred  Genitourinary:    Comments: Deferred.  Musculoskeletal:        General: Normal range of motion.     Cervical back: Normal range of motion.   Skin:    General: Skin is warm.   Neurological:     General: No focal deficit present.     Mental Status: She is alert and oriented to person, place, and time.   Psychiatric:        Mood and Affect: Mood normal.        Behavior: Behavior normal.    Review of Systems  Constitutional:  Negative for chills and fever.  HENT:  Negative for congestion and sore throat.   Respiratory:  Negative for cough, shortness of breath and wheezing.   Cardiovascular:  Negative for chest pain and palpitations.  Gastrointestinal:  Negative for constipation, nausea and vomiting.  Genitourinary:  Negative for dysuria.  Musculoskeletal:  Negative for joint pain and myalgias.  Neurological:  Negative for dizziness, tingling, tremors, sensory change, speech change, focal weakness, seizures, loss of consciousness, weakness and headaches.  Endo/Heme/Allergies:        See allergy lists.  Psychiatric/Behavioral:  Positive for depression. Negative for hallucinations, memory loss, substance abuse and suicidal ideas. The patient is nervous/anxious. The patient does not have insomnia.    Blood pressure 118/79, pulse 94, temperature 98.9 F (37.2 C), temperature source Oral, resp. rate 14, height 5' 4 (1.626 m), weight 72.4 kg, SpO2 100%. Body mass index is 27.4 kg/m.  Treatment Plan Summary: Daily contact with patient to assess and evaluate symptoms and progress in treatment and Medication management.    Principal/active diagnoses.  MDD (major depressive disorder), recurrent severe, without psychosis. PTSD (post-traumatic stress disorder) MDD (major depressive disorder)  Plan: The risks/benefits/side-effects/alternatives to the medications in use were discussed in detail with the patient and time was given for patient's questions. The patient consents to medication trial.    Patient requested to be resumed on all her mental health medications. Says she is here to learn the coping skills she needs to deal with her past. And because she states that she does not usually take her medications on regular basis. Her Abilify  was gradually resumed. See the [facility].    -Completed Abilify  5 mg po today 11-04-23 once for mood control.  -Completed Abilify  10 mg po daily once tomorrow 11-05-23 for mood control.  -Continue Abilify  15 mg po daily starting 11-06-23 for mood control.  -Continue Sertraline  100 mg po daily for depression.  -Continue Trazodone  50 mg po Q hs prn for insomnia.  -Continue Hydroxyzine  25 mg po tid prn for anxiety.  --Completed potassium chloride  40 mEq p.o. x 1 only for potassium level of 3.3 --BMP in a.m. 11/08/2023: K+=3.6  Agitation protocols.  -Continue as recommended.  Other medical issues.  -Continue Protonix  40 mg po bid for GERD.  -Continue Albuterol  inhaler 2 puff Q 6 hrs prn for SOB.  -Continue Symbicort  2 puffs bid for SOB.  -Tums 1 tablet po tid for indigestion.  -Lisinopril 5 mg po daily for HTN.   Other PRNS -Continue Tylenol  650 mg every 6 hours PRN for mild pain -Continue Maalox 30 ml Q 4 hrs PRN for indigestion -Continue MOM 30 ml po Q 6 hrs for constipation   Safety and Monitoring: Voluntary admission to inpatient psychiatric unit for safety, stabilization and treatment Daily contact with patient to assess and evaluate symptoms and progress in treatment Patient's case to be discussed in multi-disciplinary team meeting Observation Level : q15 minute  checks Vital signs: q12 hours Precautions: Safety   Discharge Planning: Social work and case management to assist with discharge planning and identification of hospital follow-up needs prior to discharge Estimated LOS: 5-7 days Discharge Concerns: Need to establish a safety plan; Medication compliance and effectiveness Discharge Goals: Return home with outpatient referrals for mental health follow-up including medication management/psychotherapy  Ellouise JAYSON Azure, FNP. 11/08/2023, 4:04 PM Patient ID: Olden MARLA Minerva, female   DOB: 07/25/2005, 18 y.o.   MRN: [id number] Patient ID: GALILEA QUITO, female   DOB: June 29, 2005, 18 y.o.   MRN: [id number] Patient ID: JACIE TRISTAN, female   DOB: 2006-03-15, 18 y.o.   MRN: [id number]

## 2023-11-08 NOTE — Group Note (Signed)
 Date:  11/08/2023 Time:  11:09 AM  Group Topic/Focus:  Goals Group:   The focus of this group is to help patients establish daily goals to achieve during treatment and discuss how the patient can incorporate goal setting into their daily lives to aide in recovery. Orientation:   The focus of this group is to educate the patient on the purpose and policies of crisis stabilization and provide a format to answer questions about their admission.  The group details unit policies and expectations of patients while admitted.    Participation Level:  Did Not Attend   Amy Wall 11/08/2023, 11:09 AM

## 2023-11-08 NOTE — Group Note (Signed)
 Recreation Therapy Group Note   Group Topic:Animal Assisted Therapy   Group Date: 11/08/2023 Start Time: 0946 End Time: 1029 Facilitators: Pasha Gadison-McCall, LRT,CTRS Location: [address] Day[address]   Animal-Assisted Activity (AAA) Program Checklist/Progress Notes Patient Eligibility Criteria Checklist & Daily Group note for Rec Tx Intervention  AAA/T Program Assumption of Risk Form signed by Patient/ or Parent Legal Guardian Yes  Patient is free of allergies or severe asthma Yes  Patient reports no fear of animals Yes  Patient reports no history of cruelty to animals Yes  Patient understands his/her participation is voluntary Yes  Patient washes hands before animal contact Yes  Patient washes hands after animal contact Yes  Behavioral Response: Attentive   Education: Hand Washing, Appropriate Animal Interaction   Education Outcome: Acknowledges education.    Affect/Mood: Appropriate   Participation Level: Moderate   Participation Quality: Independent   Behavior: Attentive    Speech/Thought Process: Relevant   Insight: Fair   Judgement: Fair    Modes of Intervention: Teaching laboratory technician   Patient Response to Interventions:  Attentive   Education Outcome:  In group clarification offered    Clinical Observations/Individualized Feedback: Pt came in late to group. Pt asked some questions and had some engagement with Dixie. Pt left early and didn't return.    Plan: Continue to engage patient in RT group sessions 2-3x/week.   Sadrac Zeoli-McCall, LRT,CTRS 11/08/2023 12:51 PM

## 2023-11-09 DIAGNOSIS — F332 Major depressive disorder, recurrent severe without psychotic features: Secondary | ICD-10-CM

## 2023-11-09 MED ORDER — SERTRALINE HCL 100 MG PO TABS: 100.0000 mg | ORAL_TABLET | Freq: Every day | ORAL | 0 refills | Status: AC

## 2023-11-09 MED ORDER — PANTOPRAZOLE SODIUM 40 MG PO TBEC: 40.0000 mg | DELAYED_RELEASE_TABLET | Freq: Two times a day (BID) | ORAL | 0 refills | Status: AC

## 2023-11-09 MED ORDER — NICOTINE POLACRILEX 2 MG MT GUM: 2.0000 mg | CHEWING_GUM | OROMUCOSAL | 0 refills | Status: AC | PRN

## 2023-11-09 MED ORDER — CALCIUM CARBONATE ANTACID 500 MG PO CHEW: 1.0000 | CHEWABLE_TABLET | Freq: Three times a day (TID) | ORAL | 0 refills | Status: AC

## 2023-11-09 MED ORDER — ARIPIPRAZOLE 15 MG PO TABS: 15.0000 mg | ORAL_TABLET | Freq: Every day | ORAL | 0 refills | Status: AC

## 2023-11-09 MED ORDER — FLUTICASONE FUROATE-VILANTEROL 100-25 MCG/ACT IN AEPB: 1.0000 | INHALATION_SPRAY | Freq: Every day | RESPIRATORY_TRACT | 0 refills | Status: AC
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
## 2023-11-09 NOTE — [facility] Suicide Risk Assessment (Addendum)
 [facility] INPATIENT:  Family/Significant Other Suicide Prevention Education  Suicide Prevention Education:  Education Completed; with patient,  (name of family member/significant other) has been identified by the patient as the family member/significant other with whom the patient will be residing, and identified as the person(s) who will aid the patient in the event of a mental health crisis (suicidal ideations/suicide attempt).  With written consent from the patient, the family member/significant other has been provided the following suicide prevention education, prior to the and/or following the discharge of the patient.  Patient was given Suicide Prevention Information brochure. Patient was given a phone number for ACTT crisis line.  The suicide prevention education provided includes the following: Suicide risk factors Suicide prevention and interventions National Suicide Hotline telephone number [facility] assessment telephone number [facility] Emergency Assistance 911 [facility] and/or Residential Mobile Crisis Unit telephone number  Request made of family/significant other to: Remove weapons (e.g., guns, rifles, knives), all items previously/currently identified as safety concern.   Remove drugs/medications (over-the-counter, prescriptions, illicit drugs), all items previously/currently identified as a safety concern.  The family member/significant other verbalizes understanding of the suicide prevention education information provided.  The family member/significant other agrees to remove the items of safety concern listed above.  Fusaye Wachtel O Parisa Pinela, LCSWA 11/09/2023, 11:06 AM

## 2023-11-09 NOTE — Plan of Care (Signed)
  Problem: Education: Goal: Emotional status will improve Outcome: Progressing Goal: Mental status will improve Outcome: Progressing Goal: Verbalization of understanding the information provided will improve Outcome: Progressing   Problem: Activity: Goal: Interest or engagement in activities will improve Outcome: Progressing   Problem: Safety: Goal: Periods of time without injury will increase Outcome: Progressing

## 2023-11-09 NOTE — Group Note (Signed)
 Recreation Therapy Group Note   Group Topic:Leisure Education  Group Date: 11/09/2023 Start Time: 0930 End Time: 1026 Facilitators: Netra Postlethwait-McCall, LRT,CTRS Location: [address] Dayroom   Group Topic: Leisure Education  Goal Area(s) Addresses:  Patient will identify positive leisure and recreation activities.  Patient will identify one positive benefit of participation in leisure activities.   Behavioral Response: Engaged  Intervention: Group Game  Activity: Guess the Lyric. Patients were to spin the spinner and whatever category [employer], Pop, Hip Hop, R&B, Dance and Indie) the spinner landed on, the patient got a card from that deck. LRT read the lyric and the patient got 3 chances to guess the missing lyric. If they are unable to guess the lyric, the other players got the chance to guess the answer. If the spinner landed on the black triangle, the could steal a card from another player. The person with the most cards was the winner.  Education:  Teacher, English as a foreign language, Special educational needs teacher, Building control surveyor  Education Outcome: Acknowledges education/In group clarification offered/Needs additional education   Affect/Mood: Appropriate   Participation Level: Engaged   Participation Quality: Independent   Behavior: Appropriate   Speech/Thought Process: Focused   Insight: Good   Judgement: Good   Modes of Intervention: Competitive Play   Patient Response to Interventions:  Engaged   Education Outcome:  In group clarification offered    Clinical Observations/Individualized Feedback: Pt was engaged and social with peers. Pt had to be reigned in with some of the answers she would throw. Pt was able to refocus and continue to participate in the activity.    Plan: Continue to engage patient in RT group sessions 2-3x/week.   Reham Slabaugh-McCall, LRT,CTRS 11/09/2023 12:41 PM

## 2023-11-09 NOTE — Group Note (Signed)
 Date:  11/09/2023 Time:  10:41 AM  Group Topic/Focus:  Goals Group:   The focus of this group is to help patients establish daily goals to achieve during treatment and discuss how the patient can incorporate goal setting into their daily lives to aide in recovery. Orientation:   The focus of this group is to educate the patient on the purpose and policies of crisis stabilization and provide a format to answer questions about their admission.  The group details unit policies and expectations of patients while admitted.    Participation Level:  Active  Participation Quality:  Appropriate  Affect:  Appropriate  Cognitive:  Alert, Appropriate, and Oriented  Insight: Appropriate  Engagement in Group:  Engaged  Modes of Intervention:  Discussion  Additional Comments:  Pt states goal is to move forward with discharge and to  keep it pushing and stay positive.  Jaedin Trumbo A Deannie Resetar 11/09/2023, 10:41 AM

## 2023-11-09 NOTE — Progress Notes (Signed)
 Pt presents with anxious affect and pleasant mood. Denies SI, HI, AVH, and pain at this time. Pt was observed socializing with peers in the milieu and attending evening wrap up group. Cooperative in interactions with staff. Medication compliant with no adverse reactions. Safety checks maintained at q 15 minutes. Support, encouragement, and reassurance offered to the pt.

## 2023-11-09 NOTE — Progress Notes (Addendum)
 Collateral contact  [employer] ([facility]) [facility] ([facility])  Collie [phone]  Collie said that her other coworker, Marien Pottier (peer support), will pick her up at 1:30 PM.  CSW asked if she could confirm with Chelsey.  Crissy said she was driving so couldn't provide Chelsey's phone number, but will ask Chelsey to call CSW.   Landynn Dupler, LCSWA 11/09/2023

## 2023-11-09 NOTE — [facility] Suicide Risk Assessment (Signed)
 [facility] Discharge Suicide Risk Assessment   Principal Problem: MDD (major depressive disorder), recurrent severe, without psychosis (HCC) Discharge Diagnoses: Principal Problem:   MDD (major depressive disorder), recurrent severe, without psychosis (HCC) Active Problems:   PTSD (post-traumatic stress disorder)   Gastroesophageal reflux disease without esophagitis   MDD (major depressive disorder)   Total Time spent with patient: 30 minutes  Musculoskeletal: Strength & Muscle Tone: within normal limits Gait & Station: normal Patient leans: N/A  Psychiatric Specialty Exam  Presentation  General Appearance and behavior:  Casually dressed, not in any distress, appropriate behavior, engaged politely.  No EPS.  Eye Contact: Good.  Speech: Spontaneous.  Normal rate, tone and volume.  Normal prosody of speech.  Mood and Affect  Mood: Euthymic.  Affect: Full range and appropriate.  Thought Process  Thought Processes: Linear and goal directed.  Descriptions of Associations:Intact  Orientation:Full (Time, Place and Person)  Thought Content: Future oriented.  No current suicidal thoughts.  No homicidal thoughts.  No thoughts of violence.  No negative ruminative flooding.  No guilty ruminations.  No delusional theme.  No obsessions.  Hallucinations: No hallucination in any modality.  Sensorium  Memory: Good.  Judgment: Good.  Insight: Good  Executive Functions  Concentration: Good.  Attention Span: Good.  Recall: Good.  Fund of Knowledge: Good.  Language: Good   Psychomotor Activity  Normal psychomotor activity   Physical Exam: Physical Exam ROS Blood pressure 118/83, pulse 82, temperature 99.1 F (37.3 C), temperature source Oral, resp. rate 16, height 5' 4 (1.626 m), weight 72.4 kg, SpO2 100%. Body mass index is 27.4 kg/m.  Mental Status Per Nursing Assessment::   On Admission:  NA  Demographic Factors:  Adolescent or young adult and  Living alone  Loss Factors: NA  Historical Factors: Family history of mental illness or substance abuse and Impulsivity  Risk Reduction Factors:   Positive social support, Positive therapeutic relationship, and Positive coping skills or problem solving skills  Continued Clinical Symptoms:  No residual features of depression.  No overwhelming anxiety.  No evidence of mania.  No evidence of psychosis.  No derealization or depersonalization.  Normalized biological rhythms.   Cognitive Features That Contribute To Risk:  None    Suicide Risk:  Minimal: No current suicidal thoughts.  No current homicidal thoughts.  No current thoughts of violence.  Modifiable risk factor targeted during this admission as mood disorder.  Patient is back on psychotropic medication.  She has stabilized on her regimen.  There are no new psychosocial stressors.  She looks forward to discharge today. Patient is stable for care at the lower setting.  Follow-up Information     [facility] Go on 11/28/2023.   Why: You have an appointment for medication management services on 11/28/23 at 1:00 pm .  The appointment will be held in person, but you may call to switch to Virtual. Contact information: [address] [place] [address] [phone]         [facility] Go on 11/15/2023.   Specialty: Professional Counselor Why: Please go to this provider on 11/15/23 at 9:00 am, or Monday through Friday, from 9 am to 1 pm for an assessment, to obtain therapy services. Contact information: [address] [place]  [address] [place] [address] [phone]         [facility] Follow up.   Why: Please contact [facility] for continuation of care Contact information: [facility] Building [address] [place], [place] [address] [phone])  [phone] After hours crisis line:  [phone]                Plan Of Care/Follow-up recommendations:  See discharge  summary  Jerrell DELENA Forehand, MD 11/09/2023, 8:33 AM

## 2023-11-09 NOTE — Progress Notes (Addendum)
  [facility] Adult Case Management Discharge Plan :  Will you be returning to the same living situation after discharge:  Yes,  patient lives in a hotel At discharge, do you have transportation home?: Yes,  Marien Riis [phone] (Peer Support) from [facility] will pick her up at 1:30 PM Do you have the ability to pay for your medications: Yes,  patient has insurance  Release of information consent forms completed and in the chart;  Patient's signature needed at discharge.  Patient to Follow up at:  Follow-up Information     [facility] Follow up.   Specialty: Professional Counselor Why: You may go to this provider Monday through Friday, from 9 am to 1 pm for an assessment, to obtain therapy services. Contact information: [address] [place]  [address] [place] [address] [phone]         [facility] Follow up on 11/10/2023.   Why: Please contact this provider on 11/10/23 at 9:00 am for [facility] service continuation of care  After hours crisis line: [phone] Contact information: [facility] Building [address] [place], [place] [address] [phone]                Next level of care provider has access to [facility] Link:no  Safety Planning and Suicide Prevention discussed: Yes,  with patient  Has patient been referred to the Quitline?: Patient refused referral for treatment. Patient admitted to smoking.  When asked if she would like to stop smoking, she said, not really and added that she will decrease the use on her own.  Patient has been referred for addiction treatment:  At admission, patient tested positive for marijuana.  Patient would like to stop using, she said she will discuss this with [facility] therapist.     Sabastion Hrdlicka O Maysin Carstens, LCSWA 11/09/2023, 11:09 AM

## 2023-11-09 NOTE — Progress Notes (Signed)
 Patient is discharging at this time. Patient is A&Ox4. Stable. Patient denies SI,HI, and A/V/H with no plan/intent. Printed AVS reviewed with and given to patient along with medications and follow up appointments. Suicide safety plan complete with copy provided to patient. Original form in chart. Patient verbalized all understanding. All valuables/belongings returned to patient. Patient is being transported by her ACT team . Patient denies any pain/discomfort. No s/s of current distress.

## 2023-11-20 ENCOUNTER — Other Ambulatory Visit: Payer: Self-pay

## 2023-11-20 ENCOUNTER — Emergency Department (HOSPITAL_COMMUNITY): Payer: MEDICAID

## 2023-11-20 ENCOUNTER — Emergency Department (HOSPITAL_COMMUNITY)
Admission: EM | Admit: 2023-11-20 | Discharge: 2023-11-20 | Disposition: A | Discharge status: Home / Self Care | Payer: MEDICAID | Attending: Emergency Medicine | Admitting: Emergency Medicine

## 2023-11-20 ENCOUNTER — Encounter (HOSPITAL_COMMUNITY): Payer: Self-pay | Admitting: Emergency Medicine

## 2023-11-20 DIAGNOSIS — M25562 Pain in left knee: Secondary | ICD-10-CM | POA: Diagnosis present

## 2023-11-20 DIAGNOSIS — Z91010 Allergy to peanuts: Secondary | ICD-10-CM | POA: Diagnosis not present

## 2023-11-20 DIAGNOSIS — W108XXA Fall (on) (from) other stairs and steps, initial encounter: Secondary | ICD-10-CM | POA: Insufficient documentation

## 2023-11-20 DIAGNOSIS — W19XXXA Unspecified fall, initial encounter: Secondary | ICD-10-CM

## 2023-11-20 NOTE — ED Provider Notes (Signed)
 [place] EMERGENCY DEPARTMENT AT [facility] Provider Note   CSN: [id number] Arrival date & time: 11/20/23  2140     Patient presents with: Fall and Leg Pain   Amy Wall is a 18 y.o. female.   The history is provided by the patient and medical records.  Fall  Leg Pain  18 year old female presenting to the ED after a fall.  States she got her feet tangled up going down the stairs and fell down approximately 5 steps.  States during this she twisted her left knee and has been having a lot of pain since then.  She denies any head injury or loss of consciousness.  Pain isolated to the left knee, worse with weightbearing and ambulation.  Denies any numbness or weakness of the leg.  No prior knee injuries or surgeries.  Prior to Admission medications   Medication Sig Start Date End Date Taking? Authorizing Provider  albuterol  (VENTOLIN  HFA) 108 (90 Base) MCG/ACT inhaler Inhale 2 puffs into the lungs every 6 (six) hours as needed for wheezing or shortness of breath. 10/20/23   Billy Knee, FNP  ARIPiprazole  (ABILIFY ) 15 MG tablet Take 1 tablet (15 mg total) by mouth daily. 11/09/23   Ntuen, Tina C, FNP  calcium  carbonate (TUMS - DOSED IN MG ELEMENTAL CALCIUM ) 500 MG chewable tablet Chew 1 tablet (200 mg of elemental calcium  total) by mouth 3 (three) times daily with meals. 11/09/23   Ntuen, Tina C, FNP  fluticasone  furoate-vilanterol (BREO ELLIPTA ) 100-25 MCG/ACT AEPB Inhale 1 puff into the lungs daily. 11/10/23   Ntuen, Tina C, FNP  nicotine  polacrilex (NICORETTE ) 2 MG gum Take 1 each (2 mg total) by mouth as needed for smoking cessation. 11/09/23   Ntuen, Tina C, FNP  pantoprazole  (PROTONIX ) 40 MG tablet Take 1 tablet (40 mg total) by mouth 2 (two) times daily before a meal. 11/09/23   Ntuen, Ellouise BROCKS, FNP  sertraline  (ZOLOFT ) 100 MG tablet Take 1 tablet (100 mg total) by mouth daily. 11/09/23   Ntuen, Tina C, FNP    Allergies: Chocolate, Peanut-containing drug products, Pineapple,  Shellfish allergy, Strawberry extract, and Charentais melon (french melon)    Review of Systems  Musculoskeletal:  Positive for arthralgias.  All other systems reviewed and are negative.   Updated Vital Signs BP (!) 127/93 (BP Location: Left Arm)   Pulse 79   Temp 98.2 F (36.8 C) (Oral)   Resp 19   Wt 72.4 kg   LMP  (LMP Unknown)   SpO2 98%   BMI 27.40 kg/m   Physical Exam Vitals and nursing note reviewed.  Constitutional:      Appearance: She is well-developed.  HENT:     Head: Normocephalic and atraumatic.  Eyes:     Conjunctiva/sclera: Conjunctivae normal.     Pupils: Pupils are equal, round, and reactive to light.  Cardiovascular:     Rate and Rhythm: Normal rate and regular rhythm.     Heart sounds: Normal heart sounds.  Pulmonary:     Effort: Pulmonary effort is normal.     Breath sounds: Normal breath sounds.  Musculoskeletal:        General: Normal range of motion.     Cervical back: Normal range of motion.     Comments: Left knee without significant swelling or bony deformity, there is tenderness elicited along the lateral aspect, no significant laxity, leg is neurovascularly intact  Skin:    General: Skin is warm and dry.  Neurological:  Mental Status: She is alert and oriented to person, place, and time.     (all labs ordered are listed, but only abnormal results are displayed) Labs Reviewed - No data to display  EKG: None  Radiology: DG Knee Complete 4 Views Left Result Date: 11/20/2023 CLINICAL DATA:  Left leg pain following fall down stairs, initial encounter EXAM: LEFT KNEE - COMPLETE 4+ VIEW COMPARISON:  None Available. FINDINGS: No evidence of fracture, dislocation, or joint effusion. No evidence of arthropathy or other focal bone abnormality. Soft tissues are unremarkable. IMPRESSION: No acute abnormality noted. Electronically Signed   By: Oneil Devonshire M.D.   On: 11/20/2023 23:06     Procedures   Medications Ordered in the ED - No data  to display                                  Medical Decision Making Amount and/or Complexity of Data Reviewed Radiology: ordered and independent interpretation performed.   18 year old female presenting to the ED with left knee pain after a fall down approximately 5 stairs.  No head injury or loss of consciousness.  Worsening pain with weightbearing and ambulation.  She has some tenderness along the lateral joint line on exam but has no acute deformities.  Her leg is neurovascular intact.  X-ray is negative.  Requested brace and crutches which were given.  She can follow-up with PCP and/or orthopedics if worsening.  Can return here for new concerns.  Final diagnoses:  Fall, initial encounter  Acute pain of left knee    ED Discharge Orders     None          Jarold Olam [name] 11/20/23 2322    Patt Alm Macho, MD 11/21/23 [phone]

## 2023-11-20 NOTE — ED Triage Notes (Signed)
 Pt in via [facility] with L leg pain after trip fall down about 5 stairs tonight. Pt states she has L hip pain that radiates entire L leg, has good PMS and able to stand on it per [facility]. Denies any head/neck pain or LOC

## 2023-11-20 NOTE — Discharge Instructions (Signed)
 Tylenol  or motrin  as needed for pain. Can follow-up with your primary care doctor.  If worsening, would recommend follow-up with orthopedics. Return here for new concerns.

## 2023-12-01 ENCOUNTER — Ambulatory Visit
Admission: EM | Admit: 2023-12-01 | Discharge: 2023-12-01 | Disposition: A | Discharge status: Home / Self Care | Payer: MEDICAID

## 2023-12-01 DIAGNOSIS — J45901 Unspecified asthma with (acute) exacerbation: Secondary | ICD-10-CM

## 2023-12-01 LAB — POCT RAPID STREP A (OFFICE): Rapid Strep A Screen: NEGATIVE

## 2023-12-01 MED ORDER — PREDNISONE 20 MG PO TABS: 40.0000 mg | ORAL_TABLET | Freq: Every day | ORAL | 0 refills | Status: AC

## 2023-12-01 MED ORDER — AMOXICILLIN-POT CLAVULANATE 875-125 MG PO TABS
1.0000 | ORAL_TABLET | Freq: Two times a day (BID) | ORAL | 0 refills | Status: DC
Start: 2023-12-01 — End: 2024-02-01

## 2023-12-01 NOTE — ED Triage Notes (Addendum)
 Here with [facility]. My throat has been hurting, feel's like something is scraping my throat, ha, chills, some cough (at times), no runny nose. No new/unexplained rash. Symptoms started about 5 days ago.
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
## 2023-12-09 ENCOUNTER — Emergency Department (HOSPITAL_COMMUNITY): Payer: MEDICAID

## 2023-12-09 ENCOUNTER — Other Ambulatory Visit: Payer: Self-pay

## 2023-12-09 ENCOUNTER — Emergency Department (HOSPITAL_COMMUNITY)
Admission: EM | Admit: 2023-12-09 | Discharge: 2023-12-09 | Disposition: A | Discharge status: Home / Self Care | Payer: MEDICAID | Attending: Emergency Medicine | Admitting: Emergency Medicine

## 2023-12-09 ENCOUNTER — Encounter (HOSPITAL_COMMUNITY): Payer: Self-pay

## 2023-12-09 DIAGNOSIS — R0602 Shortness of breath: Secondary | ICD-10-CM | POA: Diagnosis present

## 2023-12-09 DIAGNOSIS — B9789 Other viral agents as the cause of diseases classified elsewhere: Secondary | ICD-10-CM | POA: Insufficient documentation

## 2023-12-09 DIAGNOSIS — J069 Acute upper respiratory infection, unspecified: Secondary | ICD-10-CM | POA: Diagnosis not present

## 2023-12-09 DIAGNOSIS — Z91010 Allergy to peanuts: Secondary | ICD-10-CM | POA: Diagnosis not present

## 2023-12-09 DIAGNOSIS — J4541 Moderate persistent asthma with (acute) exacerbation: Secondary | ICD-10-CM | POA: Diagnosis not present

## 2023-12-09 LAB — RESP PANEL BY RT-PCR (RSV, FLU A&B, COVID)  RVPGX2
Influenza A by PCR: NEGATIVE
Influenza B by PCR: NEGATIVE
Resp Syncytial Virus by PCR: NEGATIVE
SARS Coronavirus 2 by RT PCR: NEGATIVE

## 2023-12-09 MED ORDER — PREDNISONE 10 MG (21) PO TBPK
ORAL_TABLET | Freq: Every day | ORAL | 0 refills | Status: DC
Start: 2023-12-09 — End: 2024-02-01

## 2023-12-09 MED ORDER — ALBUTEROL SULFATE HFA 108 (90 BASE) MCG/ACT IN AERS: 1.0000 | INHALATION_SPRAY | Freq: Four times a day (QID) | RESPIRATORY_TRACT | 0 refills | Status: AC | PRN

## 2023-12-09 NOTE — ED Provider Notes (Signed)
 [place] EMERGENCY DEPARTMENT AT [facility] Provider Note   CSN: [id number] Arrival date & time: 12/09/23  1334     Patient presents with: Nasal Congestion, Cough, and Fever   Amy Wall is a 18 y.o. female who presents to the ED today with primary concern of nasal congestion and increasing shortness of breath.  She has a previous medical diagnosis of asthma, uses Symbicort  and albuterol  for the same, has found herself needing to use her albuterol  inhaler 4-5 times daily and also is finding herself waking up once to twice nightly over the last 2-3 nights to use her albuterol  inhaler secondary to shortness of breath.  At the same time she has also had increasing rhinorrhea and postnasal drip, nasal congestion, and cough productive of clear thin sputum.    Cough Associated symptoms: fever, rhinorrhea and shortness of breath   Fever Associated symptoms: congestion, cough and rhinorrhea        Prior to Admission medications   Medication Sig Start Date End Date Taking? Authorizing Provider  albuterol  (VENTOLIN  HFA) 108 (90 Base) MCG/ACT inhaler Inhale 1-2 puffs into the lungs every 6 (six) hours as needed for wheezing or shortness of breath. 12/09/23  Yes Myriam Dorn BROCKS, PA  predniSONE  (STERAPRED UNI-PAK 21 TAB) 10 MG (21) TBPK tablet Take by mouth daily. Take 6 tabs by mouth daily  for 2 days, then 5 tabs for 2 days, then 4 tabs for 2 days, then 3 tabs for 2 days, 2 tabs for 2 days, then 1 tab by mouth daily for 2 days 12/09/23  Yes Myriam Dorn BROCKS, PA  albuterol  (VENTOLIN  HFA) 108 (90 Base) MCG/ACT inhaler Inhale 2 puffs into the lungs every 6 (six) hours as needed for wheezing or shortness of breath. 10/20/23   Billy Knee, FNP  amoxicillin -clavulanate (AUGMENTIN ) 875-125 MG tablet Take 1 tablet by mouth every 12 (twelve) hours. 12/01/23   Billy Asberry FALCON, PA-C  ARIPiprazole  (ABILIFY ) 15 MG tablet Take 1 tablet (15 mg total) by mouth daily. 11/09/23   Ntuen, Tina C,  FNP  calcium  carbonate (TUMS - DOSED IN MG ELEMENTAL CALCIUM ) 500 MG chewable tablet Chew 1 tablet (200 mg of elemental calcium  total) by mouth 3 (three) times daily with meals. 11/09/23   Ntuen, Tina C, FNP  cromolyn (OPTICROM) 4 % ophthalmic solution SMARTSIG:1 Drop(s) In Eye(s) Twice Daily PRN 10/05/23   [provider]  fluticasone  furoate-vilanterol (BREO ELLIPTA ) 100-25 MCG/ACT AEPB Inhale 1 puff into the lungs daily. 11/10/23   Ntuen, Tina C, FNP  nicotine  polacrilex (NICORETTE ) 2 MG gum Take 1 each (2 mg total) by mouth as needed for smoking cessation. 11/09/23   Ntuen, Tina C, FNP  nystatin cream (MYCOSTATIN) Apply topically.    [provider]  pantoprazole  (PROTONIX ) 40 MG tablet Take 1 tablet (40 mg total) by mouth 2 (two) times daily before a meal. 11/09/23   Ntuen, Ellouise BROCKS, FNP  RESTASIS 0.05 % ophthalmic emulsion 1 drop 2 (two) times daily. 10/05/23   [provider]  sertraline  (ZOLOFT ) 100 MG tablet Take 1 tablet (100 mg total) by mouth daily. 11/09/23   Ntuen, Tina C, FNP    Allergies: Chocolate, Peanut-containing drug products, Pineapple, Shellfish allergy, Shellfish-derived products, Strawberry extract, and Charentais melon (french melon)    Review of Systems  Constitutional:  Positive for fever.  HENT:  Positive for congestion, postnasal drip and rhinorrhea.   Respiratory:  Positive for cough and shortness of breath.  Updated Vital Signs BP (!) 146/83 (BP Location: Right Arm)   Pulse 77   Temp 98.9 F (37.2 C) (Oral)   Resp 18   Ht 5' 4 (1.626 m)   Wt 71.2 kg   LMP 11/08/2023 (Approximate)   SpO2 99%   BMI 26.94 kg/m   Physical Exam Vitals and nursing note reviewed.  Constitutional:      General: She is not in acute distress.    Appearance: She is well-developed.  HENT:     Head: Normocephalic and atraumatic.     Right Ear: Hearing, tympanic membrane, ear canal and external ear normal.     Left Ear: Hearing, tympanic membrane, ear canal  and external ear normal.     Nose:     Right Turbinates: Swollen.     Left Turbinates: Swollen.     Comments: Noted bilateral mucosal edema in the nares, with the inferior turbinate on the right being profoundly edematous.  Bilateral nares are patent with restriction on the right.    Mouth/Throat:     Lips: Pink.     Mouth: Mucous membranes are moist.     Pharynx: Uvula midline. Postnasal drip present.     Tonsils: No tonsillar exudate or tonsillar abscesses.  Eyes:     Conjunctiva/sclera: Conjunctivae normal.  Cardiovascular:     Rate and Rhythm: Normal rate and regular rhythm.     Heart sounds: No murmur heard. Pulmonary:     Effort: Pulmonary effort is normal. No respiratory distress.     Breath sounds: Normal breath sounds and air entry.  Abdominal:     Palpations: Abdomen is soft.     Tenderness: There is no abdominal tenderness.  Musculoskeletal:        General: No swelling.     Cervical back: Neck supple.  Skin:    General: Skin is warm and dry.     Capillary Refill: Capillary refill takes less than 2 seconds.  Neurological:     Mental Status: She is alert.  Psychiatric:        Mood and Affect: Mood normal.     (all labs ordered are listed, but only abnormal results are displayed) Labs Reviewed  RESP PANEL BY RT-PCR (RSV, FLU A&B, COVID)  [id number]    EKG: None  Radiology: DG Chest 1 View Result Date: 12/09/2023 CLINICAL DATA:  Shortness of breath. EXAM: CHEST  1 VIEW COMPARISON:  07/11/2023. FINDINGS: The heart size and mediastinal contours are within normal limits. Both lungs are clear. The visualized skeletal structures are unremarkable. IMPRESSION: No acute cardiopulmonary findings. Electronically Signed   By: Harrietta Sherry M.D.   On: 12/09/2023 14:45     Procedures   Medications Ordered in the ED - No data to display                                  Medical Decision Making Amount and/or Complexity of Data Reviewed Radiology:  ordered.  Risk Prescription drug management.   Medical Decision Making:   Amy Wall is a 18 y.o. female who presented to the ED today with nasal congestion and increasing shortness of breath detailed above.    External chart has been reviewed including previous labs and imaging. Patient's presentation is complicated by their history of asthma.  Complete initial physical exam performed, notably the patient  was alert and oriented in no apparent distress.  Notable exam findings of substantially  enlarged inferior turbinates bilaterally with right being greater than left, as well as postnasal drip and erythematous posterior oropharynx..    Reviewed and confirmed nursing documentation for past medical history, family history, social history.    Initial Assessment:   With the patient's presentation of nasal congestion and increased shortness of breath, most likely diagnosis is viral URI with asthma exacerbation.    Initial Plan:  Obtain nasopharyngeal swab for respiratory panel CXR to evaluate for structural/infectious intrathoracic pathology.  Objective evaluation as below reviewed   Initial Study Results:   Laboratory  All laboratory results reviewed without evidence of clinically relevant pathology.   Exceptions include: None  Radiology:  All images reviewed independently. Agree with radiology report at this time.   DG Chest 1 View Result Date: 12/09/2023 CLINICAL DATA:  Shortness of breath. EXAM: CHEST  1 VIEW COMPARISON:  07/11/2023. FINDINGS: The heart size and mediastinal contours are within normal limits. Both lungs are clear. The visualized skeletal structures are unremarkable. IMPRESSION: No acute cardiopulmonary findings. Electronically Signed   By: Harrietta Sherry M.D.   On: 12/09/2023 14:45   DG Knee Complete 4 Views Left Result Date: 11/20/2023 CLINICAL DATA:  Left leg pain following fall down stairs, initial encounter EXAM: LEFT KNEE - COMPLETE 4+ VIEW COMPARISON:   None Available. FINDINGS: No evidence of fracture, dislocation, or joint effusion. No evidence of arthropathy or other focal bone abnormality. Soft tissues are unremarkable. IMPRESSION: No acute abnormality noted. Electronically Signed   By: Oneil Devonshire M.D.   On: 11/20/2023 23:06     Reassessment and Plan:   Initial assessment with patient along with review of the objective data obtained, respiratory panel does not show any positive results, making this likely a viral respiratory infection secondary to an unspecified rhinovirus.  Chest x-ray did not show any acute pulmonary findings and lung auscultation noted good air entry to all fields however patient does have noted increased use of her rescue inhaler over the last several days including nocturnal dyspnea.  As such we will begin her on a course of prednisone  as noted with anticipated follow-up to primary care within the next 2 weeks to reevaluate.  Careful return precautions were given, and patient understands agrees, has no further concerns at this time.       Final diagnoses:  Viral upper respiratory tract infection  Moderate persistent asthma with exacerbation    ED Discharge Orders          Ordered    predniSONE  (STERAPRED UNI-PAK 21 TAB) 10 MG (21) TBPK tablet  Daily        12/09/23 1444    albuterol  (VENTOLIN  HFA) 108 (90 Base) MCG/ACT inhaler  Every 6 hours PRN        12/09/23 1447               Myriam Dorn BROCKS, [place] 12/09/23 1450    Trine Raynell Moder, MD 12/09/23 1704

## 2023-12-09 NOTE — ED Triage Notes (Signed)
 Pt reports with fever, cough, and congestion x 1.5 weeks. Pt states that she is sick as a dog.

## 2024-01-10 ENCOUNTER — Encounter (HOSPITAL_COMMUNITY): Payer: Self-pay | Admitting: Emergency Medicine

## 2024-01-10 ENCOUNTER — Emergency Department (HOSPITAL_COMMUNITY)
Admission: EM | Admit: 2024-01-10 | Discharge: 2024-01-10 | Disposition: A | Discharge status: Home / Self Care | Payer: MEDICAID | Attending: Emergency Medicine | Admitting: Emergency Medicine

## 2024-01-10 ENCOUNTER — Telehealth: Payer: Self-pay

## 2024-01-10 DIAGNOSIS — Z91010 Allergy to peanuts: Secondary | ICD-10-CM | POA: Diagnosis not present

## 2024-01-10 DIAGNOSIS — A5901 Trichomonal vulvovaginitis: Secondary | ICD-10-CM | POA: Insufficient documentation

## 2024-01-10 DIAGNOSIS — B9689 Other specified bacterial agents as the cause of diseases classified elsewhere: Secondary | ICD-10-CM | POA: Diagnosis not present

## 2024-01-10 DIAGNOSIS — N939 Abnormal uterine and vaginal bleeding, unspecified: Secondary | ICD-10-CM | POA: Diagnosis present

## 2024-01-10 DIAGNOSIS — N760 Acute vaginitis: Secondary | ICD-10-CM | POA: Insufficient documentation

## 2024-01-10 DIAGNOSIS — A599 Trichomoniasis, unspecified: Secondary | ICD-10-CM

## 2024-01-10 LAB — COMPREHENSIVE METABOLIC PANEL WITH GFR
ALT: 13 U/L (ref 0–44)
AST: 17 U/L (ref 15–41)
Albumin: 4.4 g/dL (ref 3.5–5.0)
Alkaline Phosphatase: 93 U/L (ref 38–126)
Anion gap: 13 (ref 5–15)
BUN: 11 mg/dL (ref 6–20)
CO2: 23 mmol/L (ref 22–32)
Calcium: 9.9 mg/dL (ref 8.9–10.3)
Chloride: 105 mmol/L (ref 98–111)
Creatinine, Ser: 0.57 mg/dL (ref 0.44–1.00)
GFR, Estimated: >60 mL/min (ref >60)
Glucose, Bld: 80 mg/dL (ref 70–99)
Potassium: 3.7 mmol/L (ref 3.5–5.1)
Sodium: 142 mmol/L (ref 135–145)
Total Bilirubin: 0.4 mg/dL (ref 0.0–1.2)
Total Protein: 7.6 g/dL (ref 6.5–8.1)

## 2024-01-10 LAB — URINALYSIS, ROUTINE W REFLEX MICROSCOPIC
Bilirubin Urine: NEGATIVE
Glucose, UA: NEGATIVE mg/dL
Ketones, ur: NEGATIVE mg/dL
Nitrite: NEGATIVE
Protein, ur: 100 mg/dL — AB
RBC / HPF: >50 RBC/hpf (ref 0–5)
Specific Gravity, Urine: 1.026 (ref 1.005–1.030)
pH: 6 (ref 5.0–8.0)

## 2024-01-10 LAB — CBC
HCT: 37.2 % (ref 36.0–46.0)
Hemoglobin: 12.2 g/dL (ref 12.0–15.0)
MCH: 30.3 pg (ref 26.0–34.0)
MCHC: 32.8 g/dL (ref 30.0–36.0)
MCV: 92.3 fL (ref 80.0–100.0)
Platelets: 317 K/uL (ref 150–400)
RBC: 4.03 MIL/uL (ref 3.87–5.11)
RDW: 12.7 % (ref 11.5–15.5)
WBC: 6.2 K/uL (ref 4.0–10.5)
nRBC: 0 % (ref 0.0–0.2)

## 2024-01-10 LAB — WET PREP, GENITAL
Sperm: NONE SEEN
WBC, Wet Prep HPF POC: <10 (ref <10)
Yeast Wet Prep HPF POC: NONE SEEN

## 2024-01-10 LAB — LIPASE, BLOOD: Lipase: 40 U/L (ref 11–51)

## 2024-01-10 LAB — HCG, SERUM, QUALITATIVE: Preg, Serum: NEGATIVE

## 2024-01-10 MED ORDER — MEGESTROL ACETATE 40 MG PO TABS: 40.0000 mg | ORAL_TABLET | Freq: Every day | ORAL | 0 refills | Status: DC

## 2024-01-10 MED ORDER — METRONIDAZOLE 500 MG PO TABS
500.0000 mg | ORAL_TABLET | Freq: Once | ORAL | Status: AC
  Administered 2024-01-10: 500 mg via ORAL
  Filled 2024-01-10: qty 1

## 2024-01-10 MED ORDER — METRONIDAZOLE 500 MG PO TABS: 500.0000 mg | ORAL_TABLET | Freq: Two times a day (BID) | ORAL | 0 refills | Status: DC

## 2024-01-10 MED ORDER — MEGESTROL ACETATE 40 MG PO TABS
40.0000 mg | ORAL_TABLET | Freq: Every day | ORAL | Status: DC
  Administered 2024-01-10: 40 mg via ORAL
  Filled 2024-01-10: qty 1

## 2024-01-10 NOTE — ED Triage Notes (Signed)
 Pt arriving via [facility] for abnormal vaginal bleeding x3 days. Pt also has H. Pylori so she is having some abdominal issues as well.

## 2024-01-10 NOTE — Discharge Instructions (Addendum)
 Start Megace , 40mg  once daily for 30 days. Contact the [facility] to schedule follow-up in regard to your abnormal uterine bleeding. Start Metronidazole , one tablet by mouth twice daily for 7 days for treatment of bacterial vaginosis and trichomoniasis. Do not drink alcohol while taking Metronidazole . You received your first dose of these medications in the emergency department this evening. If you have had any recent sexual partners, they will need to be treated for trichomoniasis as well. Return to the emergency department if your symptoms worsen. Follow-up with your primary care provider.

## 2024-01-10 NOTE — ED Provider Notes (Signed)
 [place] EMERGENCY DEPARTMENT AT [facility] Provider Note   CSN: [id number] Arrival date & time: 01/10/24  1629     Patient presents with: Vaginal Bleeding   Amy Wall is a 18 y.o. female.   18 year old female presenting with vaginal bleeding.  Patient tells me that she has had heavy vaginal bleeding off and on for 2 months, says this is abnormal for her typically she has normal menstrual cycles.  She tells me when she woke up today she went to use the bathroom and blood was pouring into the toilet, she believes she has passed clots as well but is unsure.  She has a recent history of H. pylori, and is following up with her primary care provider in regard to this, endorses occasional nausea/vomiting which she attributes to the H. pylori.  Endorses occasional abdominal cramping, none currently.  Denies dizziness/lightheadedness, vaginal odor/itching.   Vaginal Bleeding      Prior to Admission medications   Medication Sig Start Date End Date Taking? Authorizing Provider  albuterol  (VENTOLIN  HFA) 108 (90 Base) MCG/ACT inhaler Inhale 2 puffs into the lungs every 6 (six) hours as needed for wheezing or shortness of breath. 10/20/23   Billy Knee, FNP  albuterol  (VENTOLIN  HFA) 108 (90 Base) MCG/ACT inhaler Inhale 1-2 puffs into the lungs every 6 (six) hours as needed for wheezing or shortness of breath. 12/09/23   Myriam Dorn BROCKS, PA  amoxicillin -clavulanate (AUGMENTIN ) 875-125 MG tablet Take 1 tablet by mouth every 12 (twelve) hours. 12/01/23   Billy Asberry FALCON, PA-C  ARIPiprazole  (ABILIFY ) 15 MG tablet Take 1 tablet (15 mg total) by mouth daily. 11/09/23   Ntuen, Tina C, FNP  calcium  carbonate (TUMS - DOSED IN MG ELEMENTAL CALCIUM ) 500 MG chewable tablet Chew 1 tablet (200 mg of elemental calcium  total) by mouth 3 (three) times daily with meals. 11/09/23   Ntuen, Tina C, FNP  cromolyn (OPTICROM) 4 % ophthalmic solution SMARTSIG:1 Drop(s) In Eye(s) Twice Daily PRN 10/05/23    [provider]  fluticasone  furoate-vilanterol (BREO ELLIPTA ) 100-25 MCG/ACT AEPB Inhale 1 puff into the lungs daily. 11/10/23   Ntuen, Tina C, FNP  nicotine  polacrilex (NICORETTE ) 2 MG gum Take 1 each (2 mg total) by mouth as needed for smoking cessation. 11/09/23   Ntuen, Tina C, FNP  nystatin cream (MYCOSTATIN) Apply topically.    [provider]  pantoprazole  (PROTONIX ) 40 MG tablet Take 1 tablet (40 mg total) by mouth 2 (two) times daily before a meal. 11/09/23   Ntuen, Ellouise BROCKS, FNP  predniSONE  (STERAPRED UNI-PAK 21 TAB) 10 MG (21) TBPK tablet Take by mouth daily. Take 6 tabs by mouth daily  for 2 days, then 5 tabs for 2 days, then 4 tabs for 2 days, then 3 tabs for 2 days, 2 tabs for 2 days, then 1 tab by mouth daily for 2 days 12/09/23   Myriam Dorn BROCKS, PA  RESTASIS 0.05 % ophthalmic emulsion 1 drop 2 (two) times daily. 10/05/23   [provider]  sertraline  (ZOLOFT ) 100 MG tablet Take 1 tablet (100 mg total) by mouth daily. 11/09/23   Ntuen, Tina C, FNP    Allergies: Chocolate, Peanut-containing drug products, Pineapple, Shellfish allergy, Shellfish-derived products, Strawberry extract, and Charentais melon (french melon)    Review of Systems  Genitourinary:  Positive for vaginal bleeding.    Updated Vital Signs  Vitals:   01/10/24 1634 01/10/24 2003  BP: 130/79 130/84  Pulse: 75 80  Resp: 16 17  Temp: 98.4 F (36.9 C) 98.5 F (36.9 C)  TempSrc: Oral   SpO2: 100% 99%  Weight: 70.8 kg   Height: 5' 2 (1.575 m)      Physical Exam Vitals and nursing note reviewed.  HENT:     Head: Normocephalic.  Eyes:     Extraocular Movements: Extraocular movements intact.  Cardiovascular:     Rate and Rhythm: Normal rate.  Pulmonary:     Effort: Pulmonary effort is normal.  Abdominal:     Palpations: Abdomen is soft.     Tenderness: There is no abdominal tenderness. There is no guarding.  Genitourinary:    Comments: Pelvic exam performed with nurse Myla at  the bedside as chaperone  No CMT or friability, blood oozing from cervical os, no visualized clots or retained foreign body Musculoskeletal:     Cervical back: Normal range of motion.     Comments: Moves all extremities spontaneously without difficulty  Skin:    General: Skin is warm and dry.  Neurological:     Mental Status: She is alert and oriented to person, place, and time.     (all labs ordered are listed, but only abnormal results are displayed) Labs Reviewed  WET PREP, GENITAL - Abnormal; Notable for the following components:      Result Value   Trich, Wet Prep PRESENT (*)    Clue Cells Wet Prep HPF POC PRESENT (*)    All other components within normal limits  URINALYSIS, ROUTINE W REFLEX MICROSCOPIC - Abnormal; Notable for the following components:   APPearance HAZY (*)    Hgb urine dipstick LARGE (*)    Protein, ur 100 (*)    Leukocytes,Ua SMALL (*)    Bacteria, UA RARE (*)    All other components within normal limits  LIPASE, BLOOD  COMPREHENSIVE METABOLIC PANEL WITH GFR  CBC  HCG, SERUM, QUALITATIVE  GC/CHLAMYDIA PROBE AMP ([place]) NOT AT [facility]    EKG: None  Radiology: No results found.   Procedures   Medications Ordered in the ED  metroNIDAZOLE  (FLAGYL ) tablet 500 mg (has no administration in time range)  megestrol  (MEGACE ) tablet 40 mg (has no administration in time range)                                    Medical Decision Making This patient presents to the ED for concern of abnormal uterine bleeding, this involves an extensive number of treatment options, and is a complaint that carries with it a high risk of complications and morbidity.  The differential diagnosis includes uterine fibroids, adenomyosis, pregnancy, sexually transmitted infection   Co morbidities that complicate the patient evaluation  ODD, MDD   Additional history obtained:  Additional history obtained from record review External records from outside source obtained  and reviewed including previous ED note   Lab Tests:  I Ordered, and personally interpreted labs.  The pertinent results include: CBC within normal limits, hemoglobin stable from previous.  Urinalysis notable for large RBCs, 100 protein, small leukocytes with rare bacteria, I have a low suspicion for urinary tract infection and suspect these findings are secondary to contaminant from vaginal bleeding.  CMP normal.  Lipase normal.  Serum hCG negative.  Wet prep notable for trichomonas and clue cells.  Gonorrhea/chlamydia probe pending at time of discharge.   Cardiac Monitoring: / EKG:  The patient was maintained on a cardiac monitor.  I personally  viewed and interpreted the cardiac monitored which showed an underlying rhythm of: NSR   Consultations Obtained:  I requested consultation with the OBGYN,  and discussed lab and imaging findings as well as pertinent plan - they recommend: I spoke with Dr. Alger who recommends 40mg  Megace  daily x 30 days, patient will follow-up with [facility] and they will be in touch with her to schedule follow-up in regard to her abnormal uterine bleeding.   Problem List / ED Course / Critical interventions / Medication management  I ordered medication including Megace  and Flagyl   for abnormal uterine bleeding and trichomoniasis/BV  I have reviewed the patients home medicines and have made adjustments as needed   Social Determinants of Health:  Financial strain   Test / Admission - Considered:  Physical exam is notable as above.  Patient endorses 2 months of consistent vaginal bleeding, I spoke with the OB/GYN service as above who recommends Megace  and OB/GYN follow-up.  Patient's wet prep is positive for trichomonas and bacterial vaginosis, I discussed this with her and advised that any sexual partners will need to be treated for trichomoniasis as well.  Patient was given the first dose of her Megace  and Flagyl  in the emergency department today,  she understands that while taking Flagyl  she should not drink alcohol.  I will provide her with contact information for the [facility] to schedule follow-up, Dr. Alger tells me that their office should be in touch with her in the coming days to schedule follow-up.  She is in agreement with this plan and is appropriate for discharge at this time.    Amount and/or Complexity of Data Reviewed Labs: ordered.  Risk Prescription drug management.        Final diagnoses:  Vaginal bleeding  BV (bacterial vaginosis)  Trichomonas vaginalis infection    ED Discharge Orders          Ordered    metroNIDAZOLE  (FLAGYL ) 500 MG tablet  2 times daily        01/10/24 2020    megestrol  (MEGACE ) 40 MG tablet  Daily        01/10/24 2020               Glendia Rocky SAILOR, [place] 01/10/24 2024    Tegeler, Lonni PARAS, MD 01/10/24 [phone]

## 2024-01-10 NOTE — Telephone Encounter (Signed)
 Called Pt wasn't able to leave a VM. Pt not able to be  reached through MyChart also reached out to emergency contact wasn't able to leave message. Rosina Senters, NP advise pt to go to ED.    Avelina Finder, CMA

## 2024-01-10 NOTE — ED Notes (Signed)
 Attempted USIV w/o success.  Did manage to get blood work.

## 2024-01-11 LAB — GC/CHLAMYDIA PROBE AMP (~~LOC~~) NOT AT ARMC
Chlamydia: POSITIVE — AB
Comment: NEGATIVE
Comment: NORMAL
Neisseria Gonorrhea: POSITIVE — AB

## 2024-01-12 ENCOUNTER — Ambulatory Visit (HOSPITAL_COMMUNITY): Payer: Self-pay

## 2024-01-17 ENCOUNTER — Other Ambulatory Visit: Payer: Self-pay | Admitting: Adult Health

## 2024-01-17 ENCOUNTER — Ambulatory Visit: Payer: MEDICAID | Admitting: *Deleted

## 2024-01-17 ENCOUNTER — Ambulatory Visit: Payer: MEDICAID

## 2024-01-17 ENCOUNTER — Encounter (INDEPENDENT_AMBULATORY_CARE_PROVIDER_SITE_OTHER): Payer: MEDICAID | Admitting: Adult Health

## 2024-01-17 DIAGNOSIS — A5402 Gonococcal vulvovaginitis, unspecified: Secondary | ICD-10-CM

## 2024-01-17 DIAGNOSIS — A549 Gonococcal infection, unspecified: Secondary | ICD-10-CM | POA: Diagnosis not present

## 2024-01-17 DIAGNOSIS — A749 Chlamydial infection, unspecified: Secondary | ICD-10-CM

## 2024-01-17 MED ORDER — CEFTRIAXONE SODIUM 500 MG IJ SOLR
500.0000 mg | Freq: Once | INTRAMUSCULAR | Status: AC
  Administered 2024-01-17: 500 mg via INTRAMUSCULAR

## 2024-01-17 MED ORDER — DOXYCYCLINE HYCLATE 100 MG PO TABS: 100.0000 mg | ORAL_TABLET | Freq: Two times a day (BID) | ORAL | 0 refills | Status: DC

## 2024-01-17 NOTE — Progress Notes (Signed)
 Rx sent in for doxycycline

## 2024-01-17 NOTE — Progress Notes (Signed)
   NURSE VISIT- INJECTION  SUBJECTIVE:  Amy Wall is a 18 y.o. G33P0010 female here for a Rocephin  for treatment for gonorrhea. She is a GYN patient.   OBJECTIVE:  LMP 01/10/2024 (Exact Date)   Appears well, in no apparent distress  Injection administered in: Right upper quad. gluteus  Meds ordered this encounter  Medications   cefTRIAXone  (ROCEPHIN ) injection 500 mg    ASSESSMENT: GYN patient Rocephin  for contraception/period management and treatment for gonorrhea PLAN: Follow-up: in 4 weeks for proof of cure   Alan LITTIE Fischer  01/17/2024 3:44 PM

## 2024-01-18 NOTE — Progress Notes (Signed)
 error    This encounter was created in error - please disregard.

## 2024-01-20 ENCOUNTER — Other Ambulatory Visit: Payer: Self-pay

## 2024-02-01 ENCOUNTER — Telehealth: Payer: Self-pay | Admitting: Internal Medicine

## 2024-02-01 ENCOUNTER — Other Ambulatory Visit: Payer: Self-pay

## 2024-02-01 ENCOUNTER — Ambulatory Visit: Payer: MEDICAID | Admitting: Internal Medicine

## 2024-02-01 NOTE — Telephone Encounter (Signed)
 Dismissal letter signed. ?

## 2024-02-01 NOTE — Telephone Encounter (Signed)
 04/21/2023 no show 06/23/2023 same day cancel 07/26/2023 no show    02/01/2024 no show  Dismisssal generated. Sent via [employer].

## 2024-02-14 ENCOUNTER — Other Ambulatory Visit (HOSPITAL_COMMUNITY)
Admission: RE | Admit: 2024-02-14 | Discharge: 2024-02-14 | Disposition: A | Discharge status: Home / Self Care | Payer: MEDICAID | Source: Ambulatory Visit

## 2024-02-14 ENCOUNTER — Ambulatory Visit (INDEPENDENT_AMBULATORY_CARE_PROVIDER_SITE_OTHER): Payer: MEDICAID

## 2024-02-14 VITALS — BP 102/59 | HR 76 | Wt 162.0 lb

## 2024-02-14 DIAGNOSIS — A549 Gonococcal infection, unspecified: Secondary | ICD-10-CM | POA: Diagnosis not present

## 2024-02-14 DIAGNOSIS — N898 Other specified noninflammatory disorders of vagina: Secondary | ICD-10-CM | POA: Diagnosis present

## 2024-02-14 DIAGNOSIS — A749 Chlamydial infection, unspecified: Secondary | ICD-10-CM

## 2024-02-14 DIAGNOSIS — N926 Irregular menstruation, unspecified: Secondary | ICD-10-CM | POA: Diagnosis not present

## 2024-02-14 DIAGNOSIS — Z3202 Encounter for pregnancy test, result negative: Secondary | ICD-10-CM

## 2024-02-14 LAB — POCT URINE PREGNANCY: Preg Test, Ur: NEGATIVE

## 2024-02-14 NOTE — Progress Notes (Signed)
   GYNECOLOGY PROBLEM OFFICE VISIT NOTE  History:  Amy Wall is a 18 y.o. G1P0010 here today for test of cure. She states she has had CT/GC for  a long time and states this was because she was not adequately treated. She states that she has not been sexually active, since treatment, and reports her partner was aware of diagnosis.   Past Medical History:  Diagnosis Date   Anxiety    Asthma    DMDD (disruptive mood dysregulation disorder)    Epilepsy (HCC)    pseuodoseizures per chart   HTN (hypertension)    Major depressive disorder    Oppositional defiant disorder    PTSD (post-traumatic stress disorder)    Schizophrenia (HCC)    Seizures (HCC)     Past Surgical History:  Procedure Laterality Date   BACK SURGERY     CHOLECYSTECTOMY, LAPAROSCOPIC      The following portions of the patient's history were reviewed and updated as appropriate: allergies, current medications, past family history, past medical history, past social history, past surgical history and problem list.   Health Maintenance:  Patient's last menstrual period was 01/10/2024 (approximate). No pap d/t age.  No mammogram on file d/t age.   Review of Systems:  Breasts: No concerns Genito-Urinary ROS: no dysuria, trouble voiding, or hematuria Gastrointestinal ROS: negative Objective:  Vitals: BP (!) 102/59 (BP Location: Left Arm, Patient Position: Sitting, Cuff Size: Large)   Pulse 76   Wt 162 lb (73.5 kg)   LMP 01/10/2024 (Approximate)   BMI 29.63 kg/m   Physical Exam: Physical Exam Constitutional:      Appearance: Normal appearance.  Genitourinary:     Vulva normal.     Genitourinary Comments: CV collected blindly. Scant amt white discharge Malodor present.  HENT:     Head: Normocephalic and atraumatic.  Eyes:     Conjunctiva/sclera: Conjunctivae normal.  Cardiovascular:     Rate and Rhythm: Normal rate.  Neurological:     Mental Status: She is alert and oriented to person, place,  and time.  Skin:    General: Skin is warm and dry.  Psychiatric:        Mood and Affect: Mood normal.        Behavior: Behavior normal.  Vitals reviewed. Exam conducted with a chaperone present Tedi, Charity fundraiser).      Labs and Imaging: No results found.  Assessment & Plan:  18 year old History of GC/CT Vaginal Odor   -Reviewed patient medication. Educated on medication class and indications for use.  -Discussed safe sex practices.  -STD testing performed and infection testing with consideration for vaginal odor.  -Review of chart shows treatment for GC with rocephin , but no recent medication order for CT. However, patient states that she took dosing. Will await results.  -Informed that results will be available in ~ 24-48 hrs via mychart and office will contact regarding any necessary treatments. -RTO PRN or yearly for annual exam.    Total face-to-face time with patient: 10 minutes Additional 4 minutes spent reviewing chart including previous treatments, and labs.    Synthia Raisin, CNM 02/14/2024 11:34 AM

## 2024-02-15 LAB — CERVICOVAGINAL ANCILLARY ONLY
Bacterial Vaginitis (gardnerella): POSITIVE — AB
Candida Glabrata: NEGATIVE
Candida Vaginitis: NEGATIVE
Chlamydia: POSITIVE — AB
Comment: NEGATIVE
Comment: NEGATIVE
Comment: NEGATIVE
Comment: NEGATIVE
Comment: NEGATIVE
Comment: NORMAL
Neisseria Gonorrhea: NEGATIVE
Trichomonas: NEGATIVE

## 2024-02-20 ENCOUNTER — Other Ambulatory Visit: Payer: Self-pay | Admitting: Obstetrics & Gynecology

## 2024-02-20 DIAGNOSIS — A749 Chlamydial infection, unspecified: Secondary | ICD-10-CM

## 2024-02-20 DIAGNOSIS — B9689 Other specified bacterial agents as the cause of diseases classified elsewhere: Secondary | ICD-10-CM

## 2024-02-20 MED ORDER — AZITHROMYCIN 500 MG PO TABS: 1000.0000 mg | ORAL_TABLET | Freq: Once | ORAL | 0 refills | Status: AC

## 2024-02-20 MED ORDER — METRONIDAZOLE 500 MG PO TABS: 500.0000 mg | ORAL_TABLET | Freq: Two times a day (BID) | ORAL | 0 refills | Status: DC

## 2024-02-20 NOTE — Progress Notes (Signed)
 Chlamydia - Plan: azithromycin (ZITHROMAX) 500 MG tablet  BV (bacterial vaginosis) - Plan: metroNIDAZOLE  (FLAGYL ) 500 MG tablet

## 2024-02-21 ENCOUNTER — Ambulatory Visit: Payer: Self-pay

## 2024-03-05 NOTE — Progress Notes (Deleted)
 Amy Console, PA-C [address] [place], [place]  [address] Phone: [phone]   Primary Care Physician: No primary care provider on file.  Primary Gastroenterologist:  Amy Console, PA-C / Amy Holt, MD   Chief Complaint: Hospital follow-up chronic abdominal pain, history of H. pylori infection, nausea/vomiting   HPI:   Discussed the use of AI scribe software for clinical note transcription with the patient, who gave verbal consent to proceed.  History of Present Illness   03/2023 EGD by Dr. Holt: Normal esophagus and stomach.  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  Otherwise normal duodenum.  Biopsies showed H. pylori gastritis.  Negative for celiac.  No intestinal metaplasia or dysplasia.  H. pylori was treated with pantoprazole , Pepto-Bismol, metronidazole , doxycycline  x 14 days.  PMH: Personality disorder, nicotine  dependence, opioid use disorder, cannabis use disorder, alcohol abuse, seizure disorder, GERD, chronic nausea and vomiting, asthma, PTSD, depression.  11/06/2023 CT abdomen pelvis with contrast: No acute abnormality.  01/10/2024: Negative serum pregnancy.  Normal CBC, CMP, and lipase.  Current Outpatient Medications  Medication Sig Dispense Refill   albuterol  (VENTOLIN  HFA) 108 (90 Base) MCG/ACT inhaler Inhale 2 puffs into the lungs every 6 (six) hours as needed for wheezing or shortness of breath. 18 g 2   albuterol  (VENTOLIN  HFA) 108 (90 Base) MCG/ACT inhaler Inhale 1-2 puffs into the lungs every 6 (six) hours as needed for wheezing or shortness of breath. 2 each 0   ARIPiprazole  (ABILIFY ) 15 MG tablet Take 1 tablet (15 mg total) by mouth daily. 30 tablet 0   budesonide -formoterol  (SYMBICORT ) 80-4.5 MCG/ACT inhaler Inhale 2 puffs into the lungs 2 (two) times daily. (Patient not taking: Reported on 02/14/2024)     calcium  carbonate (TUMS - DOSED IN MG ELEMENTAL CALCIUM ) 500 MG chewable tablet Chew 1 tablet (200 mg of elemental  calcium  total) by mouth 3 (three) times daily with meals. 30 tablet 0   cromolyn (OPTICROM) 4 % ophthalmic solution SMARTSIG:1 Drop(s) In Eye(s) Twice Daily PRN     fluticasone  furoate-vilanterol (BREO ELLIPTA ) 100-25 MCG/ACT AEPB Inhale 1 puff into the lungs daily. 1 each 0   megestrol  (MEGACE ) 40 MG tablet Take 1 tablet (40 mg total) by mouth daily. 30 tablet 0   metroNIDAZOLE  (FLAGYL ) 500 MG tablet Take 1 tablet (500 mg total) by mouth 2 (two) times daily. 14 tablet 0   nicotine  polacrilex (NICORETTE ) 2 MG gum Take 1 each (2 mg total) by mouth as needed for smoking cessation. (Patient not taking: Reported on 02/14/2024) 100 tablet 0   nystatin cream (MYCOSTATIN) Apply topically.     pantoprazole  (PROTONIX ) 40 MG tablet Take 1 tablet (40 mg total) by mouth 2 (two) times daily before a meal. 60 tablet 0   RESTASIS 0.05 % ophthalmic emulsion 1 drop 2 (two) times daily.     sertraline  (ZOLOFT ) 100 MG tablet Take 1 tablet (100 mg total) by mouth daily. 30 tablet 0   No current facility-administered medications for this visit.    Allergies as of 03/06/2024 - Review Complete 02/14/2024  Allergen Reaction Noted   Chocolate Anaphylaxis, Itching, Swelling, and Other (See Comments) 10/22/2022   Peanut-containing drug products Anaphylaxis, Hives, Itching, and Other (See Comments) 01/25/2017   Pineapple Anaphylaxis and Hives 05/20/2020   Shellfish allergy Anaphylaxis and Hives 12/13/2019   Shellfish protein-containing drug products Other (See Comments) 06/21/2023   Strawberry extract Anaphylaxis and Hives 12/13/2019   Charentais melon (french melon) Swelling and Other (  See Comments) 11/12/2022    Past Medical History:  Diagnosis Date   Anxiety    Asthma    DMDD (disruptive mood dysregulation disorder)    Epilepsy (HCC)    pseuodoseizures per chart   HTN (hypertension)    Major depressive disorder    Oppositional defiant disorder    PTSD (post-traumatic stress disorder)    Schizophrenia (HCC)     Seizures (HCC)     Past Surgical History:  Procedure Laterality Date   BACK SURGERY     CHOLECYSTECTOMY, LAPAROSCOPIC      Review of Systems:    All systems reviewed and negative except where noted in HPI.    Physical Exam:  LMP 01/10/2024 (Approximate)  Patient's last menstrual period was 01/10/2024 (approximate).  General: Well-nourished, well-developed in no acute distress.  Lungs: Clear to auscultation bilaterally. Non-labored. Heart: Regular rate and rhythm, no murmurs rubs or gallops.  Abdomen: Bowel sounds are normal; Abdomen is Soft; No hepatosplenomegaly, masses or hernias;  No Abdominal Tenderness; No guarding or rebound tenderness. Neuro: Alert and oriented x 3.  Grossly intact.  Psych: Alert and cooperative, normal mood and affect.   Imaging Studies: No results found.  Labs: CBC    Component Value Date/Time   WBC 6.2 01/10/2024 1756   RBC 4.03 01/10/2024 1756   HGB 12.2 01/10/2024 1756   HGB 10.6 (L) 07/12/2022 0952   HCT 37.2 01/10/2024 1756   HCT 32.9 (L) 07/12/2022 0952   PLT 317 01/10/2024 1756   PLT 492 (H) 07/12/2022 0952   MCV 92.3 01/10/2024 1756   MCV 81 07/12/2022 0952   MCH 30.3 01/10/2024 1756   MCHC 32.8 01/10/2024 1756   RDW 12.7 01/10/2024 1756   RDW 14.3 07/12/2022 0952   LYMPHSABS 3.1 11/06/2023 2021   LYMPHSABS 2.8 07/12/2022 0952   MONOABS 0.7 11/06/2023 2021   EOSABS 0.0 11/06/2023 2021   EOSABS 0.0 07/12/2022 0952   BASOSABS 0.0 11/06/2023 2021   BASOSABS 0.0 07/12/2022 0952    CMP     Component Value Date/Time   NA 142 01/10/2024 1756   NA 141 07/12/2022 0952   K 3.7 01/10/2024 1756   CL 105 01/10/2024 1756   CO2 23 01/10/2024 1756   GLUCOSE 80 01/10/2024 1756   BUN 11 01/10/2024 1756   BUN 11 07/12/2022 0952   CREATININE 0.57 01/10/2024 1756   CALCIUM  9.9 01/10/2024 1756   PROT 7.6 01/10/2024 1756   PROT 7.0 07/12/2022 0952   ALBUMIN 4.4 01/10/2024 1756   ALBUMIN 3.9 (L) 07/12/2022 0952   AST 17 01/10/2024  1756   ALT 13 01/10/2024 1756   ALKPHOS 93 01/10/2024 1756   BILITOT 0.4 01/10/2024 1756   BILITOT 0.2 07/12/2022 0952   GFRNONAA >60 01/10/2024 1756   GFRAA NOT CALCULATED 12/12/2019 1614       Assessment and Plan:   Amy Wall is a 18 y.o. y/o female returns for follow-up of:  1.  Upper abdominal pain 2.  H. pylori gastritis 3.  Chronic nausea and vomiting 4.  Marijuana use disorder  Assessment and Plan Assessment & Plan       Amy Console, PA-C  Follow up ***

## 2024-03-06 ENCOUNTER — Ambulatory Visit: Payer: MEDICAID | Admitting: Physician Assistant

## 2024-03-19 ENCOUNTER — Encounter (HOSPITAL_COMMUNITY): Payer: Self-pay

## 2024-03-19 ENCOUNTER — Inpatient Hospital Stay (HOSPITAL_COMMUNITY)
Admission: EM | Admit: 2024-03-19 | Discharge: 2024-03-26 | DRG: 918 | Disposition: A | Discharge status: Home / Self Care | Payer: MEDICAID | Attending: Family Medicine | Admitting: Family Medicine

## 2024-03-19 ENCOUNTER — Other Ambulatory Visit: Payer: Self-pay

## 2024-03-19 ENCOUNTER — Emergency Department (HOSPITAL_COMMUNITY): Payer: MEDICAID

## 2024-03-19 DIAGNOSIS — T1491XA Suicide attempt, initial encounter: Secondary | ICD-10-CM | POA: Diagnosis not present

## 2024-03-19 DIAGNOSIS — F329 Major depressive disorder, single episode, unspecified: Secondary | ICD-10-CM | POA: Diagnosis present

## 2024-03-19 DIAGNOSIS — Z91010 Allergy to peanuts: Secondary | ICD-10-CM

## 2024-03-19 DIAGNOSIS — Z87891 Personal history of nicotine dependence: Secondary | ICD-10-CM

## 2024-03-19 DIAGNOSIS — F419 Anxiety disorder, unspecified: Secondary | ICD-10-CM | POA: Diagnosis not present

## 2024-03-19 DIAGNOSIS — Z9102 Food additives allergy status: Secondary | ICD-10-CM

## 2024-03-19 DIAGNOSIS — T43642A Poisoning by ecstasy, intentional self-harm, initial encounter: Principal | ICD-10-CM | POA: Diagnosis present

## 2024-03-19 DIAGNOSIS — Z79899 Other long term (current) drug therapy: Secondary | ICD-10-CM

## 2024-03-19 DIAGNOSIS — E8721 Acute metabolic acidosis: Secondary | ICD-10-CM | POA: Diagnosis present

## 2024-03-19 DIAGNOSIS — R569 Unspecified convulsions: Secondary | ICD-10-CM

## 2024-03-19 DIAGNOSIS — K219 Gastro-esophageal reflux disease without esophagitis: Secondary | ICD-10-CM | POA: Diagnosis present

## 2024-03-19 DIAGNOSIS — F1510 Other stimulant abuse, uncomplicated: Secondary | ICD-10-CM | POA: Diagnosis present

## 2024-03-19 DIAGNOSIS — R531 Weakness: Secondary | ICD-10-CM | POA: Diagnosis not present

## 2024-03-19 DIAGNOSIS — E876 Hypokalemia: Secondary | ICD-10-CM | POA: Diagnosis present

## 2024-03-19 DIAGNOSIS — Z1152 Encounter for screening for COVID-19: Secondary | ICD-10-CM

## 2024-03-19 DIAGNOSIS — J45909 Unspecified asthma, uncomplicated: Secondary | ICD-10-CM | POA: Diagnosis present

## 2024-03-19 DIAGNOSIS — G40909 Epilepsy, unspecified, not intractable, without status epilepticus: Secondary | ICD-10-CM | POA: Diagnosis present

## 2024-03-19 DIAGNOSIS — I10 Essential (primary) hypertension: Secondary | ICD-10-CM | POA: Diagnosis present

## 2024-03-19 DIAGNOSIS — F4310 Post-traumatic stress disorder, unspecified: Secondary | ICD-10-CM | POA: Diagnosis present

## 2024-03-19 DIAGNOSIS — F32A Depression, unspecified: Secondary | ICD-10-CM | POA: Diagnosis not present

## 2024-03-19 DIAGNOSIS — Z91018 Allergy to other foods: Secondary | ICD-10-CM

## 2024-03-19 DIAGNOSIS — Z7951 Long term (current) use of inhaled steroids: Secondary | ICD-10-CM

## 2024-03-19 DIAGNOSIS — F209 Schizophrenia, unspecified: Secondary | ICD-10-CM | POA: Diagnosis present

## 2024-03-19 DIAGNOSIS — R000 Tachycardia, unspecified: Secondary | ICD-10-CM | POA: Diagnosis present

## 2024-03-19 DIAGNOSIS — F1994 Other psychoactive substance use, unspecified with psychoactive substance-induced mood disorder: Secondary | ICD-10-CM | POA: Diagnosis present

## 2024-03-19 DIAGNOSIS — Z9152 Personal history of nonsuicidal self-harm: Secondary | ICD-10-CM

## 2024-03-19 DIAGNOSIS — F1210 Cannabis abuse, uncomplicated: Secondary | ICD-10-CM | POA: Diagnosis present

## 2024-03-19 DIAGNOSIS — Z91013 Allergy to seafood: Secondary | ICD-10-CM

## 2024-03-19 DIAGNOSIS — E872 Acidosis, unspecified: Secondary | ICD-10-CM | POA: Diagnosis present

## 2024-03-19 DIAGNOSIS — F913 Oppositional defiant disorder: Secondary | ICD-10-CM | POA: Diagnosis present

## 2024-03-19 LAB — RAPID URINE DRUG SCREEN, HOSP PERFORMED
Amphetamines: POSITIVE — AB
Barbiturates: NOT DETECTED
Benzodiazepines: NOT DETECTED
Cocaine: NOT DETECTED
Opiates: NOT DETECTED
Tetrahydrocannabinol: POSITIVE — AB

## 2024-03-19 LAB — CBC WITH DIFFERENTIAL/PLATELET
Abs Immature Granulocytes: 0.02 K/uL (ref 0.00–0.07)
Basophils Absolute: 0 K/uL (ref 0.0–0.1)
Basophils Relative: 0 %
Eosinophils Absolute: 0 K/uL (ref 0.0–0.5)
Eosinophils Relative: 0 %
HCT: 36.9 % (ref 36.0–46.0)
Hemoglobin: 12.7 g/dL (ref 12.0–15.0)
Immature Granulocytes: 0 %
Lymphocytes Relative: 18 %
Lymphs Abs: 1.5 K/uL (ref 0.7–4.0)
MCH: 30.2 pg (ref 26.0–34.0)
MCHC: 34.4 g/dL (ref 30.0–36.0)
MCV: 87.9 fL (ref 80.0–100.0)
Monocytes Absolute: 0.6 K/uL (ref 0.1–1.0)
Monocytes Relative: 8 %
Neutro Abs: 6.2 K/uL (ref 1.7–7.7)
Neutrophils Relative %: 74 %
Platelets: 321 K/uL (ref 150–400)
RBC: 4.2 MIL/uL (ref 3.87–5.11)
RDW: 11.9 % (ref 11.5–15.5)
WBC: 8.3 K/uL (ref 4.0–10.5)
nRBC: 0 % (ref 0.0–0.2)

## 2024-03-19 LAB — URINALYSIS, W/ REFLEX TO CULTURE (INFECTION SUSPECTED)
Bilirubin Urine: NEGATIVE
Glucose, UA: NEGATIVE mg/dL
Hgb urine dipstick: NEGATIVE
Ketones, ur: 5 mg/dL — AB
Leukocytes,Ua: NEGATIVE
Nitrite: NEGATIVE
Protein, ur: NEGATIVE mg/dL
Specific Gravity, Urine: 1.01 (ref 1.005–1.030)
pH: 7 (ref 5.0–8.0)

## 2024-03-19 LAB — COMPREHENSIVE METABOLIC PANEL WITH GFR
ALT: 12 U/L (ref 0–44)
AST: 23 U/L (ref 15–41)
Albumin: 4.2 g/dL (ref 3.5–5.0)
Alkaline Phosphatase: 74 U/L (ref 38–126)
Anion gap: 14 (ref 5–15)
BUN: 11 mg/dL (ref 6–20)
CO2: 21 mmol/L — ABNORMAL LOW (ref 22–32)
Calcium: 9.3 mg/dL (ref 8.9–10.3)
Chloride: 102 mmol/L (ref 98–111)
Creatinine, Ser: 0.57 mg/dL (ref 0.44–1.00)
GFR, Estimated: >60 mL/min (ref >60)
Glucose, Bld: 106 mg/dL — ABNORMAL HIGH (ref 70–99)
Potassium: 3.5 mmol/L (ref 3.5–5.1)
Sodium: 137 mmol/L (ref 135–145)
Total Bilirubin: 0.8 mg/dL (ref 0.0–1.2)
Total Protein: 7.9 g/dL (ref 6.5–8.1)

## 2024-03-19 LAB — I-STAT CG4 LACTIC ACID, ED: Lactic Acid, Venous: 2 mmol/L (ref 0.5–1.9)

## 2024-03-19 LAB — HCG, SERUM, QUALITATIVE: Preg, Serum: NEGATIVE

## 2024-03-19 LAB — ETHANOL: Alcohol, Ethyl (B): <15 mg/dL (ref <15)

## 2024-03-19 LAB — TROPONIN I (HIGH SENSITIVITY)
Troponin I (High Sensitivity): 3 ng/L (ref <18)
Troponin I (High Sensitivity): <2 ng/L (ref <18)

## 2024-03-19 LAB — CBG MONITORING, ED: Glucose-Capillary: 86 mg/dL (ref 70–99)

## 2024-03-19 LAB — PROTIME-INR
INR: 1 (ref 0.8–1.2)
Prothrombin Time: 14.1 s (ref 11.4–15.2)

## 2024-03-19 LAB — APTT: aPTT: 31 s (ref 24–36)

## 2024-03-19 LAB — LIPASE, BLOOD: Lipase: 25 U/L (ref 11–51)

## 2024-03-19 MED ORDER — SODIUM CHLORIDE 0.9 % IV BOLUS
1000.0000 mL | Freq: Once | INTRAVENOUS | Status: AC
  Administered 2024-03-19: 1000 mL via INTRAVENOUS

## 2024-03-19 MED ORDER — LIDOCAINE VISCOUS HCL 2 % MT SOLN
15.0000 mL | Freq: Once | OROMUCOSAL | Status: AC
  Administered 2024-03-19: 15 mL via ORAL
  Filled 2024-03-19: qty 15

## 2024-03-19 MED ORDER — ONDANSETRON HCL 4 MG/2ML IJ SOLN
INTRAMUSCULAR | Status: AC
  Filled 2024-03-19: qty 2

## 2024-03-19 MED ORDER — LORAZEPAM 2 MG/ML IJ SOLN
1.0000 mg | Freq: Once | INTRAMUSCULAR | Status: DC
  Filled 2024-03-19: qty 1

## 2024-03-19 MED ORDER — ONDANSETRON HCL 4 MG/2ML IJ SOLN
4.0000 mg | Freq: Once | INTRAMUSCULAR | Status: AC
  Administered 2024-03-19: 4 mg via INTRAVENOUS
  Filled 2024-03-19: qty 2

## 2024-03-19 MED ORDER — ALUM & MAG HYDROXIDE-SIMETH 200-200-20 MG/5ML PO SUSP
30.0000 mL | Freq: Once | ORAL | Status: AC
  Administered 2024-03-19: 30 mL via ORAL
  Filled 2024-03-19: qty 30

## 2024-03-19 MED ORDER — GADOBUTROL 1 MMOL/ML IV SOLN: 5.0000 mL | Freq: Once | INTRAVENOUS | Status: DC | PRN

## 2024-03-19 MED ORDER — LORAZEPAM 2 MG/ML IJ SOLN
1.0000 mg | INTRAMUSCULAR | Status: DC | PRN
  Administered 2024-03-19 – 2024-03-20 (×2): 1 mg via INTRAVENOUS
  Filled 2024-03-19: qty 1

## 2024-03-19 MED ORDER — SODIUM CHLORIDE 0.9% FLUSH
3.0000 mL | Freq: Once | INTRAVENOUS | Status: AC
  Administered 2024-03-19: 3 mL via INTRAVENOUS

## 2024-03-19 MED ORDER — ACETAMINOPHEN 500 MG PO TABS
500.0000 mg | ORAL_TABLET | Freq: Once | ORAL | Status: AC
  Administered 2024-03-19: 500 mg via ORAL
  Filled 2024-03-19: qty 1

## 2024-03-19 MED ORDER — ONDANSETRON HCL 4 MG/2ML IJ SOLN
4.0000 mg | Freq: Once | INTRAMUSCULAR | Status: AC
  Administered 2024-03-19: 4 mg via INTRAVENOUS

## 2024-03-19 NOTE — Progress Notes (Signed)
 Pt has been accepted to [facility] on 03/19/2024 . Bed assignment:3E   Pt meets inpatient criteria per Charlene Buba, MD   Attending Physician will be Dr. Debby Jumper  Report can be called to: - [phone]  Pt can arrive after 8AM   Care Team Notified: Harlene Smiles, RN

## 2024-03-19 NOTE — ED Provider Notes (Incomplete)
 Care of patient assumed from Dr. Ginger.  This patient presented after a suicide attempt by intentional overdose of ecstasy tablets.  While in the ED, she developed unilateral neurodeficits.  This was discussed with neurology who recommended treating a code stroke.  CT imaging has been negative.  MRI is pending. Physical Exam  BP 135/88   Pulse 82   Temp 98.5 F (36.9 C) (Oral)   Resp 15   Ht 5' 2 (1.575 m)   Wt 54.4 kg   LMP 02/08/2024 (Approximate)   SpO2 100%   BMI 21.95 kg/m   Physical Exam Vitals and nursing note reviewed.  Constitutional:      General: She is not in acute distress.    Appearance: Normal appearance. She is well-developed. She is not toxic-appearing or diaphoretic.  HENT:     Head: Normocephalic and atraumatic.     Right Ear: External ear normal.     Left Ear: External ear normal.     Nose: Nose normal.     Mouth/Throat:     Mouth: Mucous membranes are moist.  Eyes:     Extraocular Movements: Extraocular movements intact.     Conjunctiva/sclera: Conjunctivae normal.  Cardiovascular:     Rate and Rhythm: Regular rhythm. Tachycardia present.  Pulmonary:     Effort: Pulmonary effort is normal. No respiratory distress.  Abdominal:     General: There is no distension.     Palpations: Abdomen is soft.     Tenderness: There is no abdominal tenderness.  Musculoskeletal:        General: No swelling.     Cervical back: Normal range of motion and neck supple.  Skin:    General: Skin is warm and dry.     Capillary Refill: Capillary refill takes less than 2 seconds.  Neurological:     Mental Status: She is alert.  Psychiatric:        Mood and Affect: Mood is anxious. Affect is tearful.        Speech: Speech normal.     Procedures  Procedures  ED Course / MDM    Medical Decision Making Amount and/or Complexity of Data Reviewed Labs: ordered. Radiology: ordered.  Risk OTC drugs. Prescription drug management. Decision regarding  hospitalization.   On assessment, patient is anxious and tearful.  She is tachycardic.  It does appear to be a sinus rhythm.  Her heart rate will fluctuate between 130s and 160s.  Not consistent with SVT.  This may be due to anxiety or drug effect.  Ativan  was ordered.  Patient was previously describing right sided weakness.  She now states that she cannot move her extremities.  She is holding them and rigidity.  When distracted, she will relax them.  MRI showed no acute findings.  Despite IV fluids and 2 mg of Ativan , patient continued to have tachycardia.  It would range from 130s to 170s.  There may be intermittent SVT.  Patient was treated with aggressive benzodiazepines for concern of drug effect or possible serotonin syndrome.  She remained afebrile, CK was normal.  Serotonin syndrome unlikely.  I spoke with poison control who recommended admission for continued supportive care and observation.  Patient's agitation resolved and she would have heart rate come down to the range of 100.  She would then have recurrence of tachycardia into the 180s without agitation.  On monitor, it does appear to be intermittent SVT.  Cardizem gtt. was ordered.  Patient had resolution of her tachycardia on Cardizem.  At 15 mg/h, heart rate is now 90.  I spoke with hospitalist, Dr. Alfornia, who requested attempt to wean off of Cardizem and to medically clear from the ED.  Cardiology was consulted.  Callback pending at time of signout.  Care of patient was signed to oncoming ED provider.  CRITICAL CARE Performed by: Bernardino Fireman   Total critical care time: 32 minutes  Critical care time was exclusive of separately billable procedures and treating other patients.  Critical care was necessary to treat or prevent imminent or life-threatening deterioration.  Critical care was time spent personally by me on the following activities: development of treatment plan with patient and/or surrogate as well as nursing, discussions  with consultants, evaluation of patient's response to treatment, examination of patient, obtaining history from patient or surrogate, ordering and performing treatments and interventions, ordering and review of laboratory studies, ordering and review of radiographic studies, pulse oximetry and re-evaluation of patient's condition.        Fireman Bernardino, MD 03/20/24 TONNIE    Fireman Bernardino, MD 03/20/24 [id number]    Fireman Bernardino, MD 03/20/24 [phone]

## 2024-03-19 NOTE — ED Notes (Signed)
 PT on phone with her dad.

## 2024-03-19 NOTE — ED Notes (Signed)
 Pt to MRI

## 2024-03-19 NOTE — ED Notes (Addendum)
 Jayson at [facility] requesting an update at [phone] for medical clearance of the patient.

## 2024-03-19 NOTE — ED Notes (Signed)
 MRI called to make RN aware that pt is vomiting in MRI. Sent request to dr. Ginger to rx something for nausea.

## 2024-03-19 NOTE — ED Notes (Signed)
 Pt requested that I contact her social worker Helon Hummer [phone]) to notify her that she is here at the hospital. This person did not answer. I did leave a generic voicemail for her to contact this nurse. Did not leave any identifying information for this person other than this nurses name and work number.

## 2024-03-19 NOTE — Consult Note (Addendum)
 NEUROLOGY CONSULT NOTE   Date of service: March 19, 2024 Patient Name: Amy Wall MRN:  [id number] DOB:  2005-06-16 Chief Complaint: R sided weakness Requesting Provider: Tegeler, Lonni PARAS, *  History of Present Illness  Amy Wall is a 18 y.o. female with hx of PTSD, Oppositional defiant disorder, Epilepsy but no seizure in several years, asthma, schizophrenia who is brought in by EMS after an attempt to kill herself with ecstasy pills. Uds is positive for amphetamines, THC. She was given narcan and haldol  with improvement.  In the ED, she got up to use the bathroom and was fine the first time. The second time, she got to the door of her ED room and felt like her R leg was a weak noodle and she fell to the floor. This occurred a little after 7pm. She told her team later and a code stroke was activated for slight R sided weakness.  LKW: 1900 Modified rankin score: 0-Completely asymptomatic and back to baseline post- stroke IV Thrombolysis: not offered, concern for embellishement and therefore emergent MRI brain was obtained and was negative for acute stroke. Symptoms are also too mild to treat. EVT: not offered, no stroke on MRI and symptoms not consistent with LVO.  NIHSS components Score: Comment  1a Level of Conscious 0[x]  1[]  2[]  3[]      1b LOC Questions 0[x]  1[]  2[]       1c LOC Commands 0[x]  1[]  2[]       2 Best Gaze 0[x]  1[]  2[]       3 Visual 0[x]  1[]  2[]  3[]      4 Facial Palsy 0[x]  1[]  2[]  3[]      5a Motor Arm - left 0[x]  1[]  2[]  3[]  4[]  UN[]    5b Motor Arm - Right 0[]  1[]  2[x]  3[]  4[]  UN[]    6a Motor Leg - Left 0[x]  1[]  2[]  3[]  4[]  UN[]    6b Motor Leg - Right 0[]  1[]  2[x]  3[]  4[]  UN[]    7 Limb Ataxia 0[x]  1[]  2[]  UN[]      8 Sensory 0[x]  1[]  2[]  UN[]      9 Best Language 0[x]  1[]  2[]  3[]      10 Dysarthria 0[x]  1[]  2[]  UN[]      11 Extinct. and Inattention 0[x]  1[]  2[]       TOTAL: 4      ROS  Comprehensive ROS performed and pertinent positives  documented in HPI   Past History   Past Medical History:  Diagnosis Date   Anxiety    Asthma    DMDD (disruptive mood dysregulation disorder)    Epilepsy (HCC)    pseuodoseizures per chart   HTN (hypertension)    Major depressive disorder    Oppositional defiant disorder    PTSD (post-traumatic stress disorder)    Schizophrenia (HCC)    Seizures (HCC)     Past Surgical History:  Procedure Laterality Date   BACK SURGERY     CHOLECYSTECTOMY, LAPAROSCOPIC      Family History: Family History  Problem Relation Age of Onset   Allergic rhinitis Father    Eczema Sister    Allergic rhinitis Sister    Colon cancer Neg Hx    Rectal cancer Neg Hx    Stomach cancer Neg Hx    Esophageal cancer Neg Hx     Social History  reports that she has never smoked. She has been exposed to tobacco smoke. She has never used smokeless tobacco. She reports current alcohol use of about 4.0 standard drinks of alcohol per  week. She reports that she does not currently use drugs after having used the following drugs: Marijuana and Methamphetamines.  Allergies  Allergen Reactions   Chocolate Anaphylaxis, Itching, Swelling and Other (See Comments)    Throat closes   Peanut-Containing Drug Products Anaphylaxis, Hives, Itching and Other (See Comments)   Pineapple Anaphylaxis and Hives   Shellfish Allergy Anaphylaxis and Hives   Shellfish Protein-Containing Drug Products Other (See Comments)   Strawberry Extract Anaphylaxis and Hives   Charentais Melon (French Melon) Swelling and Other (See Comments)    Numbness and swelling of throat and tongue, also    Medications   Current Facility-Administered Medications:    sodium chloride  flush (NS) 0.9 % injection 3 mL, 3 mL, Intravenous, Once, Tegeler, Lonni PARAS, MD  Current Outpatient Medications:    albuterol  (VENTOLIN  HFA) 108 (90 Base) MCG/ACT inhaler, Inhale 2 puffs into the lungs every 6 (six) hours as needed for wheezing or shortness of  breath., Disp: 18 g, Rfl: 2   albuterol  (VENTOLIN  HFA) 108 (90 Base) MCG/ACT inhaler, Inhale 1-2 puffs into the lungs every 6 (six) hours as needed for wheezing or shortness of breath., Disp: 2 each, Rfl: 0   ARIPiprazole  (ABILIFY ) 15 MG tablet, Take 1 tablet (15 mg total) by mouth daily., Disp: 30 tablet, Rfl: 0   budesonide -formoterol  (SYMBICORT ) 80-4.5 MCG/ACT inhaler, Inhale 2 puffs into the lungs 2 (two) times daily. (Patient not taking: Reported on 02/14/2024), Disp: , Rfl:    calcium  carbonate (TUMS - DOSED IN MG ELEMENTAL CALCIUM ) 500 MG chewable tablet, Chew 1 tablet (200 mg of elemental calcium  total) by mouth 3 (three) times daily with meals., Disp: 30 tablet, Rfl: 0   cromolyn (OPTICROM) 4 % ophthalmic solution, SMARTSIG:1 Drop(s) In Eye(s) Twice Daily PRN, Disp: , Rfl:    fluticasone  furoate-vilanterol (BREO ELLIPTA ) 100-25 MCG/ACT AEPB, Inhale 1 puff into the lungs daily., Disp: 1 each, Rfl: 0   megestrol  (MEGACE ) 40 MG tablet, Take 1 tablet (40 mg total) by mouth daily., Disp: 30 tablet, Rfl: 0   metroNIDAZOLE  (FLAGYL ) 500 MG tablet, Take 1 tablet (500 mg total) by mouth 2 (two) times daily., Disp: 14 tablet, Rfl: 0   nicotine  polacrilex (NICORETTE ) 2 MG gum, Take 1 each (2 mg total) by mouth as needed for smoking cessation. (Patient not taking: Reported on 02/14/2024), Disp: 100 tablet, Rfl: 0   nystatin cream (MYCOSTATIN), Apply topically., Disp: , Rfl:    pantoprazole  (PROTONIX ) 40 MG tablet, Take 1 tablet (40 mg total) by mouth 2 (two) times daily before a meal., Disp: 60 tablet, Rfl: 0   RESTASIS 0.05 % ophthalmic emulsion, 1 drop 2 (two) times daily., Disp: , Rfl:    sertraline  (ZOLOFT ) 100 MG tablet, Take 1 tablet (100 mg total) by mouth daily., Disp: 30 tablet, Rfl: 0  Vitals   Vitals:   03/19/24 1545 03/19/24 1615 03/19/24 1757 03/19/24 1841  BP: (!) 142/87  (!) 112/90   Pulse: 61 61 66   Resp:  15 (!) 24   Temp:    98 F (36.7 C)  TempSrc:    Oral  SpO2: 100% 100% 100%    Weight:      Height:        Body mass index is 21.95 kg/m.   Physical Exam   General: Laying comfortably in bed; in no acute distress.  HENT: Normal oropharynx and mucosa. Normal external appearance of ears and nose.  Neck: Supple, no pain or tenderness  CV: No JVD. No  peripheral edema.  Pulmonary: Symmetric Chest rise. Normal respiratory effort.  Abdomen: Soft to touch, non-tender.  Ext: No cyanosis, edema, or deformity  Skin: No rash. Normal palpation of skin.   Musculoskeletal: Normal digits and nails by inspection. No clubbing.   Neurologic Examination  Mental status/Cognition: Alert, oriented to self, place, month and year, good attention.  Speech/language: Fluent, comprehension intact, object naming intact, repetition intact.  Cranial nerves:   CN II Pupils equal and reactive to light, no VF deficits    CN III,IV,VI EOM intact, no gaze preference or deviation, no nystagmus    CN V normal sensation in V1, V2, and V3 segments bilaterally    CN VII no asymmetry, no nasolabial fold flattening    CN VIII normal hearing to speech    CN IX & X normal palatal elevation, no uvular deviation   CN XI 5/5 head turn and 5/5 shoulder shrug bilaterally    CN XII midline tongue protrusion    Motor:  Muscle bulk: normal, tone normal Mvmt Root Nerve  Muscle Right Left Comments  SA C5/6 Ax Deltoid 3 5   EF C5/6 Mc Biceps     EE C6/7/8 Rad Triceps     WF C6/7 Med FCR     WE C7/8 PIN ECU     F Ab C8/T1 U ADM/FDI 4 5   HF L1/2/3 Fem Illopsoas 4 5 Giveaway weakness in RUE and RLE.  KE L2/3/4 Fem Quad     DF L4/5 D Peron Tib Ant 4 5   PF S1/2 Tibial Grc/Sol 4 5    Sensation:  Light touch Intact throughout   Pin prick    Temperature    Vibration   Proprioception    Coordination/Complex Motor:  - Finger to Nose intact BL - Heel to shin intact BL - Rapid alternating movement are slowed on the right - Gait: deferred.  Labs/Imaging/Neurodiagnostic studies   CBC:  Recent Labs   Lab 03/19/24 1558  WBC 8.3  NEUTROABS 6.2  HGB 12.7  HCT 36.9  MCV 87.9  PLT 321   Basic Metabolic Panel:  Lab Results  Component Value Date   NA 137 03/19/2024   K 3.5 03/19/2024   CO2 21 (L) 03/19/2024   GLUCOSE 106 (H) 03/19/2024   BUN 11 03/19/2024   CREATININE 0.57 03/19/2024   CALCIUM  9.3 03/19/2024   GFRNONAA >60 03/19/2024   GFRAA NOT CALCULATED 12/12/2019   Lipid Panel:  Lab Results  Component Value Date   LDLCALC 113 (H) 11/02/2023   HgbA1c:  Lab Results  Component Value Date   HGBA1C 4.8 11/02/2023   Urine Drug Screen:     Component Value Date/Time   LABOPIA NONE DETECTED 03/19/2024 1720   COCAINSCRNUR NONE DETECTED 03/19/2024 1720   LABBENZ NONE DETECTED 03/19/2024 1720   AMPHETMU POSITIVE (A) 03/19/2024 1720   THCU POSITIVE (A) 03/19/2024 1720   LABBARB NONE DETECTED 03/19/2024 1720    Alcohol Level     Component Value Date/Time   ETH <15 03/19/2024 1558   INR  Lab Results  Component Value Date   INR 1.2 03/04/2023   APTT No results found for: APTT AED levels: No results found for: PHENYTOIN, ZONISAMIDE, LAMOTRIGINE, LEVETIRACETA  CT Head without contrast(Personally reviewed): CTH was negative for a large hypodensity concerning for a large territory infarct or hyperdensity concerning for an ICH  MRI Brain(Personally reviewed): No acute stroke.  ASSESSMENT   Amy Wall is a 18 y.o. female with hx  of PTSD, Oppositional defiant disorder, Epilepsy but no seizure in several years, asthma, schizophrenia who is brought in by EMS after an attempt to kill herself with ecstasy pills. Uds is positive for amphetamines, THC. She was given narcan and haldol  with improvement.  In the ED, she got up to use the bathroom and was fine the first time. The second time, she got to the door of her ED room and felt like her R leg was a weak noodle and she fell to the floor. This occurred a little after 7pm. She told her team later and a code  stroke was activated for slight R sided weakness.  Exam concerning for a functional etiology of her symptom with giveaway weakness in RUE and RLE and positive hoovers sign.  She was too mild to treat and therefore tnkase and thrombectomy were not offered and instead STAT MRI brain was obtained which was negative for acute stroke.  RECOMMENDATIONS  - MRI Brain with and without contrast and if negative, no further workup. Q30 mins neuro checks until we can get MRI Brain. - MRI brain reviewed and negative for stroke. No further inpatient neuro workup at this time. ______________________________________________________________________  I personally spent a total of 75 minutes in the care of the patient today including preparing to see the patient, getting/reviewing separately obtained history, performing a medically appropriate exam/evaluation, counseling and educating, placing orders, referring and communicating with other health care professionals, documenting clinical information in the EHR, independently interpreting results, communicating results, and coordinating care.   Signed, Sue Fernicola, MD [facility]
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
## 2024-03-19 NOTE — ED Notes (Signed)
 IV team at bedside to place line and draw labs.
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
## 2024-03-19 NOTE — Progress Notes (Signed)
 Inpatient Psychiatric Referral  Patient was recommended inpatient per Charlene Buba, MD  . There are no available beds at [facility], per [facility]. Patient was referred to the following out of network facilities:  Destination  Service Provider Address Phone Fax  [facility]  [address], [place] [address] [phone]  [facility]  [address] [place] [address] [phone]  [facility] EFAX  [address] [place], [place] [phone]  [facility]  [address], [place] [address] [phone]  [facility]  [address] [place], [place] [address] [phone]  [facility]  [address] [place], [place] [address] [phone]  [facility]  [address] [place] [address] [phone]    Situation ongoing, CSW to continue following and update chart as more information becomes available.   Harrie Sofia MSW, LCSWA 03/19/2024
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
## 2024-03-19 NOTE — ED Notes (Addendum)
 Pt's HR in the 160's to 170's and tachypnic. She is stating she feels tingly in the hands and face and can't feel her face. MD aware. Ativan  ordered by MD and given by RN.
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
## 2024-03-19 NOTE — ED Notes (Signed)
 She was found outside by the stairs at her act team office. She admitted to taking ecstasy. She was narcan with a second dose by staff at 1.36 pm and woke up.

## 2024-03-19 NOTE — ED Notes (Signed)
 CCMD called.
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
## 2024-03-20 ENCOUNTER — Encounter (HOSPITAL_COMMUNITY): Payer: Self-pay | Admitting: Hospitalist

## 2024-03-20 DIAGNOSIS — R000 Tachycardia, unspecified: Secondary | ICD-10-CM | POA: Diagnosis not present

## 2024-03-20 LAB — I-STAT CG4 LACTIC ACID, ED
Lactic Acid, Venous: 2 mmol/L (ref 0.5–1.9)
Lactic Acid, Venous: 1.4 mmol/L (ref 0.5–1.9)

## 2024-03-20 LAB — CK: Total CK: 101 U/L (ref 38–234)

## 2024-03-20 LAB — LACTIC ACID, PLASMA: Lactic Acid, Venous: 2.5 mmol/L (ref 0.5–1.9)

## 2024-03-20 MED ORDER — NITROGLYCERIN 0.4 MG SL SUBL
0.4000 mg | SUBLINGUAL_TABLET | SUBLINGUAL | Status: DC | PRN
Start: 2024-03-20 — End: 2024-03-26
  Administered 2024-03-21 (×2): 0.4 mg via SUBLINGUAL
  Filled 2024-03-20: qty 1

## 2024-03-20 MED ORDER — ALUM & MAG HYDROXIDE-SIMETH 200-200-20 MG/5ML PO SUSP: 30.0000 mL | Freq: Once | ORAL | Status: DC

## 2024-03-20 MED ORDER — MORPHINE SULFATE (PF) 2 MG/ML IV SOLN
2.0000 mg | INTRAVENOUS | Status: DC | PRN
  Administered 2024-03-20 – 2024-03-24 (×4): 2 mg via INTRAVENOUS
  Filled 2024-03-20 (×4): qty 1

## 2024-03-20 MED ORDER — ENSURE PLUS HIGH PROTEIN PO LIQD
237.0000 mL | Freq: Two times a day (BID) | ORAL | Status: DC
  Administered 2024-03-21 – 2024-03-26 (×7): 237 mL via ORAL
  Filled 2024-03-20: qty 237

## 2024-03-20 MED ORDER — DILTIAZEM HCL-DEXTROSE 125-5 MG/125ML-% IV SOLN (PREMIX): 5.0000 mg/h | INTRAVENOUS | Status: DC

## 2024-03-20 MED ORDER — METOCLOPRAMIDE HCL 5 MG/ML IJ SOLN
10.0000 mg | Freq: Once | INTRAMUSCULAR | Status: AC
  Administered 2024-03-20: 10 mg via INTRAVENOUS
  Filled 2024-03-20: qty 2

## 2024-03-20 MED ORDER — DIAZEPAM 5 MG/ML IJ SOLN
5.0000 mg | Freq: Once | INTRAMUSCULAR | Status: AC
  Administered 2024-03-20: 5 mg via INTRAVENOUS
  Filled 2024-03-20: qty 2

## 2024-03-20 MED ORDER — ENOXAPARIN SODIUM 40 MG/0.4ML IJ SOSY
40.0000 mg | PREFILLED_SYRINGE | INTRAMUSCULAR | Status: DC
  Administered 2024-03-20: 40 mg via SUBCUTANEOUS
  Filled 2024-03-20 (×2): qty 0.4

## 2024-03-20 MED ORDER — LIDOCAINE VISCOUS HCL 2 % MT SOLN: 15.0000 mL | Freq: Once | OROMUCOSAL | Status: DC

## 2024-03-20 MED ORDER — ACETAMINOPHEN 325 MG PO TABS
650.0000 mg | ORAL_TABLET | ORAL | Status: DC | PRN
  Administered 2024-03-20 – 2024-03-22 (×3): 650 mg via ORAL
  Filled 2024-03-20 (×4): qty 2

## 2024-03-20 MED ORDER — FLUTICASONE FUROATE-VILANTEROL 100-25 MCG/ACT IN AEPB
1.0000 | INHALATION_SPRAY | Freq: Every day | RESPIRATORY_TRACT | Status: DC
  Administered 2024-03-22 – 2024-03-26 (×3): 1 via RESPIRATORY_TRACT
  Filled 2024-03-20 (×4): qty 28

## 2024-03-20 MED ORDER — ARIPIPRAZOLE 5 MG PO TABS
15.0000 mg | ORAL_TABLET | Freq: Every day | ORAL | Status: DC
  Administered 2024-03-20 – 2024-03-26 (×7): 15 mg via ORAL
  Filled 2024-03-20 (×3): qty 1
  Filled 2024-03-20: qty 3
  Filled 2024-03-20: qty 1
  Filled 2024-03-20 (×2): qty 3

## 2024-03-20 MED ORDER — LACTATED RINGERS IV SOLN: INTRAVENOUS | Status: AC

## 2024-03-20 MED ORDER — PANTOPRAZOLE SODIUM 40 MG PO TBEC
40.0000 mg | DELAYED_RELEASE_TABLET | Freq: Two times a day (BID) | ORAL | Status: DC
Start: 2024-03-20 — End: 2024-03-26
  Administered 2024-03-20 – 2024-03-26 (×12): 40 mg via ORAL
  Filled 2024-03-20 (×12): qty 1

## 2024-03-20 MED ORDER — SERTRALINE HCL 100 MG PO TABS
100.0000 mg | ORAL_TABLET | Freq: Every day | ORAL | Status: DC
  Administered 2024-03-20 – 2024-03-25 (×6): 100 mg via ORAL
  Filled 2024-03-20 (×6): qty 1

## 2024-03-20 MED ORDER — DILTIAZEM HCL-DEXTROSE 125-5 MG/125ML-% IV SOLN (PREMIX)
5.0000 mg/h | INTRAVENOUS | Status: DC
  Administered 2024-03-20: 5 mg/h via INTRAVENOUS
  Administered 2024-03-20 – 2024-03-21 (×3): 15 mg/h via INTRAVENOUS
  Filled 2024-03-20 (×5): qty 125

## 2024-03-20 MED ORDER — SODIUM CHLORIDE 0.9 % IV SOLN: INTRAVENOUS | Status: DC

## 2024-03-20 NOTE — ED Notes (Signed)
 Pt began breathing very heavily and having a panic attack. Heart rate rose to between 150-180. MD, Melvenia made aware. Pt coached on how to breath as she was hyperventilating. Medication ordered by provider.

## 2024-03-20 NOTE — ED Notes (Signed)
 Pt MRI cut short as patient was no longer able to tolerate laying down due to headache and nausea. Pt back to room and hooked back to monitor.

## 2024-03-20 NOTE — ED Notes (Signed)
 Pt vomiting at bedside. RN notified EDP for nausea medication.

## 2024-03-20 NOTE — ED Notes (Signed)
 RN spoke with pt about the medication she took. Pt says a provider told her the medication she consumed was mixed with meth.  Pt states she hasn't slept in 5 days.  RN informed pt we will continue to monitor her symptoms and alert the provider if necessary.

## 2024-03-20 NOTE — ED Notes (Signed)
 Pt knife and lighter given to security.  Envelope # [id number]

## 2024-03-20 NOTE — ED Provider Notes (Signed)
  Physical Exam  BP 104/74   Pulse 84   Temp 98.9 F (37.2 C) (Oral)   Resp (!) 21   Ht 5' 2 (1.575 m)   Wt 54.4 kg   LMP 02/08/2024 (Approximate)   SpO2 98%   BMI 21.95 kg/m   Physical Exam Vitals and nursing note reviewed.  Constitutional:      General: She is not in acute distress.    Appearance: She is well-developed.  HENT:     Head: Normocephalic and atraumatic.  Eyes:     Conjunctiva/sclera: Conjunctivae normal.  Cardiovascular:     Rate and Rhythm: Normal rate and regular rhythm.     Heart sounds: No murmur heard. Pulmonary:     Effort: Pulmonary effort is normal. No respiratory distress.     Breath sounds: Normal breath sounds.  Abdominal:     Palpations: Abdomen is soft.     Tenderness: There is no abdominal tenderness.  Musculoskeletal:        General: No swelling.     Cervical back: Neck supple.  Skin:    General: Skin is warm and dry.     Capillary Refill: Capillary refill takes less than 2 seconds.  Neurological:     Mental Status: She is alert.  Psychiatric:        Mood and Affect: Mood normal.     Procedures  Procedures  ED Course / MDM    Medical Decision Making Amount and/or Complexity of Data Reviewed Labs: ordered. Radiology: ordered.  Risk OTC drugs. Prescription drug management. Decision regarding hospitalization.    Patient presented because of overdose.  Possible ecstasy overdose.  Intentional.  Questionable whether not to be opioids involved.  Patient is been flipping in and out of SVT throughout the night.  Been given benzos.  Started on a Cardene drip as well.  Patient to be admitted to medicine         Simon Lavonia SAILOR, MD 03/20/24 [phone]

## 2024-03-20 NOTE — ED Notes (Signed)
 Lactic acid 2.5. MD Melvenia made aware.

## 2024-03-20 NOTE — ED Notes (Signed)
 Case Number: [id number] The patient was IVC'd on 03/20/2024 3 copies located on the clipboard in the orange zone.   1 copy sent to Medical Records 1 copy placed in Magistrate's folder

## 2024-03-20 NOTE — ED Notes (Signed)
 Pt states her parole officer and dss guardian need to be aware if she goes to [facility].  Pt states it may be too far and she doesn't want to get in trouble.

## 2024-03-20 NOTE — ED Notes (Signed)
 Attempted to call floor/floor's charge RN, but both calls were sent to a dial tone and disconnected.

## 2024-03-20 NOTE — Consult Note (Addendum)
 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  The patient was seen in consultation by Dr. Lorriane on 03/19/2024.  He recommended inpatient admission to an acute psychiatric facility for further stabilization.  The patient agreed for involuntary admission and the recommendation was to IVC her if she changes her mind.  However it appears that the patient was IVC in the ED to ensure that she gets up proper treatment and does not change her mind.  The patient was initially quite upset about the fact that she was IVC.  The patient was seen by this writer to provide additional information and reassurance over the plan.  Apparently the patient has already been accepted by [facility] but before she could be transferred, she was admitted to the medical floor for stabilization of her status.  She is currently not yet medically cleared.  Mental status examination: When seen today, the patient was lying in bed getting an IV access.  She is on one-on-one observation and the charge nurse was present.   She is alert oriented and cooperative and fairly pleasant but somewhat frustrated that despite her asking for voluntary admission, she was IVC in the ED. She maintained good eye contact.  She is oriented x 4.  Although she admits to depression and taking the overdose, no active SI/HI/AVH noted currently.  There is no psychomotor retardation or agitation.  She understands that she will remain on the IVC at this time.  She is cognitively intact.  Recommendations:  The patient is already on an IVC and although she is agreeing for a voluntary admission, she is still not medically cleared and her mental/medical status may change or her determination to get help might change.   Patient understands this and seems to be reassured that the IVC was done in her best interest. Once patient is medically cleared and depending on where she is accepted, we will continue to reassess as indicated. The patient will continue on one-on-one observation.  Limited phone privileges at the discretion of the nursing staff. Will continue to follow until she is accepted to a psychiatric facility.   Total Time Spent in Direct Patient Care:  I personally spent 15 minutes on the unit in direct patient care. The direct patient care time included face-to-face time with the patient, reviewing the patient's chart, communicating with other professionals, and coordinating care. Greater than 50% of this time was spent in counseling or coordinating care with the patient regarding goals of hospitalization, psycho-education, and discharge planning needs.  Alek Poncedeleon,MD,MBA,DLFAPA Psychiatrist.
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
## 2024-03-20 NOTE — Plan of Care (Signed)

## 2024-03-20 NOTE — ED Notes (Signed)
 The case number has been recorded in the patient's chart and the e-ivc binder.

## 2024-03-20 NOTE — ED Notes (Signed)
 RN spoke with EDP about pt remaining on normal saline. Dr. Melvenia agreed to keep pt on normal saline at this time and hold the LR.

## 2024-03-20 NOTE — ED Notes (Signed)
 Pt states the valium didn't really help. She states she feels the same.

## 2024-03-21 DIAGNOSIS — R000 Tachycardia, unspecified: Secondary | ICD-10-CM | POA: Diagnosis not present

## 2024-03-21 DIAGNOSIS — T43642A Poisoning by ecstasy, intentional self-harm, initial encounter: Secondary | ICD-10-CM | POA: Diagnosis not present

## 2024-03-21 LAB — TROPONIN I (HIGH SENSITIVITY)
Troponin I (High Sensitivity): 12 ng/L (ref <18)
Troponin I (High Sensitivity): 9 ng/L (ref <18)

## 2024-03-21 LAB — BASIC METABOLIC PANEL WITH GFR
Anion gap: 13 (ref 5–15)
BUN: 11 mg/dL (ref 6–20)
CO2: 18 mmol/L — ABNORMAL LOW (ref 22–32)
Calcium: 8.5 mg/dL — ABNORMAL LOW (ref 8.9–10.3)
Chloride: 109 mmol/L (ref 98–111)
Creatinine, Ser: 0.58 mg/dL (ref 0.44–1.00)
GFR, Estimated: >60 mL/min (ref >60)
Glucose, Bld: 77 mg/dL (ref 70–99)
Potassium: 3.3 mmol/L — ABNORMAL LOW (ref 3.5–5.1)
Sodium: 140 mmol/L (ref 135–145)

## 2024-03-21 LAB — HIV ANTIBODY (ROUTINE TESTING W REFLEX): HIV Screen 4th Generation wRfx: NONREACTIVE

## 2024-03-21 MED ORDER — TRAZODONE HCL 50 MG PO TABS
25.0000 mg | ORAL_TABLET | Freq: Every evening | ORAL | Status: DC | PRN
Start: 2024-03-21 — End: 2024-03-26
  Administered 2024-03-21 – 2024-03-25 (×2): 25 mg via ORAL
  Filled 2024-03-21 (×2): qty 1

## 2024-03-21 MED ORDER — LORAZEPAM 2 MG/ML IJ SOLN
0.5000 mg | INTRAMUSCULAR | Status: DC | PRN
Start: 2024-03-21 — End: 2024-03-26
  Administered 2024-03-21 – 2024-03-23 (×5): 0.5 mg via INTRAVENOUS
  Filled 2024-03-21 (×5): qty 1

## 2024-03-21 MED ORDER — LORAZEPAM 2 MG/ML IJ SOLN
1.0000 mg | Freq: Once | INTRAMUSCULAR | Status: AC
  Administered 2024-03-21: 1 mg via INTRAVENOUS

## 2024-03-21 MED ORDER — LORAZEPAM 2 MG/ML IJ SOLN: 1.0000 mg | Freq: Once | INTRAMUSCULAR | Status: DC

## 2024-03-21 MED ORDER — LORAZEPAM 0.5 MG PO TABS
0.5000 mg | ORAL_TABLET | ORAL | Status: DC | PRN
  Administered 2024-03-21 – 2024-03-24 (×5): 0.5 mg via ORAL
  Filled 2024-03-21 (×7): qty 1

## 2024-03-21 MED ORDER — POTASSIUM CHLORIDE CRYS ER 20 MEQ PO TBCR
40.0000 meq | EXTENDED_RELEASE_TABLET | Freq: Once | ORAL | Status: DC
  Filled 2024-03-21: qty 2

## 2024-03-21 MED ORDER — ONDANSETRON HCL 4 MG/2ML IJ SOLN
4.0000 mg | INTRAMUSCULAR | Status: DC | PRN
  Administered 2024-03-21 – 2024-03-24 (×4): 4 mg via INTRAVENOUS
  Filled 2024-03-21 (×5): qty 2

## 2024-03-21 MED ORDER — LORAZEPAM 2 MG/ML IJ SOLN
INTRAMUSCULAR | Status: AC
  Filled 2024-03-21: qty 1

## 2024-03-21 MED ORDER — KETOROLAC TROMETHAMINE 15 MG/ML IJ SOLN
30.0000 mg | Freq: Once | INTRAMUSCULAR | Status: AC
  Administered 2024-03-21: 30 mg via INTRAVENOUS
  Filled 2024-03-21: qty 2

## 2024-03-21 MED ORDER — SODIUM CHLORIDE 0.9 % IV SOLN: INTRAVENOUS | Status: AC

## 2024-03-21 NOTE — Progress Notes (Signed)
 Patient having 10/10 headache and said Tylenol  did not work. I wrote Dr. Lawence who gave me Tylenol  650 mg Q4 and Morphine  injection 2 mg Q4.    Paralee from [employer] called after secure chatting with Dr. Lawence. She asked about pt's latest labs and vitals. She said the Morphine  might be good to calm the patient while helping her with her pain. She explained different things that can happen after taking the substance the patient had taken. I decided against starting with the Tylenol  and gave the patient the morphine .    I rechecked the patient later around 2200. I had to wake the patient up. Patient stated she was having 8 to 9/10 chest pain like someone was stabbing her. I  asked if she had mentioned this to the sitter or the other nurse that had been in earlier. Patient stated she did not want to open her eyes.    I messaged Dr. Lawence again and got fresh vitals and an EKG. I gave patient the Tylenol  as she said her headache had not gone away. Dr. Lawence had me hold off on the Nitroglycerin SL tablet since patient's BP was borderline. I told patient if she still was hurting we could give the Morphine  again at 0030. Patient had fallen asleep and did not complain of pain or ask for pain medication after that.     At about 0140 patient had gotten up with the sitter to go to the bathroom. I was checking on her when she came out of bathroom complaining she couldn't breathe. I helped her get to the bed to put the oxygen back on when she almost fell. I caught the patient with the help of the sitter  we got her back into bed. I put the oxygen back on. Patient's HR went into the 140's and she started crying and getting very upset and vomited.    Christina the charge nurse came in to talk to the patient and try to calm her down. I messaged Dr. Lawence again. Patient was given Morphine  2 mg injection again, Ativan  0.5 mg injection and Zofran  4 mg injection.     Patient finally calmed down and fell asleep. I checked on  patient and she said her pain was better and she felt calmer than before and wanted to rest. Will continue to monitor.

## 2024-03-21 NOTE — Hospital Course (Addendum)
 Brief Narrative:    18 y.o. female with medical history significant of MDD, PTSD, HTN, and GERD p/w suicide attempt c/b sinus tachycardia after methamphetamine ingestion.  Upon admission noted to have sinus tachycardia, UDS positive for amphetamine /THC.  MRI negative for stroke.  Psychiatry team was consulted.  Initially patient had to be started on Cardizem drip but overnight her heart rate improved and Cardizem drip was weaned off.  Today she is medically feeling much better.  Assessment & Plan:   Sinus tachycardia secondary to polysubstance abuse Hypokalemia Lactic acidosis, resolved Patient ingested methamphetamine.  UDS is also positive for cocaine.  Poison control has been notified.  MRI brain and CT head is negative, alcohol level negative, UA is negative, CK levels are normal.  Cardizem drip has been turned off and medically she is feeling much better.  Heart rate remains stable in 70s along with blood pressure.  Hypokalemia - As needed repletion   MDD/PTSD Concerns of suicide attempt -Seen by psychiatry.  Currently on Zoloft  and Abilify .  Patient will require inpatient psych admission   GERD Continue PPI twice daily  Patient is medically cleared for inpatient psychiatry admission    DVT prophylaxis:Lovenox; refusing.     Code Status: Full Code Family Communication:   Status is: Inpatient Remains inpatient appropriate because: MEDICALLY CLEARED FOR INPTN PSYCH   PT Follow up Recs:   Subjective:  No complaints feeling well Examination:  General exam: Appears calm and comfortable  Respiratory system: Clear to auscultation. Respiratory effort normal. Cardiovascular system: S1 & S2 heard, RRR. No JVD, murmurs, rubs, gallops or clicks. No pedal edema. Gastrointestinal system: Abdomen is nondistended, soft and nontender. No organomegaly or masses felt. Normal bowel sounds heard. Central nervous system: Alert and oriented. No focal neurological deficits. Extremities:  Symmetric 5 x 5 power. Skin: No rashes, lesions or ulcers Psychiatry: Judgement and insight appear normal. Mood & affect appropriate.

## 2024-03-21 NOTE — ED Notes (Addendum)
 With this RN in the rm patient slid to floor when she attempted to ambulate from bed to restroom. Fall risk assessment showed low fall risk at the time and pt stated she felt able to stand and ambulate. She ambulated to bathroom without incident earlier in day.  Ashley Ellwanger RN unhooked pt from leads so she could go to restroom. Harlene SAUNDERS, RN in room while patient stood up and witnessed fall as patient suddenly slid to ground, RN attempted to assist with slide to floor. Pt reported sudden leg weakness on right side. Pt was moved to bed and MD Dr. Ginger made aware of fall. No injuries reported by pt or seen by MD. MD called code stroke post fall due to pt presentation of R sided weakness and due to pt's report of sudden leg weakness. Pt fall risk changed from low risk to high risk.High fall risk bundle implemented and patient under continued observation. Pt verbalized headache prior to fall, no complaint of dizziness.
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
## 2024-03-21 NOTE — Progress Notes (Signed)
 PROGRESS NOTE    Amy Wall  FMW:[id number] DOB: Sep 30, 2005 DOA: 03/19/2024 PCP: Pcp, No    Brief Narrative:    18 y.o. female with medical history significant of MDD, PTSD, HTN, and GERD p/w suicide attempt c/b sinus tachycardia after methamphetamine ingestion.  Upon admission noted to have sinus tachycardia, UDS positive for amphetamine /THC.  MRI negative for stroke.  Psychiatry team was consulted.  Initially patient had to be started on Cardizem drip but overnight her heart rate improved and Cardizem drip was weaned off.  Today she is medically feeling much better.  Assessment & Plan:   Sinus tachycardia secondary to polysubstance abuse Hypokalemia Lactic acidosis, resolved Patient ingested methamphetamine.  UDS is also positive for cocaine.  Poison control has been notified.  MRI brain and CT head is negative, alcohol level negative, UA is negative, CK levels are normal.  Cardizem drip has been turned off and medically she is feeling much better.  Heart rate remains stable in 70s along with blood pressure.  Hypokalemia - As needed repletion   MDD/PTSD Concerns of suicide attempt -Seen by psychiatry.  Currently on Zoloft  and Abilify .  Patient will require inpatient psych admission   GERD Continue PPI twice daily  Patient is medically cleared for inpatient psychiatry admission    DVT prophylaxis:Lovenox    Code Status: Full Code Family Communication:   Status is: Inpatient Remains inpatient appropriate because: Inptn Psych once medically cleared.    PT Follow up Recs:   Subjective:  Feeling well no complaints.  Cardizem drip turned off and heart rate has remained in 70s. Refusing p.o. potassium, agreeable to eat extra bananas.  Examination:  General exam: Appears calm and comfortable  Respiratory system: Clear to auscultation. Respiratory effort normal. Cardiovascular system: S1 & S2 heard, RRR. No JVD, murmurs, rubs, gallops or clicks. No pedal  edema. Gastrointestinal system: Abdomen is nondistended, soft and nontender. No organomegaly or masses felt. Normal bowel sounds heard. Central nervous system: Alert and oriented. No focal neurological deficits. Extremities: Symmetric 5 x 5 power. Skin: No rashes, lesions or ulcers Psychiatry: Judgement and insight appear normal. Mood & affect appropriate.                Diet Orders (From admission, onward)     Start     Ordered   03/20/24 0724  Diet regular Room service appropriate? Yes; Fluid consistency: Thin  Diet effective now       Question Answer Comment  Room service appropriate? Yes   Fluid consistency: Thin      03/20/24 0723            Objective: Vitals:   03/21/24 0900 03/21/24 0930 03/21/24 1000 03/21/24 1030  BP:      Pulse: 78 68 69 72  Resp:      Temp:      TempSrc:      SpO2: 95% 100% 100% 100%  Weight:      Height:        Intake/Output Summary (Last 24 hours) at 03/21/2024 1130 Last data filed at 03/21/2024 1100 Gross per 24 hour  Intake 2429.67 ml  Output --  Net 2429.67 ml   Filed Weights   03/19/24 1446  Weight: 54.4 kg    Scheduled Meds:  alum & mag hydroxide-simeth  30 mL Oral Once   And   lidocaine   15 mL Oral Once   ARIPiprazole   15 mg Oral Daily   enoxaparin (LOVENOX) injection  40 mg Subcutaneous Q24H  feeding supplement  237 mL Oral BID BM   fluticasone  furoate-vilanterol  1 puff Inhalation Daily   LORazepam   1 mg Intravenous Once   pantoprazole   40 mg Oral BID AC   potassium chloride   40 mEq Oral Once   sertraline   100 mg Oral Daily   Continuous Infusions:  sodium chloride  100 mL/hr at 03/21/24 1100   diltiazem (CARDIZEM) infusion Stopped (03/21/24 1008)    Nutritional status     Body mass index is 21.95 kg/m.  Data Reviewed:   CBC: Recent Labs  Lab 03/19/24 1558  WBC 8.3  NEUTROABS 6.2  HGB 12.7  HCT 36.9  MCV 87.9  PLT 321   Basic Metabolic Panel: Recent Labs  Lab 03/19/24 1558  03/21/24 0431  NA 137 140  K 3.5 3.3*  CL 102 109  CO2 21* 18*  GLUCOSE 106* 77  BUN 11 11  CREATININE 0.57 0.58  CALCIUM  9.3 8.5*   GFR: Estimated Creatinine Clearance: 89.5 mL/min (by C-G formula based on SCr of 0.58 mg/dL). Liver Function Tests: Recent Labs  Lab 03/19/24 1558  AST 23  ALT 12  ALKPHOS 74  BILITOT 0.8  PROT 7.9  ALBUMIN 4.2   Recent Labs  Lab 03/19/24 1558  LIPASE 25   No results for input(s): AMMONIA in the last 168 hours. Coagulation Profile: Recent Labs  Lab 03/19/24 2045  INR 1.0   Cardiac Enzymes: Recent Labs  Lab 03/20/24 0140  CKTOTAL 101   BNP (last 3 results) No results for input(s): PROBNP in the last 8760 hours. HbA1C: No results for input(s): HGBA1C in the last 72 hours. CBG: Recent Labs  Lab 03/19/24 2044  GLUCAP 86   Lipid Profile: No results for input(s): CHOL, HDL, LDLCALC, TRIG, CHOLHDL, LDLDIRECT in the last 72 hours. Thyroid  Function Tests: No results for input(s): TSH, T4TOTAL, FREET4, T3FREE, THYROIDAB in the last 72 hours. Anemia Panel: No results for input(s): VITAMINB12, FOLATE, FERRITIN, TIBC, IRON , RETICCTPCT in the last 72 hours. Sepsis Labs: Recent Labs  Lab 03/19/24 1841 03/20/24 0140 03/20/24 0354 03/20/24 0646  LATICACIDVEN 2.0* 2.5* 2.0* 1.4    No results found for this or any previous visit (from the past 240 hours).       Radiology Studies: MR BRAIN WO CONTRAST Result Date: 03/19/2024 CLINICAL DATA:  Initial evaluation for mental status change, unknown cause. EXAM: MRI HEAD WITHOUT CONTRAST TECHNIQUE: Multiplanar, multiecho pulse sequences of the brain and surrounding structures were obtained without intravenous contrast. COMPARISON:  CT from earlier the same day as well as previous MRI from 01/25/2020 FINDINGS: Brain: Examination somewhat technically limited by susceptibility artifact from dental hardware. Cerebral volume within normal limits. No  focal parenchymal signal abnormality. No evidence for acute or subacute infarct. Gray-white matter differentiation maintained. No areas of chronic cortical infarction or other insult. No visible acute or chronic intracranial blood products. No mass lesion, midline shift or mass effect. No hydrocephalus or extra-axial fluid collection. Pituitary gland and suprasellar region within normal limits. No intrinsic temporal lobe abnormality. Vascular: Major intracranial vascular flow voids are maintained. Skull and upper cervical spine: Craniocervical junction within normal limits. Bone marrow signal intensity within normal limits for age. No scalp soft tissue abnormality. Sinuses/Orbits: Globes orbital soft tissues within normal limits. Paranasal sinuses are clear. No mastoid effusion. Other: None. IMPRESSION: Normal brain MRI. No acute intracranial abnormality identified. Electronically Signed   By: Morene Hoard M.D.   On: 03/19/2024 23:37   CT HEAD CODE STROKE WO CONTRAST  Result Date: 03/19/2024 EXAM: CT HEAD WITHOUT CONTRAST 03/19/2024 07:43:58 PM TECHNIQUE: CT of the head was performed without the administration of intravenous contrast. Automated exposure control, iterative reconstruction, and/or weight based adjustment of the mA/kV was utilized to reduce the radiation dose to as low as reasonably achievable. COMPARISON: MRI head 01/25/2020 CLINICAL HISTORY: Neuro deficit, acute, stroke suspected FINDINGS: BRAIN AND VENTRICLES: No acute hemorrhage. No evidence of acute infarct. No hydrocephalus. No extra-axial collection. No mass effect or midline shift. ORBITS: No acute abnormality. SINUSES: No acute abnormality. SOFT TISSUES AND SKULL: No acute soft tissue abnormality. No skull fracture. [place] Stroke Program Early CT (ASPECT) Score: Ganglionic (caudate, IC, lentiform nucleus, insula, M1-M3): 7 Supraganglionic (M4-M6): 3 Total: 10 IMPRESSION: 1. No acute intracranial abnormality. 2. ASPECTS 10. 3.  Findings messaged to Dr. Vanessa at 7:57 PM on 03/19/24. Electronically signed by: Donnice Mania MD 03/19/2024 07:57 PM EST RP Workstation: [id number]           LOS: 1 day   Time spent= 35 mins    Burgess JAYSON Dare, MD [facility]  If 7PM-7AM, please contact night-coverage  03/21/2024, 11:30 AM

## 2024-03-21 NOTE — Discharge Instructions (Addendum)
 Outpatient Substance Use Treatment Services       [facility]   Chemical Dependence Intensive Outpatient Program  [address]  [place], [place] [address]   [phone]  Private insurance, Medicare A&B, and [facility]       [facility] ([facility])   [address],   [place], [place] [address]  [phone]  Medicaid, Self Pay     [facility]        [address]   [place], [place]  [phone]  Medicaid and [facility], Self Pay       [facility]  [address]   [place], [place]   [phone]  [facility], and Self Pay   [facility]       [address]     [place], [place] [address]   [phone] or [phone]  Private Insurance Only     [facility]   [address]   [place], [place]  [address]  [phone]  Medicaid, Medicare, Private Insurance      [facility] Services at [facility]   [address]   [place], [place] [address]  [phone]  Services are free or reduced      [facility]    [address]  [phone]   Self Pay only, sliding scale   [facility]    [address]   [place], [place] [address]  [phone]   ([facility])   Self Pay, Medicaid Only       [facility]   [address], [place] [address]  [phone]  Medicaid, Medicare, Private Insurance      Adolescent Substance Use Treatment Services     [facility]   [address]   [place], [place]   [phone]  Self Pay  Offer scholarships from the [facility] to help pay for treatment   Website: [email or web address].lambert.[email or web address]/      [facility]   Adolescent Substance use Program  Males ages: 67-17  Adolescent Substance use Program  Females: 12-17      [facility]   [address]  [place], [place] [address]  (ph) [phone]  (fax) [phone]  [facility]   [address]  [place], [place] [address]  (ph) [phone]  (fax) [phone]      [facility]   [address]  [place], [place] [address]  (ph) [phone]  (fax) [phone]    [facility]   [address], [place], [place] [address]  (ph) [phone]   (fax) [phone]  Website: [email or web address]      [facility]   Substance Abuse Intensive Outpatient Program for Adolescents  Phone: [phone]  Address: [address], [place], [place]  Website: [email or web address].info/         Residential Substance Use Adult Nurse ([facility]   [address]   [place], [place] [address]   [phone] or [phone]  Detox (Medicare, Medicaid, private insurance, and self pay)   Residential Rehab 14 days (Medicare, Medicaid, private insurance, and self pay)       [facility] ([facility]idential [facility])   [address] [place], [place]   [phone]   Female and  Female Detox (Self Pay and Medicaid limited availability)   Rehab only Female (Medicaid and self pay only)       [facility] [address]       [address]   [place], [place] [address]   [phone] or [phone]  Detox and Residential Treatment  Private Insurance Only       [facility]   [address]   [place], [place] [address]   [phone]   Treatment Only, must make assessment appointment, and must be sober for assessment appointment.   Self Pay Only, Medicare A&B, [facility] [place] ID only!  *Transportation assistance offered from [place] [facility] [employer]       [address]  [place], [place] [address]  Walk in interviews M-Sat 8-4p  No pending legal charges   [phone]   [facility]:  [facility]  Referral   [address]  [place], [place]  [phone]  (Self Pay, [facility])    [facility]  [address]  [place], [place] [address]  [phone]  Detox and Residential Treatment  Medicare and Private Insurance      [facility]   [address]   [place], [place] [address]  28 Day Women's Facility: [phone]  28 Day Men's Facility: [phone]  Long-term Residential Program:   [phone] Males 25 and Over  (No Insurance, upfront fee)      [facility]    [address]  [place], [place] [address]  [phone]  Private Insurance with [employer], Private Pay     [facility]   [address]  [place], [place] [address]  Local?[phone]  Private Insurance Only      [facility]   [address]   [place], [place] [address]   [phone]  (Males, upfront fee)      [facility]   [address]   [place], [address]  [phone]  Private Insurance     [place]  [facility] Locations      [facility]    [address]   [place], [place]   [phone]  [place] Based Program for individuals experiencing homelessness  Self Pay, No insurance    [facility]    Men's program: [facility]  [address] [place], [place] [address]  [phone]      [facility]   Women's program: [facility]  [address]  [place], [place] [address]  [phone]  Christian Based Program for individuals experiencing homelessness  Self Pay, No insurance   [facility]  [address] [place], [place] [address]   [phone]  [place] Based Program for individuals experiencing homelessness  Self Pay, No insurance   [facility]   [address] [place], [place] [address]  [phone]   Crisis Mobile: [facility]: [phone] (for crisis response 24 hours a day)    [facility] [phone]

## 2024-03-21 NOTE — TOC Progression Note (Addendum)
 Transition of Care [facility]) - Progression Note    Patient Details  Name: Amy Wall MRN: [id number] Date of Birth: May 18, 2005  [facility]) CM/SW Contact  Isaiah Public, LCSWA Phone Number: 03/21/2024, 10:59 AM  Clinical Narrative:      CSW following to fax patient out for inpatient psych once patient is medically cleared by MD. CSW will continue to follow.   Update- MD informed CSW that patient medically cleared for dc. CSW will refax patient out for inpatient psych.  Update- CSW faxed patient out for inpatient psych placement.CSW spoke with [name] and provided update on inpatient psych placement. Patients legal guardian informed CSW that PTA patient was living in a hotel alone. All questions answered. No further questions reported at this time. CSW attached outpatient substance use treatment services resources to patients AVS.                 Expected Discharge Plan and Services                                               Social Drivers of Health (SDOH) Interventions SDOH Screenings   Food Insecurity: No Food Insecurity (03/20/2024)  Housing: Low Risk  (03/20/2024)  Transportation Needs: Unmet Transportation Needs (03/20/2024)  Utilities: Not At Risk (03/20/2024)  Alcohol Screen: Low Risk  (11/03/2023)  Depression (PHQ2-9): Low Risk  (10/20/2023)  Financial Resource Strain: Medium Risk (06/28/2022)  Physical Activity: Inactive (06/28/2022)  Social Connections: Socially Isolated (03/20/2024)  Stress: No Stress Concern Present (06/28/2022)  Tobacco Use: Medium Risk (03/20/2024)    Readmission Risk Interventions     No data to display

## 2024-03-21 NOTE — Consult Note (Signed)
 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  The patient was seen in consultation by Dr. Lorriane on 03/19/2024.  He recommended inpatient admission to an acute psychiatric facility for further stabilization.  The patient agreed for involuntary admission and the recommendation was to IVC her if she changes her mind.  However it appears that the patient was IVC in the ED to ensure that she gets up proper treatment and does not change her mind.  The patient was initially quite upset about the fact that she was IVC.  The patient was seen by this writer to provide additional information and reassurance over the plan.  Apparently the patient has already been accepted by [facility] but before she could be transferred, she was admitted to the medical floor for stabilization of her status.  She is currently not yet medically cleared.   03/20/2024:  Mental status examination: When seen today, the patient was lying in bed getting an IV access.  She is on one-on-one observation and the charge nurse was present.   She is alert oriented and cooperative and fairly pleasant but somewhat frustrated that despite her asking for voluntary admission, she was IVC in the ED. She maintained good eye contact.  She is oriented x 4.  Although she admits to depression and taking the overdose, no active SI/HI/AVH noted currently.  There is no psychomotor retardation or agitation.  She understands that she will remain on the IVC at this time.  She is cognitively intact.  03/21/2024: The patient was seen and reevaluated today.  The chart was reviewed.  Received information that the patient is medically cleared today.  When examined she was reporting that she had significant conflicts  prior to admission when an ex-boyfriend was released from jail have apparently been threatening.  She got very frustrated and apparently texted that she is rather kill herself than someone else out.  On examination today the patient is alert oriented and cooperative.  Her affect is dysthymic her speech is coherent without any obvious looseness of association or flight of ideas or tangentiality.  Although she continues to contract for safety she reports that she had relapsed and this was the first time she had actually hurt herself after being able to prevent herself from cutting for over a year.  While in the hospital she is contracting for safety.  Recommendations:  The patient is already on an IVC and although she is agreeing for a voluntary admission, she is still not medically cleared and her mental/medical status may change or her determination to get help might change.  Patient understands this and seems to be reassured that the IVC was done in her best interest. The patient is medically cleared and will refer to the [facility] for a psychiatric bed. The patient will continue on one-on-one observation.  Limited phone privileges at the discretion of the nursing staff. Will continue to follow until she is accepted to a psychiatric facility.    Total Time Spent in Direct Patient Care:  I personally spent 15 minutes on the unit in direct patient care. The direct patient care time included face-to-face time with the patient, reviewing the patient's chart, communicating with other professionals, and coordinating care. Greater than 50% of this time was spent in counseling or coordinating care with the patient regarding goals of  hospitalization, psycho-education, and discharge planning needs.  Amaris Delafuente,MD,MBA,DLFAPA Psychiatrist.

## 2024-03-22 DIAGNOSIS — T43642A Poisoning by ecstasy, intentional self-harm, initial encounter: Secondary | ICD-10-CM | POA: Diagnosis not present

## 2024-03-22 DIAGNOSIS — R000 Tachycardia, unspecified: Secondary | ICD-10-CM | POA: Diagnosis not present

## 2024-03-22 LAB — SARS CORONAVIRUS 2 BY RT PCR: SARS Coronavirus 2 by RT PCR: NEGATIVE

## 2024-03-22 LAB — HCG, SERUM, QUALITATIVE: Preg, Serum: NEGATIVE

## 2024-03-22 LAB — GLUCOSE, CAPILLARY: Glucose-Capillary: 79 mg/dL (ref 70–99)

## 2024-03-22 MED ORDER — LORAZEPAM 2 MG/ML IJ SOLN: 1.0000 mg | Freq: Once | INTRAMUSCULAR | Status: DC

## 2024-03-22 MED ORDER — LORAZEPAM 2 MG/ML IJ SOLN: 1.0000 mg | INTRAMUSCULAR | Status: AC | PRN

## 2024-03-22 NOTE — Progress Notes (Signed)
 Patient inquired if family member/Godmother would be able to come visit patient.     MD Caleen and MD Cornelius advised yes as long as patient would like to see visitors they are allowed to visit but safety sitter must remain in place during visit.

## 2024-03-22 NOTE — Progress Notes (Signed)
 PROGRESS NOTE    Amy Wall  FMW:[id number] DOB: 01-23-2006 DOA: 03/19/2024 PCP: Pcp, No    Brief Narrative:    18 y.o. female with medical history significant of MDD, PTSD, HTN, and GERD p/w suicide attempt c/b sinus tachycardia after methamphetamine ingestion.  Upon admission noted to have sinus tachycardia, UDS positive for amphetamine /THC.  MRI negative for stroke.  Psychiatry team was consulted.  Initially patient had to be started on Cardizem drip but overnight her heart rate improved and Cardizem drip was weaned off.  Today she is medically feeling much better.  Assessment & Plan:   Sinus tachycardia secondary to polysubstance abuse Hypokalemia Lactic acidosis, resolved Patient ingested methamphetamine.  UDS is also positive for cocaine.  Poison control has been notified.  MRI brain and CT head is negative, alcohol level negative, UA is negative, CK levels are normal.  Cardizem drip has been turned off and medically she is feeling much better.  Heart rate remains stable in 70s along with blood pressure.  Hypokalemia - As needed repletion   MDD/PTSD Concerns of suicide attempt -Seen by psychiatry.  Currently on Zoloft  and Abilify .  Patient will require inpatient psych admission   GERD Continue PPI twice daily  Patient is medically cleared for inpatient psychiatry admission    DVT prophylaxis:Lovenox; refusing.     Code Status: Full Code Family Communication:   Status is: Inpatient Remains inpatient appropriate because: MEDICALLY CLEARED FOR INPTN PSYCH   PT Follow up Recs:   Subjective:  No complaints feeling well Examination:  General exam: Appears calm and comfortable  Respiratory system: Clear to auscultation. Respiratory effort normal. Cardiovascular system: S1 & S2 heard, RRR. No JVD, murmurs, rubs, gallops or clicks. No pedal edema. Gastrointestinal system: Abdomen is nondistended, soft and nontender. No organomegaly or masses felt. Normal bowel  sounds heard. Central nervous system: Alert and oriented. No focal neurological deficits. Extremities: Symmetric 5 x 5 power. Skin: No rashes, lesions or ulcers Psychiatry: Judgement and insight appear normal. Mood & affect appropriate.                Diet Orders (From admission, onward)     Start     Ordered   03/20/24 0724  Diet regular Room service appropriate? Yes; Fluid consistency: Thin  Diet effective now       Question Answer Comment  Room service appropriate? Yes   Fluid consistency: Thin      03/20/24 0723            Objective: Vitals:   03/22/24 0147 03/22/24 0150 03/22/24 0614 03/22/24 0722  BP:  100/68 (!) 104/59   Pulse:  63 61   Resp:  14    Temp: 98.9 F (37.2 C) 98.9 F (37.2 C) 97.6 F (36.4 C)   TempSrc: Oral Oral Axillary   SpO2:  100% 100% 98%  Weight:      Height:        Intake/Output Summary (Last 24 hours) at 03/22/2024 1107 Last data filed at 03/22/2024 1000 Gross per 24 hour  Intake 2931.31 ml  Output --  Net 2931.31 ml   Filed Weights   03/19/24 1446  Weight: 54.4 kg    Scheduled Meds:  alum & mag hydroxide-simeth  30 mL Oral Once   And   lidocaine   15 mL Oral Once   ARIPiprazole   15 mg Oral Daily   feeding supplement  237 mL Oral BID BM   fluticasone  furoate-vilanterol  1 puff Inhalation Daily  pantoprazole   40 mg Oral BID AC   potassium chloride   40 mEq Oral Once   sertraline   100 mg Oral Daily   Continuous Infusions:  sodium chloride  Stopped (03/22/24 0959)   diltiazem (CARDIZEM) infusion Stopped (03/21/24 1008)    Nutritional status     Body mass index is 21.95 kg/m.  Data Reviewed:   CBC: Recent Labs  Lab 03/19/24 1558  WBC 8.3  NEUTROABS 6.2  HGB 12.7  HCT 36.9  MCV 87.9  PLT 321   Basic Metabolic Panel: Recent Labs  Lab 03/19/24 1558 03/21/24 0431  NA 137 140  K 3.5 3.3*  CL 102 109  CO2 21* 18*  GLUCOSE 106* 77  BUN 11 11  CREATININE 0.57 0.58  CALCIUM  9.3 8.5*    GFR: Estimated Creatinine Clearance: 89.5 mL/min (by C-G formula based on SCr of 0.58 mg/dL). Liver Function Tests: Recent Labs  Lab 03/19/24 1558  AST 23  ALT 12  ALKPHOS 74  BILITOT 0.8  PROT 7.9  ALBUMIN 4.2   Recent Labs  Lab 03/19/24 1558  LIPASE 25   No results for input(s): AMMONIA in the last 168 hours. Coagulation Profile: Recent Labs  Lab 03/19/24 2045  INR 1.0   Cardiac Enzymes: Recent Labs  Lab 03/20/24 0140  CKTOTAL 101   BNP (last 3 results) No results for input(s): PROBNP in the last 8760 hours. HbA1C: No results for input(s): HGBA1C in the last 72 hours. CBG: Recent Labs  Lab 03/19/24 2044  GLUCAP 86   Lipid Profile: No results for input(s): CHOL, HDL, LDLCALC, TRIG, CHOLHDL, LDLDIRECT in the last 72 hours. Thyroid  Function Tests: No results for input(s): TSH, T4TOTAL, FREET4, T3FREE, THYROIDAB in the last 72 hours. Anemia Panel: No results for input(s): VITAMINB12, FOLATE, FERRITIN, TIBC, IRON , RETICCTPCT in the last 72 hours. Sepsis Labs: Recent Labs  Lab 03/19/24 1841 03/20/24 0140 03/20/24 0354 03/20/24 0646  LATICACIDVEN 2.0* 2.5* 2.0* 1.4    No results found for this or any previous visit (from the past 240 hours).       Radiology Studies: No results found.         LOS: 2 days   Time spent= 35 mins    Burgess JAYSON Dare, MD [facility]  If 7PM-7AM, please contact night-coverage  03/22/2024, 11:07 AM

## 2024-03-22 NOTE — Consult Note (Signed)
 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  The patient was seen in consultation by Dr. Lorriane on 03/19/2024.  He recommended inpatient admission to an acute psychiatric facility for further stabilization.  The patient agreed for involuntary admission and the recommendation was to IVC her if she changes her mind.  However it appears that the patient was IVC in the ED to ensure that she gets up proper treatment and does not change her mind.  The patient was initially quite upset about the fact that she was IVC.  The patient was seen by this writer to provide additional information and reassurance over the plan.  Apparently the patient has already been accepted by [facility] but before she could be transferred, she was admitted to the medical floor for stabilization of her status.  She is currently not yet medically cleared.   03/20/2024:  Mental status examination: When seen today, the patient was lying in bed getting an IV access.  She is on one-on-one observation and the charge nurse was present.   She is alert oriented and cooperative and fairly pleasant but somewhat frustrated that despite her asking for voluntary admission, she was IVC in the ED. She maintained good eye contact.  She is oriented x 4.  Although she admits to depression and taking the overdose, no active SI/HI/AVH noted currently.  There is no psychomotor retardation or agitation.  She understands that she will remain on the IVC at this time.  She is cognitively intact.  03/21/2024: The patient was seen and reevaluated today.  The chart was reviewed.  Received information that the patient is medically cleared today.  When examined she was reporting that she had significant conflicts  prior to admission when an ex-boyfriend was released from jail have apparently been threatening.  She got very frustrated and apparently texted that she is rather kill herself than someone else out.  On examination today the patient is alert oriented and cooperative.  Her affect is dysthymic her speech is coherent without any obvious looseness of association or flight of ideas or tangentiality.  Although she continues to contract for safety she reports that she had relapsed and this was the first time she had actually hurt herself after being able to prevent herself from cutting for over a year.  While in the hospital she is contracting for safety.  03/22/2024: The patient remains on IVC.  Staff reports no behavioral problems.  She is still on one-on-one observation.  Awaiting placement and information has been faxed out.  Patient does have a legal guardian.  Patient had a few episodes of agitation and anxiety and last night received as needed medications for sinus tachycardia she was started on Cardizem drip overnight.  This was weaned off this morning.  On examination patient is alert oriented cooperative but dysthymic.  Her speech is coherent without any obvious looseness of associations flight of ideas or tangentiality.  She reports that she occasionally uses Estacy , but during this episode she took it as an intentional overdose and thinks that it was laced with methamphetamine.  She claims that she does not use meth normally.  She is no longer in contact with her ex-boyfriend.  She is contracting for safety.  Recommendations:  Patient is currently on IVC and will continue to seek placement today psychiatric  facility. The patient is medically cleared and will refer to the [facility] for a psychiatric bed. The patient will continue on one-on-one observation.  Limited phone privileges at the discretion of the nursing staff. Will continue to follow until she is accepted to a psychiatric  facility.    Total Time Spent in Direct Patient Care:  I personally spent 15 minutes on the unit in direct patient care. The direct patient care time included face-to-face time with the patient, reviewing the patient's chart, communicating with other professionals, and coordinating care. Greater than 50% of this time was spent in counseling or coordinating care with the patient regarding goals of hospitalization, psycho-education, and discharge planning needs.  Madoline Bhatt,MD,MBA,DLFAPA Psychiatrist.

## 2024-03-22 NOTE — TOC Progression Note (Addendum)
 Transition of Care [facility]) - Progression Note    Patient Details  Name: Amy Wall MRN: [id number] Date of Birth: 25-Jan-2006  Transition of [facility]) CM/SW Contact  Isaiah Public, LCSWA Phone Number: 03/22/2024, 11:04 AM  Clinical Narrative:     CSW refaxed patient out for inpatient psych placement. CSW will continue to follow.  Update- CSW received call from [place] with [facility] who informed CSW that they can offer inpatient psych bed for patient. CSW Lvm for patients guardian Shelba. CSW awaiting call back.   Update- CSW updated patients guardian Shelba who is agreeable to bed offer and transfer. CSW LVM with Rosina with [facility]. CSW awaiting call back to confirm that bed is still available.  Update- CSW received call back from [place] with [facility] who informed CSW that they have bed for patient today. Rosina requested covid test and pregnancy test for patient. CSW informed MD.  Accepting MD is Dr. Gearldine. Patient will be on [facility] unit. Number for report is [phone].CSW updated patients guardian Shelba. [name] informed CSW that patient has a engineer, drilling.Patients Guardian requesting letter from MD showing patient was in the hospital. CSW informed MD. MD completed letter requested. CM sent to patients guardian.   Update- Rosina with [facility] informed CSW that if covid result and pregnancy result still pending, patient will still have a bed at [facility] tomorrow if patient does not dc today. CSW informed MD.                   Expected Discharge Plan and Services                                               Social Drivers of Health (SDOH) Interventions SDOH Screenings   Food Insecurity: No Food Insecurity (03/20/2024)  Housing: Low Risk  (03/20/2024)  Transportation Needs: Unmet Transportation Needs (03/20/2024)  Utilities: Not At Risk (03/20/2024)  Alcohol Screen: Low Risk  (11/03/2023)  Depression  (PHQ2-9): Low Risk  (10/20/2023)  Financial Resource Strain: Medium Risk (06/28/2022)  Physical Activity: Inactive (06/28/2022)  Social Connections: Socially Isolated (03/20/2024)  Stress: No Stress Concern Present (06/28/2022)  Tobacco Use: Medium Risk (03/20/2024)    Readmission Risk Interventions     No data to display

## 2024-03-23 ENCOUNTER — Inpatient Hospital Stay (HOSPITAL_COMMUNITY): Payer: MEDICAID

## 2024-03-23 DIAGNOSIS — T43642A Poisoning by ecstasy, intentional self-harm, initial encounter: Secondary | ICD-10-CM | POA: Diagnosis not present

## 2024-03-23 DIAGNOSIS — R569 Unspecified convulsions: Secondary | ICD-10-CM | POA: Diagnosis not present

## 2024-03-23 DIAGNOSIS — R000 Tachycardia, unspecified: Secondary | ICD-10-CM | POA: Diagnosis not present

## 2024-03-23 MED ORDER — PROMETHAZINE (PHENERGAN) 6.25MG IN NS 50ML IVPB
6.2500 mg | Freq: Four times a day (QID) | INTRAVENOUS | Status: DC | PRN
  Administered 2024-03-23: 6.25 mg via INTRAVENOUS
  Filled 2024-03-23: qty 6.25

## 2024-03-23 MED ORDER — PROMETHAZINE HCL 25 MG RE SUPP: 25.0000 mg | Freq: Four times a day (QID) | RECTAL | Status: DC | PRN

## 2024-03-23 MED ORDER — PROMETHAZINE HCL 25 MG PO TABS
25.0000 mg | ORAL_TABLET | Freq: Four times a day (QID) | ORAL | Status: DC | PRN
  Administered 2024-03-23 – 2024-03-25 (×4): 25 mg via ORAL
  Filled 2024-03-23 (×5): qty 1

## 2024-03-23 NOTE — Plan of Care (Signed)
   Problem: Education: Goal: Knowledge of General Education information will improve Description: Including pain rating scale, medication(s)/side effects and non-pharmacologic comfort measures Outcome: Progressing   Problem: Activity: Goal: Risk for activity intolerance will decrease Outcome: Progressing   Problem: Coping: Goal: Level of anxiety will decrease Outcome: Progressing

## 2024-03-23 NOTE — Procedures (Signed)
 Patient Name: Amy Wall  MRN: [id number]  Epilepsy Attending: Arlin MALVA Krebs  Referring Physician/Provider: Andrez Chroman, NP  Date: 03/23/2024 Duration: 23.09 mins  Patient history: 18 year old female with seizure-like activity.  EEG evaluate for seizure.  Level of alertness: Awake  AEDs during EEG study: Ativan    Technical aspects: This EEG study was done with scalp electrodes positioned according to the 10-20 International system of electrode placement. Electrical activity was reviewed with band pass filter of 1-70Hz , sensitivity of 7 uV/mm, display speed of 20mm/sec with a 60Hz  notched filter applied as appropriate. EEG data were recorded continuously and digitally stored.  Video monitoring was available and reviewed as appropriate.  Description: The posterior dominant rhythm consists of 9-10 Hz activity of moderate voltage (25-35 uV) seen predominantly in posterior head regions, symmetric and reactive to eye opening and eye closing. Physiologic photic driving was not seen during photic stimulation.  Hyperventilation was not performed.     IMPRESSION: This study is within normal limits. No seizures or epileptiform discharges were seen throughout the recording.  A normal interictal EEG does not exclude the diagnosis of epilepsy.   Khamron Gellert O Pau Banh

## 2024-03-23 NOTE — TOC Progression Note (Addendum)
 Transition of Care [facility]) - Progression Note    Patient Details  Name: Amy Wall MRN: [id number] Date of Birth: [birth year]  Transition of Care [facility]) CM/SW Contact  Bridget Cordella Simmonds, LCSW Phone Number: 03/23/2024, 2:01 PM  Clinical Narrative:   Both covid and pregnancy test negative, CSW spoke with [facility] and she asked that paper copies of these results be sent along with pt.  Copies placed in DC envelope.   IVC paperwork on chart, dated 03/20/24.  Per MD, awaiting results of EEG.                    Expected Discharge Plan and Services                                               Social Drivers of Health (SDOH) Interventions SDOH Screenings   Food Insecurity: No Food Insecurity (03/20/2024)  Housing: Low Risk  (03/20/2024)  Transportation Needs: Unmet Transportation Needs (03/20/2024)  Utilities: Not At Risk (03/20/2024)  Alcohol Screen: Low Risk  (11/03/2023)  Depression (PHQ2-9): Low Risk  (10/20/2023)  Financial Resource Strain: Medium Risk (06/28/2022)  Physical Activity: Inactive (06/28/2022)  Social Connections: Socially Isolated (03/20/2024)  Stress: No Stress Concern Present (06/28/2022)  Tobacco Use: Medium Risk (03/20/2024)    Readmission Risk Interventions     No data to display

## 2024-03-23 NOTE — Progress Notes (Addendum)
     Patient Name: Amy Wall           DOB: 2005/10/18  MRN: [id number]      Admission Date: 03/19/2024  Attending Provider: Caleen Burgess BROCKS, MD  Primary Diagnosis: Sinus tachycardia   Level of care: Med-Surg   OVERNIGHT EVENT  I was notified by RN that pt had a witnessed seizure- like activity, lasted approximately 5-10 min.  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 Resolved without medical intervention. Stable now and on RA.  She was admitted 3 days ago for intentional overdose, UDS positive for amphetamine / THC. Had a 15 min seizure- like movement en route to hospital per EMS, treated with benzos.   Plan: Sidebar conversation with neuro who recommends routine EEG in the morning    Jessy Calixte, DNP, ACNPC- AG [facility] [place]

## 2024-03-23 NOTE — Progress Notes (Signed)
 EEG complete - results pending

## 2024-03-23 NOTE — Progress Notes (Addendum)
 2203 - Alerted by sitter that patient had one episode of vomiting and was now on the bathroom floor having a seizure. This RN responded to room; patient seen lying on bathroom floor exhibiting jerky upper body movements and frothing at the mouth. Patient placed in side lying position for airway protection. Charge RN present at bedside.   RRT activated. NP on duty notified. Vital signs obtained. RRT arrived to bedside, no immediate interventions initiated. RRT verbalized that episode is consistent with patient's previous psuedoseizure activity.   2213 - Telephone call received from NP with new orders. For EEG at 0800 03/23/24. PRN Ativan  administered as ordered. IV Zofran  given for nausea/vomiting. Patient resting in bed, alert and oriented but drowsy. Verbalized she is feeling tired. Sitter continued at bedside for safety.

## 2024-03-23 NOTE — Progress Notes (Signed)
 PROGRESS NOTE    Amy Wall  FMW:[id number] DOB: 11/03/2005 DOA: 03/19/2024 PCP: Pcp, No  Chief Complaint  Patient presents with   Aggressive Behavior    Brief Narrative:   18 y.o. female with medical history significant of MDD, PTSD, HTN, and GERD p/w suicide attempt c/b sinus tachycardia after methamphetamine ingestion.  Upon admission noted to have sinus tachycardia, UDS positive for amphetamine /THC.  MRI negative for stroke.  Psychiatry team was consulted.  Initially patient had to be started on Cardizem drip but overnight her heart rate improved and Cardizem drip was weaned off.    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  UDS was also positive for cocaine.  Poison control was notified.  MRI brain and CT head is negative, alcohol level negative, UA is negative, CK levels are normal.  Cardizem drip has been turned off and medically she is feeling much better.  Heart rate remains stable in 70s along with blood pressure.   Hypokalemia - As needed repletion   MDD/PTSD Concerns of suicide attempt -Seen by psychiatry.  Currently on Zoloft  and Abilify .  Patient will require inpatient psych admission   GERD Continue PPI twice daily     DVT prophylaxis: reportedly refusing lovenox per previous documentation - will order SCD Code Status: full Family Communication: none Disposition:   Status is: Inpatient Remains inpatient appropriate  because: need for inpatient care   Consultants:  Neurology   Procedures:  EEG MPRESSION: This study is within normal limits. No seizures or epileptiform discharges were seen throughout the recording.   Jettie Mannor normal interictal EEG does not exclude the diagnosis of epilepsy.  Antimicrobials:  Anti-infectives (From admission, onward)    None       Subjective: Doesn't remember anything about Stacey Sago seizure  Objective: Vitals:   03/22/24 1927 03/22/24 2212 03/23/24 0206 03/23/24 0835  BP: (!) 119/92 128/77 (!) 110/57 (!) 93/55  Pulse: 79 79 75 67  Resp: 17 16 16 17   Temp: 99.5 F (37.5 C)  98.5 F (36.9 C) 98.6 F (37 C)  TempSrc:   Axillary   SpO2:  100% 100%   Weight:      Height:       No intake or output data in the 24 hours ending 03/23/24 1858 Filed Weights   03/19/24 1446  Weight: 54.4 kg    Examination:  General exam: Appears calm and comfortable - sitting up in Sanjuan Sawa chair Respiratory system: unlabored Cardiovascular system: RRR Central nervous system: Alert and oriented. No focal neurological deficits. Extremities: no LEE   Data Reviewed: I have personally reviewed following labs and imaging studies  CBC: Recent Labs  Lab 03/19/24 1558  WBC 8.3  NEUTROABS 6.2  HGB 12.7  HCT 36.9  MCV 87.9  PLT 321    Basic Metabolic Panel: Recent Labs  Lab 03/19/24 1558 03/21/24 0431  NA 137 140  K 3.5 3.3*  CL 102 109  CO2 21* 18*  GLUCOSE 106* 77  BUN 11 11  CREATININE 0.57  0.58  CALCIUM  9.3 8.5*    GFR: Estimated Creatinine Clearance: 89.5 mL/min (by C-G formula based on SCr of 0.58 mg/dL).  Liver Function Tests: Recent Labs  Lab 03/19/24 1558  AST 23  ALT 12  ALKPHOS 74  BILITOT 0.8  PROT 7.9  ALBUMIN 4.2    CBG: Recent Labs  Lab 03/19/24 2044 03/22/24 1244  GLUCAP 86 79     Recent Results (from the past 240 hours)  SARS Coronavirus 2 by RT PCR (hospital order, performed in [facility] hospital lab) *cepheid single result test*  Anterior Nasal Swab     Status: None   Collection Time: 03/22/24  2:45 PM   Specimen: Anterior Nasal Swab  Result Value Ref Range Status   SARS Coronavirus 2 by RT PCR NEGATIVE NEGATIVE Final    Comment: Performed at [facility], [address] [place], [place] [address]         Radiology Studies: EEG adult Result Date: 03/23/2024 Shelton Arlin KIDD, MD     03/23/2024  4:28 PM Patient Name: CYSTAL SHANNAHAN MRN: [id number] Epilepsy Attending: Arlin KIDD Shelton Referring Physician/Provider: Andrez Chroman, NP Date: 03/23/2024 Duration: 23.09 mins Patient history: 18 year old female with seizure-like activity.  EEG evaluate for seizure. Level of alertness: Awake AEDs during EEG study: Ativan  Technical aspects: This EEG study was done with scalp electrodes positioned according to the 10-20 International system of electrode placement. Electrical activity was reviewed with band pass filter of 1-70Hz , sensitivity of 7 uV/mm, display speed of 71mm/sec with Chanceler Pullin 60Hz  notched filter applied as appropriate. EEG data were recorded continuously and digitally stored.  Video monitoring was available and reviewed as appropriate. Description: The posterior dominant rhythm consists of 9-10 Hz activity of moderate voltage (25-35 uV) seen predominantly in posterior head regions, symmetric and reactive to eye opening and eye closing. Physiologic photic driving was not seen during photic stimulation.  Hyperventilation was not performed.   IMPRESSION: This study is within normal limits. No seizures or epileptiform discharges were seen throughout the recording. Kraven Calk normal interictal EEG does not exclude the diagnosis of epilepsy. Priyanka O Yadav        Scheduled Meds:  alum & mag hydroxide-simeth  30 mL Oral Once   And   lidocaine   15 mL Oral Once   ARIPiprazole   15 mg Oral Daily   feeding supplement  237 mL Oral BID BM   fluticasone  furoate-vilanterol  1 puff Inhalation Daily   pantoprazole   40 mg Oral  BID AC   potassium chloride   40 mEq Oral Once   sertraline   100 mg Oral Daily   Continuous Infusions:  sodium chloride  Stopped (03/22/24 0959)   diltiazem (CARDIZEM) infusion Stopped (03/21/24 1008)   promethazine  (PHENERGAN ) injection (IM or IVPB)       LOS: 3 days    Time spent: over 30 min    Meliton Monte, MD [facility]   To contact the attending provider between 7A-7P or the covering provider during after hours 7P-7A, please log into the web site [email or web address] and access using universal [place] password for that web site. If you do not have the password, please call the hospital operator.  03/23/2024, 6:58 PM

## 2024-03-23 NOTE — Consult Note (Signed)
 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  The patient was seen in consultation by Dr. Lorriane on 03/19/2024.  He recommended inpatient admission to an acute psychiatric facility for further stabilization.  The patient agreed for involuntary admission and the recommendation was to IVC her if she changes her mind.  However it appears that the patient was IVC in the ED to ensure that she gets up proper treatment and does not change her mind.  The patient was initially quite upset about the fact that she was IVC.  The patient was seen by this writer to provide additional information and reassurance over the plan.  Apparently the patient has already been accepted by [facility] but before she could be transferred, she was admitted to the medical floor for stabilization of her status.  She is currently not yet medically cleared.   03/20/2024:  Mental status examination: When seen today, the patient was lying in bed getting an IV access.  She is on one-on-one observation and the charge nurse was present.   She is alert oriented and cooperative and fairly pleasant but somewhat frustrated that despite her asking for voluntary admission, she was IVC in the ED. She maintained good eye contact.  She is oriented x 4.  Although she admits to depression and taking the overdose, no active SI/HI/AVH noted currently.  There is no psychomotor retardation or agitation.  She understands that she will remain on the IVC at this time.  She is cognitively intact.  03/21/2024: The patient was seen and reevaluated today.  The chart was reviewed.  Received information that the patient is medically cleared today.  When examined she was reporting that she had significant conflicts  prior to admission when an ex-boyfriend was released from jail have apparently been threatening.  She got very frustrated and apparently texted that she is rather kill herself than someone else out.  On examination today the patient is alert oriented and cooperative.  Her affect is dysthymic her speech is coherent without any obvious looseness of association or flight of ideas or tangentiality.  Although she continues to contract for safety she reports that she had relapsed and this was the first time she had actually hurt herself after being able to prevent herself from cutting for over a year.  While in the hospital she is contracting for safety.  03/22/2024: The patient remains on IVC.  Staff reports no behavioral problems.  She is still on one-on-one observation.  Awaiting placement and information has been faxed out.  Patient does have a legal guardian.  Patient had a few episodes of agitation and anxiety and last night received as needed medications for sinus tachycardia she was started on Cardizem drip overnight.  This was weaned off this morning.  On examination patient is alert oriented cooperative but dysthymic.  Her speech is coherent without any obvious looseness of associations flight of ideas or tangentiality.  She reports that she occasionally uses Estacy , but during this episode she took it as an intentional overdose and thinks that it was laced with methamphetamine.  She claims that she does not use meth normally.  She is no longer in contact with her ex-boyfriend.  She is contracting for safety.  03/23/2024: The patient was seen and evaluated today.  The chart was reviewed.  She  is scheduled for discharge to [facility] yesterday but unfortunately it did not happen logistically.  She is still on the IVC.However records indicate that patient still has some vomiting and having some seizures earlier this morning.  Patient is scheduled for EEG at 8 AM Today.  Patient still has a  comptroller. On examination today the patient was alert somewhat withdrawn with a blunted affect and with minimal interaction.  She maintained poor eye contact.  Will continue to monitor her from a psychiatric perspective until stabilized medically.  Recommendations:  Patient is currently on IVC and will continue to seek placement today psychiatric facility. Patient has new onset seizures and is being monitored currently. The patient will continue on one-on-one observation.  Will continue to follow until she is accepted to a psychiatric facility.    Total Time Spent in Direct Patient Care:  I personally spent 15 minutes on the unit in direct patient care. The direct patient care time included face-to-face time with the patient, reviewing the patient's chart, communicating with other professionals, and coordinating care. Greater than 50% of this time was spent in counseling or coordinating care with the patient regarding goals of hospitalization, psycho-education, and discharge planning needs.  Deshawn Witty,MD,MBA,DLFAPA Psychiatrist.

## 2024-03-24 ENCOUNTER — Inpatient Hospital Stay (HOSPITAL_COMMUNITY): Payer: MEDICAID

## 2024-03-24 DIAGNOSIS — F4310 Post-traumatic stress disorder, unspecified: Secondary | ICD-10-CM | POA: Diagnosis not present

## 2024-03-24 DIAGNOSIS — F1220 Cannabis dependence, uncomplicated: Secondary | ICD-10-CM | POA: Diagnosis not present

## 2024-03-24 DIAGNOSIS — R000 Tachycardia, unspecified: Secondary | ICD-10-CM | POA: Diagnosis not present

## 2024-03-24 DIAGNOSIS — F1994 Other psychoactive substance use, unspecified with psychoactive substance-induced mood disorder: Secondary | ICD-10-CM | POA: Diagnosis not present

## 2024-03-24 DIAGNOSIS — F1520 Other stimulant dependence, uncomplicated: Secondary | ICD-10-CM | POA: Diagnosis not present

## 2024-03-24 LAB — COMPREHENSIVE METABOLIC PANEL WITH GFR
ALT: 13 U/L (ref 0–44)
AST: 13 U/L — ABNORMAL LOW (ref 15–41)
Albumin: 3.3 g/dL — ABNORMAL LOW (ref 3.5–5.0)
Alkaline Phosphatase: 64 U/L (ref 38–126)
Anion gap: 11 (ref 5–15)
BUN: 10 mg/dL (ref 6–20)
CO2: 25 mmol/L (ref 22–32)
Calcium: 8.9 mg/dL (ref 8.9–10.3)
Chloride: 102 mmol/L (ref 98–111)
Creatinine, Ser: 0.64 mg/dL (ref 0.44–1.00)
GFR, Estimated: >60 mL/min (ref >60)
Glucose, Bld: 86 mg/dL (ref 70–99)
Potassium: 2.9 mmol/L — ABNORMAL LOW (ref 3.5–5.1)
Sodium: 138 mmol/L (ref 135–145)
Total Bilirubin: 0.4 mg/dL (ref 0.0–1.2)
Total Protein: 6.3 g/dL — ABNORMAL LOW (ref 6.5–8.1)

## 2024-03-24 LAB — GLUCOSE, CAPILLARY: Glucose-Capillary: 98 mg/dL (ref 70–99)

## 2024-03-24 LAB — CBC WITH DIFFERENTIAL/PLATELET
Abs Immature Granulocytes: 0.01 K/uL (ref 0.00–0.07)
Basophils Absolute: 0 K/uL (ref 0.0–0.1)
Basophils Relative: 0 %
Eosinophils Absolute: 0 K/uL (ref 0.0–0.5)
Eosinophils Relative: 1 %
HCT: 30.9 % — ABNORMAL LOW (ref 36.0–46.0)
Hemoglobin: 11 g/dL — ABNORMAL LOW (ref 12.0–15.0)
Immature Granulocytes: 0 %
Lymphocytes Relative: 41 %
Lymphs Abs: 2.4 K/uL (ref 0.7–4.0)
MCH: 30.6 pg (ref 26.0–34.0)
MCHC: 35.6 g/dL (ref 30.0–36.0)
MCV: 86.1 fL (ref 80.0–100.0)
Monocytes Absolute: 0.4 K/uL (ref 0.1–1.0)
Monocytes Relative: 7 %
Neutro Abs: 3.1 K/uL (ref 1.7–7.7)
Neutrophils Relative %: 51 %
Platelets: 295 K/uL (ref 150–400)
RBC: 3.59 MIL/uL — ABNORMAL LOW (ref 3.87–5.11)
RDW: 11.9 % (ref 11.5–15.5)
WBC: 6 K/uL (ref 4.0–10.5)
nRBC: 0 % (ref 0.0–0.2)

## 2024-03-24 LAB — TROPONIN I (HIGH SENSITIVITY): Troponin I (High Sensitivity): 3 ng/L (ref <18)

## 2024-03-24 LAB — LIPASE, BLOOD: Lipase: 29 U/L (ref 11–51)

## 2024-03-24 MED ORDER — POTASSIUM CHLORIDE CRYS ER 10 MEQ PO TBCR
10.0000 meq | EXTENDED_RELEASE_TABLET | Freq: Four times a day (QID) | ORAL | Status: AC
  Administered 2024-03-24: 10 meq via ORAL
  Filled 2024-03-24: qty 1

## 2024-03-24 MED ORDER — DICLOFENAC SODIUM 1 % EX GEL
2.0000 g | Freq: Four times a day (QID) | CUTANEOUS | Status: DC
  Administered 2024-03-24 – 2024-03-26 (×7): 2 g via TOPICAL
  Filled 2024-03-24: qty 100

## 2024-03-24 MED ORDER — POTASSIUM CHLORIDE CRYS ER 20 MEQ PO TBCR
40.0000 meq | EXTENDED_RELEASE_TABLET | ORAL | Status: DC
  Filled 2024-03-24: qty 2

## 2024-03-24 MED ORDER — LORAZEPAM 2 MG/ML IJ SOLN
1.0000 mg | Freq: Once | INTRAMUSCULAR | Status: AC
  Administered 2024-03-24: 1 mg via INTRAVENOUS
  Filled 2024-03-24: qty 1

## 2024-03-24 MED ORDER — KETOROLAC TROMETHAMINE 15 MG/ML IJ SOLN
30.0000 mg | Freq: Once | INTRAMUSCULAR | Status: AC
  Administered 2024-03-24: 30 mg via INTRAVENOUS
  Filled 2024-03-24: qty 2

## 2024-03-24 MED ORDER — POTASSIUM CHLORIDE 2 MEQ/ML IV SOLN
INTRAVENOUS | Status: AC
  Filled 2024-03-24: qty 1000

## 2024-03-24 NOTE — Significant Event (Addendum)
 Rapid Response Event Note   Reason for Call :  Asked to see pt as a 2nd set of eyes for N/V/ABD pain/CP.  Per RN, pt was in shower and started feeling weak/dizzy/SOB and began to have CP. Pt was assisted back to bed and MD notified.  EKG and trop ordered  Initial Focused Assessment:  Pt rocking side to side in bed, crying and screaming. Her nose is running and she is dry heaving. She calms down a little when you tell her you need her to answer some questions to figure out how to help her. She is c/o N/V and  10/10 epigastric pain that is radiating down her ABD. Her lungs are clear. ABD tender/soft/non-distended. She skin is hot to touch/dry.   HR-80, SBP-120s, RR-26, SpO2-100% on RA  Interventions:  EKG done PTA RRT-NSR with sinus arrhythmia, nonspecific T wave abnormality Trop 1mg  ativan  IV x 1 Plan of Care:  Pt calmer after interventions. Await trop results. Continue to monitor pt closely. Please call RRT if further assistance needed.   Event Summary:   MD Notified: Andrez [facility] NP  Call Time:0300 Arrival Time:0330 End Time:0410  Tish Graeme Piety, RN

## 2024-03-24 NOTE — TOC Progression Note (Signed)
 Transition of Care [facility]) - Progression Note    Patient Details  Name: Amy Wall MRN: [id number] Date of Birth: 08/12/2005  [facility] of [facility]) CM/SW Contact  Isaiah Public, LCSWA Phone Number: 03/24/2024, 5:26 PM  Clinical Narrative:      CSW continues to follow for medical readiness.                   Expected Discharge Plan and Services                                               Social Drivers of Health (SDOH) Interventions SDOH Screenings   Food Insecurity: No Food Insecurity (03/20/2024)  Housing: Low Risk  (03/20/2024)  Transportation Needs: Unmet Transportation Needs (03/20/2024)  Utilities: Not At Risk (03/20/2024)  Alcohol Screen: Low Risk  (11/03/2023)  Depression (PHQ2-9): Low Risk  (10/20/2023)  Financial Resource Strain: Medium Risk (06/28/2022)  Physical Activity: Inactive (06/28/2022)  Social Connections: Socially Isolated (03/20/2024)  Stress: No Stress Concern Present (06/28/2022)  Tobacco Use: Medium Risk (03/20/2024)    Readmission Risk Interventions     No data to display

## 2024-03-24 NOTE — Consult Note (Addendum)
 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  The patient was seen in consultation by Dr. Lorriane on 03/19/2024.  He recommended inpatient admission to an acute psychiatric facility for further stabilization.  The patient agreed for involuntary admission and the recommendation was to IVC her if she changes her mind.  However it appears that the patient was IVC in the ED to ensure that she gets up proper treatment and does not change her mind.  The patient was initially quite upset about the fact that she was IVC.  The patient was seen by this writer to provide additional information and reassurance over the plan.  Apparently the patient has already been accepted by [facility] but before she could be transferred, she was admitted to the medical floor for stabilization of her status.  She is currently not yet medically cleared.   03/20/2024:  Mental status examination: When seen today, the patient was lying in bed getting an IV access.  She is on one-on-one observation and the charge nurse was present.   She is alert oriented and cooperative and fairly pleasant but somewhat frustrated that despite her asking for voluntary admission, she was IVC in the ED. She maintained good eye contact.  She is oriented x 4.  Although she admits to depression and taking the overdose, no active SI/HI/AVH noted currently.  There is no psychomotor retardation or agitation.  She understands that she will remain on the IVC at this time.  She is cognitively intact.  03/21/2024: The patient was seen and reevaluated today.  The chart was reviewed.  Received  information that the patient is medically cleared today.  When examined she was reporting that she had significant conflicts prior to admission when an ex-boyfriend was released from jail have apparently been threatening.  She got very frustrated and apparently texted that she is rather kill herself than someone else out.  On examination today the patient is alert oriented and cooperative.  Her affect is dysthymic her speech is coherent without any obvious looseness of association or flight of ideas or tangentiality.  Although she continues to contract for safety she reports that she had relapsed and this was the first time she had actually hurt herself after being able to prevent herself from cutting for over a year.  While in the hospital she is contracting for safety.  03/22/2024: The patient remains on IVC.  Staff reports no behavioral problems.  She is still on one-on-one observation.  Awaiting placement and information has been faxed out.  Patient does have a legal guardian.  Patient had a few episodes of agitation and anxiety and last night received as needed medications for sinus tachycardia she was started on Cardizem drip overnight.  This was weaned off this morning.  On examination patient is alert oriented cooperative but dysthymic.  Her speech is coherent without any obvious looseness of associations flight of ideas or tangentiality.  She reports that she occasionally uses Estacy , but during this episode she took it as an intentional overdose and thinks that it was laced with methamphetamine.  She claims that she does not use meth normally.  She is no longer in contact with her ex-boyfriend.  She is contracting for safety.  03/23/2024:  The patient was seen and evaluated today.  The chart was reviewed.  She is scheduled for discharge to [facility] yesterday but unfortunately it did not happen logistically.  She is still on the IVC.However records indicate that patient still has some  vomiting and having some seizures earlier this morning.  Patient is scheduled for EEG at 8 AM Today.  Patient still has a comptroller. On examination today the patient was alert somewhat withdrawn with a blunted affect and with minimal interaction.  She maintained poor eye contact.  Will continue to monitor her from a psychiatric perspective until stabilized medically.  03/24/2024 (Time spent: 30 minutes) Patient seen face to face in her hospital room with 1:1 sitter at bedside. Patient is awake, alert and oriented x 4. Patient reports she had a seizure episode yesterday morning, she reports being confused after the event and could not remember details of what happened. Patient denies any seizure episodes in the past 24 hours. On further questioning, patient reports previous seizure-like episode about a year ago during which she was started on Depakote  and became seizure free thereafter. However, patient states she was asked to stop the medication because the doctor did not believe she had true seizure.   Patient does not know her current psychotropic medications but states she needs medications for depression, anxiety and sleep. She reports following up with an ACT team but does not remember the name. Patient would like to be discharged to an inpatient mental health center at [facility]) once she's medically cleared.    On examination, she was calm but somewhat guarded with poor eye contact. She appeared depressed with constricted affect.However, no perceptual disturbances or SI/HI.    Plan/Recommendations:  Continue Abilify  15 mg daily for mood and Trazodone  25 mg at bedtime for sleep.  Patient is currently on IVC and will continue to seek placement to psychiatric facility. Patient had new onset seizures and is being monitored currently. The patient will continue on one-on-one observation.  Will continue to follow until she is accepted to a psychiatric facility. Was advised to stay away  from drugs which could further precipitate seizure and worsen her mental health symptoms.     Jan Donath, MD Psychiatrist.

## 2024-03-24 NOTE — Plan of Care (Signed)

## 2024-03-24 NOTE — Progress Notes (Addendum)
 PROGRESS NOTE    Amy Wall  FMW:[id number] DOB: 2005-10-16 DOA: 03/19/2024 PCP: Pcp, No  Chief Complaint  Patient presents with   Aggressive Behavior    Brief Narrative:   18 y.o. female with medical history significant of MDD, PTSD, HTN, and GERD p/w suicide attempt c/b sinus tachycardia after methamphetamine ingestion.  Upon admission noted to have sinus tachycardia, UDS positive for amphetamine /THC.  MRI negative for stroke.  Psychiatry team was consulted.  Initially patient had to be started on Cardizem drip but overnight her heart rate improved and Cardizem drip was weaned off.    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 No aed at this time.   Per [place] statutes, patients with seizures are not allowed to drive until  they have been seizure-free for six months. Use caution when using heavy equipment or power tools. Avoid working on ladders or at heights. Take showers instead of baths. Ensure the water  temperature is not too high on the home water  heater. Do not go swimming alone. When caring for infants or small children, sit down when holding, feeding, or changing them to minimize risk of injury to the child in the event you have Aqib Lough seizure. Also, Maintain good sleep hygiene. Avoid alcohol.  Nausea  Vomiting  Abdominal Pain Symptomatic management Lipase wnl, LFT's wnl Will get CT abd/pelvis Symptoms improved at this time  Chest Pain Reproducible with palpation Troponins negative, EKG reassuring Trial voltaren  to chest discomfort  Sinus tachycardia secondary to polysubstance abuse Hypokalemia Lactic acidosis, resolved Patient  ingested methamphetamine.  UDS was also positive for cocaine.  Poison control was notified.  MRI brain and CT head is negative, alcohol level negative, UA is negative, CK levels are normal.  Cardizem drip has been turned off and medically she is feeling much better.  Heart rate remains stable in 70s along with blood pressure.   Hypokalemia - As needed repletion   MDD/PTSD Concerns of suicide attempt -Seen by psychiatry.  Currently on Zoloft  and Abilify .  Patient will require inpatient psych admission   GERD Continue PPI twice daily     DVT prophylaxis: reportedly refusing lovenox per previous documentation - will order SCD Code Status: full Family Communication: none Disposition:   Status is: Inpatient Remains inpatient appropriate because: need for inpatient care   Consultants:  Neurology   Procedures:  EEG MPRESSION: This study is within normal limits. No seizures or epileptiform discharges were seen throughout the recording.   Ashten Sarnowski normal interictal EEG does not exclude the diagnosis of epilepsy.  Antimicrobials:  Anti-infectives (From admission, onward)    None       Subjective: Took shower last night, got dizzy - then had CP and abdominal pain/N/V  Objective: Vitals:   03/23/24 2004 03/23/24 2300 03/24/24 0005 03/24/24 0544  BP: 104/66 111/72 111/72 (!) 100/56  Pulse: 62 63 63 67  Resp: 17 18 17 18   Temp: 99.3 F (37.4 C) 98.4 F (36.9 C) 98.7 F (37.1 C) 98.5 F (36.9 C)  TempSrc: Oral Oral Axillary Axillary  SpO2: 100% 100% 100% 99%  Weight:      Height:       No intake or output data in the  24 hours ending 03/24/24 0921 Filed Weights   03/19/24 1446  Weight: 54.4 kg    Examination:  General: No acute distress. Cardiovascular: tachycardic, regular - reproducible CP with palpation Lungs: Clear to auscultation bilaterally Abdomen: Soft, nontender, nondistende Neurological: Alert and oriented 3. Moves all extremities 4 with equal strength.  Cranial nerves II through XII grossly intact. Extremities: No clubbing or cyanosis. No edema.    Data Reviewed: I have personally reviewed following labs and imaging studies  CBC: Recent Labs  Lab 03/19/24 1558 03/24/24 0128  WBC 8.3 6.0  NEUTROABS 6.2 3.1  HGB 12.7 11.0*  HCT 36.9 30.9*  MCV 87.9 86.1  PLT 321 295    Basic Metabolic Panel: Recent Labs  Lab 03/19/24 1558 03/21/24 0431 03/24/24 0128  NA 137 140 138  K 3.5 3.3* 2.9*  CL 102 109 102  CO2 21* 18* 25  GLUCOSE 106* 77 86  BUN 11 11 10   CREATININE 0.57 0.58 0.64  CALCIUM  9.3 8.5* 8.9    GFR: Estimated Creatinine Clearance: 89.5 mL/min (by C-G formula based on SCr of 0.64 mg/dL).  Liver Function Tests: Recent Labs  Lab 03/19/24 1558 03/24/24 0128  AST 23 13*  ALT 12 13  ALKPHOS 74 64  BILITOT 0.8 0.4  PROT 7.9 6.3*  ALBUMIN 4.2 3.3*    CBG: Recent Labs  Lab 03/19/24 2044 03/22/24 1244 03/24/24 0246  GLUCAP 86 79 98     Recent Results (from the past 240 hours)  SARS Coronavirus 2 by RT PCR (hospital order, performed in [facility] hospital lab) *cepheid single result test* Anterior Nasal Swab     Status: None   Collection Time: 03/22/24  2:45 PM   Specimen: Anterior Nasal Swab  Result Value Ref Range Status   SARS Coronavirus 2 by RT PCR NEGATIVE NEGATIVE Final    Comment: Performed at [facility], [address], [place], [place] [address]         Radiology Studies: EEG adult Result Date: 03/23/2024 Shelton Arlin KIDD, MD     03/23/2024  4:28 PM Patient Name: TALLEY CASCO MRN: [id number] Epilepsy Attending: Arlin KIDD Shelton Referring Physician/Provider: Andrez Chroman, NP Date: 03/23/2024 Duration: 23.09 mins Patient history: 18 year old female with seizure-like activity.  EEG evaluate for seizure. Level of alertness: Awake AEDs during EEG study: Ativan  Technical aspects: This EEG study was done with scalp electrodes positioned according to the 10-20 International  system of electrode placement. Electrical activity was reviewed with band pass filter of 1-70Hz , sensitivity of 7 uV/mm, display speed of 33mm/sec with Janara Klett 60Hz  notched filter applied as appropriate. EEG data were recorded continuously and digitally stored.  Video monitoring was available and reviewed as appropriate. Description: The posterior dominant rhythm consists of 9-10 Hz activity of moderate voltage (25-35 uV) seen predominantly in posterior head regions, symmetric and reactive to eye opening and eye closing. Physiologic photic driving was not seen during photic stimulation.  Hyperventilation was not performed.   IMPRESSION: This study is within normal limits. No seizures or epileptiform discharges were seen throughout the recording. Yvonne Stopher normal interictal EEG does not exclude the diagnosis of epilepsy. Priyanka O Yadav        Scheduled Meds:  alum & mag hydroxide-simeth  30 mL Oral Once   And   lidocaine   15 mL Oral Once   ARIPiprazole   15 mg Oral Daily   feeding supplement  237 mL Oral BID BM   fluticasone  furoate-vilanterol  1 puff Inhalation Daily  pantoprazole   40 mg Oral BID AC   sertraline   100 mg Oral Daily   Continuous Infusions:  diltiazem (CARDIZEM) infusion Stopped (03/21/24 1008)   lactated ringers  1,000 mL with potassium chloride  40 mEq infusion     promethazine  (PHENERGAN ) injection (IM or IVPB) 6.25 mg (03/23/24 2334)     LOS: 4 days    Time spent: over 30 min    Meliton Monte, MD [facility]   To contact the attending provider between 7A-7P or the covering provider during after hours 7P-7A, please log into the web site [email or web address] and access using universal [place] password for that web site. If you do not have the password, please call the hospital operator.  03/24/2024, 9:21 AM

## 2024-03-25 ENCOUNTER — Inpatient Hospital Stay (HOSPITAL_COMMUNITY): Payer: MEDICAID

## 2024-03-25 DIAGNOSIS — F1994 Other psychoactive substance use, unspecified with psychoactive substance-induced mood disorder: Secondary | ICD-10-CM

## 2024-03-25 DIAGNOSIS — F1220 Cannabis dependence, uncomplicated: Secondary | ICD-10-CM | POA: Diagnosis not present

## 2024-03-25 DIAGNOSIS — F4310 Post-traumatic stress disorder, unspecified: Secondary | ICD-10-CM | POA: Diagnosis not present

## 2024-03-25 DIAGNOSIS — R000 Tachycardia, unspecified: Secondary | ICD-10-CM | POA: Diagnosis not present

## 2024-03-25 DIAGNOSIS — F1520 Other stimulant dependence, uncomplicated: Secondary | ICD-10-CM

## 2024-03-25 LAB — BASIC METABOLIC PANEL WITH GFR
Anion gap: 10 (ref 5–15)
BUN: 12 mg/dL (ref 6–20)
CO2: 24 mmol/L (ref 22–32)
Calcium: 9.4 mg/dL (ref 8.9–10.3)
Chloride: 107 mmol/L (ref 98–111)
Creatinine, Ser: 0.62 mg/dL (ref 0.44–1.00)
GFR, Estimated: >60 mL/min (ref >60)
Glucose, Bld: 79 mg/dL (ref 70–99)
Potassium: 3.4 mmol/L — ABNORMAL LOW (ref 3.5–5.1)
Sodium: 141 mmol/L (ref 135–145)

## 2024-03-25 MED ORDER — IOHEXOL 350 MG/ML SOLN
75.0000 mL | Freq: Once | INTRAVENOUS | Status: AC | PRN
  Administered 2024-03-25: 75 mL via INTRAVENOUS

## 2024-03-25 NOTE — Progress Notes (Signed)
 PROGRESS NOTE    Amy Wall  FMW:[id number] DOB: 26-Feb-2006 DOA: 03/19/2024 PCP: Pcp, No  Chief Complaint  Patient presents with   Aggressive Behavior    Brief Narrative:   18 y.o. female with medical history significant of MDD, PTSD, HTN, and GERD p/w suicide attempt c/b sinus tachycardia after methamphetamine ingestion.  Upon admission noted to have sinus tachycardia, UDS positive for amphetamine /THC.  MRI negative for stroke.  Psychiatry team was consulted.  Initially patient had to be started on Cardizem drip but overnight her heart rate improved and Cardizem drip was weaned off.    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 No aed at this time.   Per [place]  [place] statutes, patients with seizures are not allowed to drive until  they have been seizure-free for six months. Use caution when using heavy equipment or power tools. Avoid working on ladders or at heights. Take showers instead of baths. Ensure the water  temperature is not too high on the home water  heater. Do not go swimming alone. When caring for infants or small children, sit down when holding, feeding, or changing them to minimize risk of injury to the child in the event you have [place] seizure. Also, Maintain good sleep hygiene. Avoid alcohol.  Nausea  Vomiting  Abdominal Pain Symptomatic management Lipase wnl, LFT's wnl CT abd pelvis unremarkable Symptoms improved at this time  Chest Pain Reproducible with palpation Troponins negative, EKG reassuring Trial voltaren  to chest discomfort  Sinus tachycardia secondary to polysubstance abuse Hypokalemia Lactic acidosis,  resolved Patient ingested methamphetamine.  UDS was also positive for cocaine.  Poison control was notified.  MRI brain and CT head is negative, alcohol level negative, UA is negative, CK levels are normal.  Cardizem drip has been turned off and medically she is feeling much better.  Medically cleared from this standpoint.   Hypokalemia - As needed repletion   MDD/PTSD Concerns of suicide attempt -Seen by psychiatry.  Currently on Zoloft  and Abilify .  Patient will require inpatient psych admission   GERD Continue PPI twice daily     DVT prophylaxis: reportedly refusing lovenox per previous documentation - will order SCD Code Status: full Family Communication: none Disposition:   Status is: Inpatient Remains inpatient appropriate because: need for inpatient care   Consultants:  Neurology   Procedures:  EEG MPRESSION: This study is within normal limits. No seizures or epileptiform discharges were seen throughout the recording.   [place] normal interictal EEG does not exclude the diagnosis of epilepsy.  Antimicrobials:  Anti-infectives (From admission, onward)    None       Subjective: No complaints  Objective: Vitals:   03/24/24 1701 03/24/24 2046 03/25/24 0740 03/25/24 1525  BP: (!) 106/90 (!) 135/90 105/63 (!) 111/97  Pulse: 76 80 60 77  Resp: 20 18 16 16   Temp: 99.1 F (37.3 C)  98.7 F (37.1 C) 98.8 F (37.1 C)  TempSrc: Oral     SpO2: 100% 100% 100% 100%  Weight:      Height:        Intake/Output Summary (Last 24 hours) at 03/25/2024 1902 Last data filed at 03/25/2024 1300 Gross per 24 hour  Intake 240 ml  Output --  Net 240 ml   Filed Weights   03/19/24 1446  Weight: 54.4 kg    Examination:  General: No acute distress. Lungs:unlabored Neurological: Alert and oriented 3. Moves all extremities 4 with equal strength. Cranial nerves II through XII grossly intact. Extremities: No clubbing or cyanosis. No edema.  Data Reviewed: I have  personally reviewed following labs and imaging studies  CBC: Recent Labs  Lab 03/19/24 1558 03/24/24 0128  WBC 8.3 6.0  NEUTROABS 6.2 3.1  HGB 12.7 11.0*  HCT 36.9 30.9*  MCV 87.9 86.1  PLT 321 295    Basic Metabolic Panel: Recent Labs  Lab 03/19/24 1558 03/21/24 0431 03/24/24 0128 03/25/24 1108  NA 137 140 138 141  K 3.5 3.3* 2.9* 3.4*  CL 102 109 102 107  CO2 21* 18* 25 24  GLUCOSE 106* 77 86 79  BUN 11 11 10 12   CREATININE 0.57 0.58 0.64 0.62  CALCIUM  9.3 8.5* 8.9 9.4    GFR: Estimated Creatinine Clearance: 89.5 mL/min (by C-G formula based on SCr of 0.62 mg/dL).  Liver Function Tests: Recent Labs  Lab 03/19/24 1558 03/24/24 0128  AST 23 13*  ALT 12 13  ALKPHOS 74 64  BILITOT 0.8 0.4  PROT 7.9 6.3*  ALBUMIN 4.2 3.3*    CBG: Recent Labs  Lab 03/19/24 2044 03/22/24 1244 03/24/24 0246  GLUCAP 86 79 98     Recent Results (from the past 240 hours)  SARS Coronavirus 2 by RT PCR (hospital order, performed in [facility] hospital lab) *cepheid single result test* Anterior Nasal Swab     Status: None   Collection Time: 03/22/24  2:45 PM   Specimen: Anterior Nasal Swab  Result Value Ref Range Status   SARS Coronavirus 2 by RT PCR NEGATIVE NEGATIVE Final    Comment: Performed at [facility], [address] [place], [place] [address]         Radiology Studies: CT ABDOMEN PELVIS W CONTRAST Result Date: 03/25/2024 EXAM: CT ABDOMEN AND PELVIS WITH CONTRAST 03/25/2024 01:09:58 AM TECHNIQUE: CT of the abdomen and pelvis was performed with the administration of intravenous contrast. Multiplanar reformatted images are provided for review. Automated exposure control, iterative reconstruction, and/or weight-based adjustment of the mA/kV was utilized to reduce the radiation dose to as low as reasonably achievable. CONTRAST: 75 mL of Omnipaque  350. COMPARISON: CT Abdomen Pelvis 11/06/2023. CLINICAL HISTORY: Acute abdominal pain. FINDINGS: LOWER CHEST: No  acute abnormality. LIVER: The liver is unremarkable. GALLBLADDER AND BILE DUCTS: Status post cholecystectomy. No biliary ductal dilatation. SPLEEN: No acute abnormality. PANCREAS: No acute abnormality. ADRENAL GLANDS: No acute abnormality. KIDNEYS, URETERS AND BLADDER: No stones in the kidneys or ureters. No hydronephrosis. No perinephric or periureteral stranding. Urinary bladder is unremarkable. GI AND BOWEL: Appendix normal. The stomach, small bowel, and large bowel are otherwise unremarkable. PERITONEUM AND RETROPERITONEUM: Trace free fluid within the pelvis is nonspecific, possibly physiologic in Arial Galligan patient of this age. No free air. VASCULATURE: Aorta is normal in caliber. LYMPH NODES: No lymphadenopathy. REPRODUCTIVE ORGANS: Corpus luteum noted within the left adnexa. BONES AND SOFT TISSUES: No acute osseous abnormality. No focal soft tissue abnormality. IMPRESSION: 1. No acute findings in the abdomen or pelvis Electronically signed by: Dorethia Molt MD 03/25/2024 01:43 AM EST RP Workstation: [id number]        Scheduled Meds:  alum & mag hydroxide-simeth  30 mL Oral Once   And   lidocaine   15 mL Oral Once   ARIPiprazole   15 mg Oral Daily   diclofenac  Sodium  2 g Topical QID   feeding supplement  237 mL Oral BID BM   fluticasone  furoate-vilanterol  1 puff Inhalation Daily   pantoprazole   40 mg Oral BID AC   sertraline   100 mg Oral Daily   Continuous Infusions:  promethazine  (PHENERGAN ) injection (IM or IVPB) 6.25 mg (03/23/24 2334)     LOS: 5 days    Time spent: over 30 min    Meliton Monte, MD [facility]   To contact the attending provider between 7A-7P or the covering provider during after hours 7P-7A, please log into the web site [email or web address] and access using universal [place] password for that web site. If you do not have the password, please call the hospital operator.  03/25/2024, 7:02 PM

## 2024-03-25 NOTE — TOC Progression Note (Signed)
 Transition of Care [facility]) - Progression Note    Patient Details  Name: Amy Wall MRN: [id number] Date of Birth: 12/08/2005  [facility]) CM/SW Contact  Dino CHRISTELLA Au, LCSWA Phone Number: 03/25/2024, 12:13 PM  Clinical Narrative:      SW left VM with [facility] Intake [phone]) regarding bed availability.    Social Drivers of Health (SDOH) Interventions SDOH Screenings   Food Insecurity: No Food Insecurity (03/20/2024)  Housing: Low Risk  (03/20/2024)  Transportation Needs: Unmet Transportation Needs (03/20/2024)  Utilities: Not At Risk (03/20/2024)  Alcohol Screen: Low Risk  (11/03/2023)  Depression (PHQ2-9): Low Risk  (10/20/2023)  Financial Resource Strain: Medium Risk (06/28/2022)  Physical Activity: Inactive (06/28/2022)  Social Connections: Socially Isolated (03/20/2024)  Stress: No Stress Concern Present (06/28/2022)  Tobacco Use: Medium Risk (03/20/2024)    Readmission Risk Interventions     No data to display

## 2024-03-25 NOTE — Consult Note (Signed)
 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  The patient was seen in consultation by Dr. Lorriane on 03/19/2024.  He recommended inpatient admission to an acute psychiatric facility for further stabilization.  The patient agreed for involuntary admission and the recommendation was to IVC her if she changes her mind.  However it appears that the patient was IVC in the ED to ensure that she gets up proper treatment and does not change her mind.  The patient was initially quite upset about the fact that she was IVC.  The patient was seen by this writer to provide additional information and reassurance over the plan.  Apparently the patient has already been accepted by [facility] but before she could be transferred, she was admitted to the medical floor for stabilization of her status.  She is currently not yet medically cleared.   03/20/2024:  Mental status examination: When seen today, the patient was lying in bed getting an IV access.  She is on one-on-one observation and the charge nurse was present.   She is alert oriented and cooperative and fairly pleasant but somewhat frustrated that despite her asking for voluntary admission, she was IVC in the ED. She maintained good eye contact.  She is oriented x 4.  Although she admits to depression and taking the overdose, no active SI/HI/AVH noted currently.  There is no psychomotor retardation or agitation.  She understands that she will remain on the IVC at this time.  She is cognitively intact.  03/21/2024: The patient was seen and reevaluated today.  The chart was reviewed.  Received  information that the patient is medically cleared today.  When examined she was reporting that she had significant conflicts prior to admission when an ex-boyfriend was released from jail have apparently been threatening.  She got very frustrated and apparently texted that she is rather kill herself than someone else out.  On examination today the patient is alert oriented and cooperative.  Her affect is dysthymic her speech is coherent without any obvious looseness of association or flight of ideas or tangentiality.  Although she continues to contract for safety she reports that she had relapsed and this was the first time she had actually hurt herself after being able to prevent herself from cutting for over a year.  While in the hospital she is contracting for safety.  03/22/2024: The patient remains on IVC.  Staff reports no behavioral problems.  She is still on one-on-one observation.  Awaiting placement and information has been faxed out.  Patient does have a legal guardian.  Patient had a few episodes of agitation and anxiety and last night received as needed medications for sinus tachycardia she was started on Cardizem drip overnight.  This was weaned off this morning.  On examination patient is alert oriented cooperative but dysthymic.  Her speech is coherent without any obvious looseness of associations flight of ideas or tangentiality.  She reports that she occasionally uses Estacy , but during this episode she took it as an intentional overdose and thinks that it was laced with methamphetamine.  She claims that she does not use meth normally.  She is no longer in contact with her ex-boyfriend.  She is contracting for safety.  03/23/2024:  The patient was seen and evaluated today.  The chart was reviewed.  She is scheduled for discharge to [facility] yesterday but unfortunately it did not happen logistically.  She is still on the IVC.However records indicate that patient still has some  vomiting and having some seizures earlier this morning.  Patient is scheduled for EEG at 8 AM Today.  Patient still has a comptroller. On examination today the patient was alert somewhat withdrawn with a blunted affect and with minimal interaction.  She maintained poor eye contact.  Will continue to monitor her from a psychiatric perspective until stabilized medically.  03/24/2024 (Time spent: 30 minutes) Patient seen face to face in her hospital room with 1:1 sitter at bedside. Patient is awake, alert and oriented x 4. Patient reports she had a seizure episode yesterday morning, she reports being confused after the event and could not remember details of what happened. Patient denies any seizure episodes in the past 24 hours. On further questioning, patient reports previous seizure-like episode about a year ago during which she was started on Depakote  and became seizure free thereafter. However, patient states she was asked to stop the medication because the doctor did not believe she had true seizure.   Patient does not know her current psychotropic medications but states she needs medications for depression, anxiety and sleep. She reports following up with an ACT team but does not remember the name. Patient would like to be discharged to an inpatient mental health center at [facility]) once she's medically cleared.    On examination, she was calm but somewhat guarded with poor eye contact. She appeared depressed with constricted affect.However, no perceptual disturbances or SI/HI.     03/25/2024: Patient seen face to face in her hospital room with 1:1 sitter at bedside. Patient is awake, alert and oriented to time, place, person and situation. She reports sleeping well overnight with adequate appetite. Patient reports compliance with current medications and reports improved mood, energy level, and motivation. She is still open to transfer to inpatient psychiatric unit for further  stabilization. Patient denies suicidal or homicidal ideation, intent or plan.   On examination, patient is calm and cooperative with no psychomotor abnormalities. Her speech is full volume, but mood is stressed with constricted affect. Thought process is logical and coherent, thought content is devoid of delusions with no perceptual disturbances.    Plan/Recommendations:  Continue Abilify  15 mg daily for mood and Trazodone  25 mg at bedtime for sleep.  Patient is currently on IVC and will continue to seek placement to psychiatric facility. Patient had new onset seizures and is being monitored currently. The patient will continue on one-on-one observation.  Will continue to follow until she is accepted to a psychiatric facility. Was advised to stay away from drugs which could further precipitate seizure and worsen her mental health symptoms.     Jan Donath, MD Psychiatrist.

## 2024-03-25 NOTE — Plan of Care (Signed)
   Problem: Education: Goal: Knowledge of General Education information will improve Description: Including pain rating scale, medication(s)/side effects and non-pharmacologic comfort measures Outcome: Progressing   Problem: Clinical Measurements: Goal: Ability to maintain clinical measurements within normal limits will improve Outcome: Progressing Goal: Will remain free from infection Outcome: Progressing   Problem: Activity: Goal: Risk for activity intolerance will decrease Outcome: Progressing   Problem: Nutrition: Goal: Adequate nutrition will be maintained Outcome: Progressing   Problem: Pain Managment: Goal: General experience of comfort will improve and/or be controlled Outcome: Progressing   Problem: Safety: Goal: Ability to remain free from injury will improve Outcome: Progressing

## 2024-03-25 NOTE — Plan of Care (Signed)
  Problem: Pain Managment: Goal: General experience of comfort will improve and/or be controlled Outcome: Progressing   Problem: Nutrition: Goal: Adequate nutrition will be maintained Outcome: Adequate for Discharge   Problem: Coping: Goal: Level of anxiety will decrease Outcome: Adequate for Discharge   Problem: Safety: Goal: Ability to remain free from injury will improve Outcome: Adequate for Discharge

## 2024-03-26 ENCOUNTER — Other Ambulatory Visit (HOSPITAL_COMMUNITY): Payer: Self-pay

## 2024-03-26 DIAGNOSIS — R000 Tachycardia, unspecified: Secondary | ICD-10-CM | POA: Diagnosis not present

## 2024-03-26 MED ORDER — FLUTICASONE PROPIONATE 50 MCG/ACT NA SUSP
1.0000 | Freq: Every day | NASAL | Status: DC
  Administered 2024-03-26: 1 via NASAL
  Filled 2024-03-26: qty 16

## 2024-03-26 MED ORDER — SALINE SPRAY 0.65 % NA SOLN: 1.0000 | NASAL | Status: DC | PRN

## 2024-03-26 MED ORDER — TRAZODONE HCL 50 MG PO TABS
25.0000 mg | ORAL_TABLET | Freq: Every evening | ORAL | 0 refills | Status: AC | PRN
  Filled 2024-03-26: qty 15, 30d supply, fill #0

## 2024-03-26 NOTE — TOC Transition Note (Signed)
 Transition of Care [facility]) - Discharge Note   Patient Details  Name: Amy Wall MRN: [id number] Date of Birth: [birth year]  Transition of Care [facility]) CM/SW Contact:  Bridget Cordella Simmonds, LCSW Phone Number: 03/26/2024, 3:33 PM   Clinical Narrative:   At request of legal guardian [facility], pt discharging to [facility], [address], [place], [place] [address].  Pt Act team has been contacted to resume services.    Final next level of care: Home/Self Care (hotel) Barriers to Discharge: Barriers Resolved   Patient Goals and CMS Choice            Discharge Placement                Patient to be transferred to facility by: taxi Name of family member notified: [name] Jones/[place]/legal guardian Patient and family notified of of transfer: 03/26/24  Discharge Plan and Services Additional resources added to the After Visit Summary for                                       Social Drivers of Health (SDOH) Interventions SDOH Screenings   Food Insecurity: No Food Insecurity (03/20/2024)  Housing: Low Risk  (03/20/2024)  Transportation Needs: Unmet Transportation Needs (03/20/2024)  Utilities: Not At Risk (03/20/2024)  Alcohol Screen: Low Risk  (11/03/2023)  Depression (PHQ2-9): Low Risk  (10/20/2023)  Financial Resource Strain: Medium Risk (06/28/2022)  Physical Activity: Inactive (06/28/2022)  Social Connections: Socially Isolated (03/20/2024)  Stress: No Stress Concern Present (06/28/2022)  Tobacco Use: Medium Risk (03/20/2024)     Readmission Risk Interventions     No data to display

## 2024-03-26 NOTE — Consult Note (Addendum)
 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  The patient was seen in consultation by Dr. Lorriane on 03/19/2024.  He recommended inpatient admission to an acute psychiatric facility for further stabilization.  During this time patient was involuntarily committed by the emergency department physician due to concerns around suicide attempt with patient's intentional overdose.  Patient was medically stabilized and was accepted to [facility] for transfer on 11/13 but prior to leaving experienced a seizure.  Patient received a thorough medical workup until she was determined to be medically stable again today on 11/17.  Patient interview: On interview patient today she reports that she is feeling better and would prefer not to go to the inpatient hospital.  Says that she was having a lot of substance issues prior to admission and that she has had a lot of time to think while being here and realizes the negative effect that substances have on her and that she wants to stop them.  Patient does say that she is not interested in doing residential rehab.  She reports that she has an ACT team and she would prefer to follow-up with them in the community.  She denies any thoughts to harm herself or others.  Denies any cravings to the substances.  Denies any side effects to her psychiatric medications including akathisia. Pt denies extrapyramidal symptoms including dystonia (sudden spastic contractions of muscle groups), parkinsonism (bradykinesia, tremors, rigidity), and akathisia (severe restlessness).  Discussed with patient that we would need to talk with her legal guardian regarding discharge.  Spoke with patient again after talking with [name] and patient is still not  open to doing substance use treatment including residential treatment.  Patient is not open to doing inpatient unless she is involuntary.   Collateral 11/17, legal guardian, Shelba Molt, [phone]: Discussed with [name] the hospital course briefly.  Discussed that patient currently appears psychiatrically stable and was asking her thoughts on community follow-up with ACT team.  [name] says that she is agreeable to this and thinks that is a safe plan but would like to have us  talk with patient once more around substance treatment.  Discussed plan to talk with patient again and follow-up with [name].  After talking with patient again, discussed with [name] that patient does not want to pursue residential treatment after hospitalization if pursued.  Shelba reported that if patient does not want to do substance treatment that she is agreeable to patient following up with ACT team but says that she needs to secure a hotel for patient as patient does not have a place to stay currently.  Reports that she would work on this today.  Discussed that the social worker at hospital would follow-up with her regarding discharge planning once hotel has been secured.  Informed [name] that this provider would reach out to our team to provide appropriate handoff.   Collateral, ACTT, 11/17 Abby, team lead, [phone] [facility] worker, [phone] Office number, [phone] Called all 3 numbers multiple times including office phone x 3 and left voicemail with this provider's contact to provide handoff to our team.  All calls were done during their stated hours of operation of M-F 9a-4p.   Objective Physical Exam Vitals and nursing note reviewed.  Constitutional:      General: She is not in acute distress. HENT:  Head: Normocephalic and atraumatic.  Pulmonary:     Effort: Pulmonary effort is normal.  Neurological:     General: No focal deficit present.     Mental Status: She is alert.      Comments: No EPS.  Psychiatric:        Attention and Perception: Attention and perception normal.        Mood and Affect: Mood and affect normal.        Speech: Speech normal.        Behavior: Behavior normal. Behavior is cooperative.        Thought Content: Thought content normal.        Cognition and Memory: Cognition and memory normal.        Judgment: Judgment normal.    Assessment Since remained the hospital for 7 days patient has had significant proven in her mood and is no longer experiencing any concern for depression that would warrant inpatient treatments.  Patient has low suicide risk at this time as she has improvement in her overall mental health, is interested in stopping substances, has good close outpatient follow-up, and legal guardian was in agreement with these plans.  Patient would benefit from substance treatment but is not interested at this time and would require patient be involuntary for her to go to residential treatment.  Patient is tolerating Abilify  15 and trazodone  without side effects and can continue discharge with continued titration per ACT team.  Working diagnoses: Amphetamine  use disorder, moderate Cannabis use disorder, moderate Substance induced mood disorder PTSD  Plan/Recommendations:  Continue Abilify  15 mg daily for mood/psychosis Continue Trazodone  25 mg at bedtime for sleep Continue sertraline  100 mg daily for mood Discontinue IVC as patient is no longer in imminent harm to herself or others Discontinue one-to-one observation Follow-up with ACT team for outpatient services; this provider has left a voicemail for handoff and ensure follow-up, as none of contact including ACTT office have responded.  Legal guardian has been notified about discharge and she is currently finding new hotel for patient to stay  Psychiatry will sign off at this time.  Please reconsult with any concerns.  Primary team and LCSW have been notified about this  information.  Justino Cornish, MD PGY-2 Psychiatry Resident 03/26/2024, 2:03 PM

## 2024-03-26 NOTE — TOC Progression Note (Addendum)
 Transition of Care [facility]) - Progression Note    Patient Details  Name: Amy Wall MRN: [id number] Date of Birth: [birth year]  [facility] CM/SW Contact  Bridget Cordella Simmonds, LCSW Phone Number: 03/26/2024, 10:18 AM  Clinical Narrative:   Message left with admissions at [facility] regarding bed availability for today.   1030: TC [facility]: needs MD notes faxed to [phone].  1045: CSW informed that psych team is clearing pt for DC to her ACT team, will rescind IVC.   1400: Message left with pt legal guardian Shelba Molt requesting information on what hotel pt will be staying at.   1510: TC Shelba Molt.  Pt will be staying at [facility], [address], [facility] [address].  Shelba reports she cannot transport pt, need her to be put in a cab.  Per Shelba, pt belongings are with her ACT team.    1550: IVS rescind form uploaded to [employer], envelope #[id number].  Hard copy placed on chart.                    Expected Discharge Plan and Services                                               Social Drivers of Health (SDOH) Interventions SDOH Screenings   Food Insecurity: No Food Insecurity (03/20/2024)  Housing: Low Risk  (03/20/2024)  Transportation Needs: Unmet Transportation Needs (03/20/2024)  Utilities: Not At Risk (03/20/2024)  Alcohol Screen: Low Risk  (11/03/2023)  Depression (PHQ2-9): Low Risk  (10/20/2023)  Financial Resource Strain: Medium Risk (06/28/2022)  Physical Activity: Inactive (06/28/2022)  Social Connections: Socially Isolated (03/20/2024)  Stress: No Stress Concern Present (06/28/2022)  Tobacco Use: Medium Risk (03/20/2024)    Readmission Risk Interventions     No data to display

## 2024-03-26 NOTE — Plan of Care (Signed)
  Problem: Education: Goal: Knowledge of General Education information will improve Description: Including pain rating scale, medication(s)/side effects and non-pharmacologic comfort measures 03/26/2024 1503 by Lynnette Cena CROME, RN Outcome: Adequate for Discharge 03/26/2024 1254 by Lynnette Cena CROME, RN Outcome: Progressing   Problem: Health Behavior/Discharge Planning: Goal: Ability to manage health-related needs will improve Outcome: Adequate for Discharge   Problem: Clinical Measurements: Goal: Ability to maintain clinical measurements within normal limits will improve Outcome: Adequate for Discharge Goal: Will remain free from infection Outcome: Adequate for Discharge Goal: Diagnostic test results will improve Outcome: Adequate for Discharge Goal: Respiratory complications will improve Outcome: Adequate for Discharge Goal: Cardiovascular complication will be avoided Outcome: Adequate for Discharge   Problem: Activity: Goal: Risk for activity intolerance will decrease Outcome: Adequate for Discharge   Problem: Nutrition: Goal: Adequate nutrition will be maintained Outcome: Adequate for Discharge   Problem: Coping: Goal: Level of anxiety will decrease Outcome: Adequate for Discharge   Problem: Elimination: Goal: Will not experience complications related to bowel motility Outcome: Adequate for Discharge Goal: Will not experience complications related to urinary retention Outcome: Adequate for Discharge   Problem: Pain Managment: Goal: General experience of comfort will improve and/or be controlled Outcome: Adequate for Discharge   Problem: Safety: Goal: Ability to remain free from injury will improve Outcome: Adequate for Discharge   Problem: Skin Integrity: Goal: Risk for impaired skin integrity will decrease Outcome: Adequate for Discharge

## 2024-03-26 NOTE — Plan of Care (Signed)
   Problem: Education: Goal: Knowledge of General Education information will improve Description Including pain rating scale, medication(s)/side effects and non-pharmacologic comfort measures Outcome: Progressing

## 2024-03-26 NOTE — Progress Notes (Signed)
Discharge instructions, RX's and follow up appts explained and provided to patient verbalized understanding. ° °[name] Pfahler Lynn, RN ° °

## 2024-03-26 NOTE — Discharge Summary (Signed)
 Physician Discharge Summary  Amy Wall FMW:[id number] DOB: 2005-12-31 DOA: 03/19/2024  PCP: Pcp, No  Admit date: 03/19/2024 Discharge date: 03/26/2024  Time spent: 40 minutes  Recommendations for Outpatient Follow-up:  Follow outpatient CBC/CMP  Follow with psychiatry outpatient  Follow with neurology outpatient   Discharge Diagnoses:  Principal Problem:   Sinus tachycardia   Discharge Condition: stable  Diet recommendation: heart healthy  Filed Weights   03/19/24 1446  Weight: 54.4 kg    History of present illness:   18 y.o. female with medical history significant of MDD, PTSD, HTN, and GERD p/w suicide attempt c/b sinus tachycardia after methamphetamine ingestion.  Upon admission noted to have sinus tachycardia, UDS positive for amphetamine /THC.  MRI negative for stroke.  Psychiatry team was consulted.  Initially patient had to be started on Cardizem drip but overnight her heart rate improved and Cardizem drip was weaned off.    Hospitalization c/b seizure like activity with negative EEG.  Also N/V with unremarkable CT scan.  Plan initially was for inpatient psych placement.  Seen by psych on 11/17 who discontinued the IVC and noted they were stable for discharge.  Patient was arranged [facility] by the guardian and psych reached out to ACT team.       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 No aed at this time.  Will refer to neurology outpatient.   Per [place]  DM[place] statutes, patients with seizures are not allowed to drive until  they have been seizure-free for six months. Use caution when using heavy equipment or power tools. Avoid working on ladders or at  heights. Take showers instead of baths. Ensure the water  temperature is not too high on the home water  heater. Do not go swimming alone. When caring for infants or small children, sit down when holding, feeding, or changing them to minimize risk of injury to the child in the event you have Amy Wall seizure. Also, Maintain good sleep hygiene. Avoid alcohol.   Nausea  Vomiting  Abdominal Pain Symptomatic management Lipase wnl, LFT's wnl CT abd pelvis unremarkable Symptoms resolved   Chest Pain Reproducible with palpation Troponins negative, EKG reassuring Trial voltaren  to chest discomfort resolved   Sinus tachycardia secondary to polysubstance abuse Intentional Overdose Hypokalemia Lactic acidosis, resolved Patient ingested methamphetamine.  UDS was also positive for cocaine.  Poison control was notified.  MRI brain and CT head is negative, alcohol level negative, UA is negative, CK levels are normal.  Cardizem drip has been turned off and medically she is feeling much better.  Medically cleared from this standpoint. Cleared for discharge by psych today   Hypokalemia - As needed repletion   MDD/PTSD Concerns of suicide attempt -Seen by psychiatry - ok to d/c IVC and discharge today.  Continue abilify  and trazodone .    GERD Continue PPI twice daily     Procedures: EEG MPRESSION: This study is within normal limits. No seizures or epileptiform discharges were seen throughout the recording.   Jenisse Vullo normal interictal EEG does not exclude the diagnosis of epilepsy.  Consultations: Psychiatry neurology  Discharge Exam: Vitals:   03/25/24 1930 03/26/24 0745  BP: 122/73 114/74  Pulse: 61 80  Resp: 17 17  Temp: 98.6 F (37 C) 99.1  F (37.3 C)  SpO2: 100% 100%   No complaints Eager to discharge  General: No acute distress. Lungs: unlabored Neurological: Alert and oriented 3. Moves all extremities 4 with equal strength. Cranial nerves II through XII grossly  intact. Extremities: No clubbing or cyanosis. No edema.   Discharge Instructions   Discharge Instructions     Ambulatory referral to Neurology   Complete by: As directed    An appointment is requested in approximately: 4 weeks   Call MD for:  difficulty breathing, headache or visual disturbances   Complete by: As directed    Call MD for:  extreme fatigue   Complete by: As directed    Call MD for:  hives   Complete by: As directed    Call MD for:  persistant dizziness or light-headedness   Complete by: As directed    Call MD for:  persistant nausea and vomiting   Complete by: As directed    Call MD for:  redness, tenderness, or signs of infection (pain, swelling, redness, odor or green/yellow discharge around incision site)   Complete by: As directed    Call MD for:  severe uncontrolled pain   Complete by: As directed    Call MD for:  temperature >100.4   Complete by: As directed    Diet - low sodium heart healthy   Complete by: As directed    Discharge instructions   Complete by: As directed    You were seen for an overdose.    You've improved with supportive care.  You should follow up with psychiatry as an outpatient and avoid illicit substances in the future.   Your hospital course was complicated by Kashmere Staffa seizure like episode.  This may have been related to your overdose.  I'll refer you to neurology outpatient.   You also had nausea and vomiting, which has now resolved.  Your CT scan was reassuring.   Per [place]  DMV statutes, patients with seizures are not allowed to drive until  they have been seizure-free for six months. Use caution when using heavy equipment or power tools. Avoid working on ladders or at heights. Take showers instead of baths. Ensure the water  temperature is not too high on the home water  heater. Do not go swimming alone. When caring for infants or small children, sit down when holding, feeding, or changing them to minimize risk of injury to the  child in the event you have Amy Wall seizure. Also, Maintain good sleep hygiene. Avoid alcohol.  Return for new, recurrent, or worsening symptoms.  Please ask your PCP to request records from this hospitalization so they know what was done and what the next steps will be.   Increase activity slowly   Complete by: As directed       Allergies as of 03/26/2024       Reactions   Chocolate Anaphylaxis, Itching, Swelling, Other (See Comments)   Throat closes   Peanut-containing Drug Products Anaphylaxis, Hives, Itching, Other (See Comments)   Pineapple Anaphylaxis, Hives   Shellfish Allergy Anaphylaxis, Hives   Shellfish Protein-containing Drug Products Other (See Comments)   Strawberry Extract Anaphylaxis, Hives   Charentais Melon (french Melon) Swelling, Other (See Comments)   Numbness and swelling of throat and tongue, also        Medication List     STOP taking these medications    megestrol  40 MG tablet Commonly known as: MEGACE    metroNIDAZOLE  500 MG tablet Commonly known as: FLAGYL   TAKE these medications    acetaminophen  500 MG tablet Commonly known as: TYLENOL  Take 1,000 mg by mouth every 6 (six) hours as needed for mild pain (pain score 1-3) or moderate pain (pain score 4-6).   albuterol  108 (90 Base) MCG/ACT inhaler Commonly known as: VENTOLIN  HFA Inhale 1-2 puffs into the lungs every 6 (six) hours as needed for wheezing or shortness of breath.   ARIPiprazole  15 MG tablet Commonly known as: ABILIFY  Take 1 tablet (15 mg total) by mouth daily.   budesonide -formoterol  80-4.5 MCG/ACT inhaler Commonly known as: SYMBICORT  Inhale 2 puffs into the lungs 2 (two) times daily.   calcium  carbonate 500 MG chewable tablet Commonly known as: TUMS - dosed in mg elemental calcium  Chew 1 tablet (200 mg of elemental calcium  total) by mouth 3 (three) times daily with meals.   cromolyn 4 % ophthalmic solution Commonly known as: OPTICROM SMARTSIG:1 Drop(s) In Eye(s) Twice  Daily PRN   fluticasone  furoate-vilanterol 100-25 MCG/ACT Aepb Commonly known as: BREO ELLIPTA  Inhale 1 puff into the lungs daily.   ibuprofen  200 MG tablet Commonly known as: ADVIL  Take 200 mg by mouth every 6 (six) hours as needed for mild pain (pain score 1-3) or moderate pain (pain score 4-6).   nicotine  polacrilex 2 MG gum Commonly known as: NICORETTE  Take 1 each (2 mg total) by mouth as needed for smoking cessation.   nystatin cream Commonly known as: MYCOSTATIN Apply topically.   pantoprazole  40 MG tablet Commonly known as: PROTONIX  Take 1 tablet (40 mg total) by mouth 2 (two) times daily before [place] Hodgkin meal.   sertraline  100 MG tablet Commonly known as: ZOLOFT  Take 1 tablet (100 mg total) by mouth daily.       Allergies  Allergen Reactions   Chocolate Anaphylaxis, Itching, Swelling and Other (See Comments)    Throat closes   Peanut-Containing Drug Products Anaphylaxis, Hives, Itching and Other (See Comments)   Pineapple Anaphylaxis and Hives   Shellfish Allergy Anaphylaxis and Hives   Shellfish Protein-Containing Drug Products Other (See Comments)   Strawberry Extract Anaphylaxis and Hives   Charentais Melon (French Melon) Swelling and Other (See Comments)    Numbness and swelling of throat and tongue, also      The results of significant diagnostics from this hospitalization (including imaging, microbiology, ancillary and laboratory) are listed below for reference.    Significant Diagnostic Studies: CT ABDOMEN PELVIS W CONTRAST Result Date: 03/25/2024 EXAM: CT ABDOMEN AND PELVIS WITH CONTRAST 03/25/2024 01:09:58 AM TECHNIQUE: CT of the abdomen and pelvis was performed with the administration of intravenous contrast. Multiplanar reformatted images are provided for review. Automated exposure control, iterative reconstruction, and/or weight-based adjustment of the mA/kV was utilized to reduce the radiation dose to as low as reasonably achievable. CONTRAST: 75 mL of  Omnipaque  350. COMPARISON: CT Abdomen Pelvis 11/06/2023. CLINICAL HISTORY: Acute abdominal pain. FINDINGS: LOWER CHEST: No acute abnormality. LIVER: The liver is unremarkable. GALLBLADDER AND BILE DUCTS: Status post cholecystectomy. No biliary ductal dilatation. SPLEEN: No acute abnormality. PANCREAS: No acute abnormality. ADRENAL GLANDS: No acute abnormality. KIDNEYS, URETERS AND BLADDER: No stones in the kidneys or ureters. No hydronephrosis. No perinephric or periureteral stranding. Urinary bladder is unremarkable. GI AND BOWEL: Appendix normal. The stomach, small bowel, and large bowel are otherwise unremarkable. PERITONEUM AND RETROPERITONEUM: Trace free fluid within the pelvis is nonspecific, possibly physiologic in Kiosha Buchan patient of this age. No free air. VASCULATURE: Aorta is normal in caliber. LYMPH NODES: No lymphadenopathy. REPRODUCTIVE ORGANS: Corpus luteum noted within  the left adnexa. BONES AND SOFT TISSUES: No acute osseous abnormality. No focal soft tissue abnormality. IMPRESSION: 1. No acute findings in the abdomen or pelvis Electronically signed by: Dorethia Molt MD 03/25/2024 01:43 AM EST RP Workstation: [id number]   EEG adult Result Date: 03/23/2024 Shelton Arlin KIDD, MD     03/23/2024  4:28 PM Patient Name: [name] DELANCEY MRN: [id number] Epilepsy Attending: Arlin KIDD Shelton Referring Physician/Provider: Andrez Chroman, NP Date: 03/23/2024 Duration: 23.09 mins Patient history: 18 year old female with seizure-like activity.  EEG evaluate for seizure. Level of alertness: Awake AEDs during EEG study: Ativan  Technical aspects: This EEG study was done with scalp electrodes positioned according to the 10-20 International system of electrode placement. Electrical activity was reviewed with band pass filter of 1-70Hz , sensitivity of 7 uV/mm, display speed of 10mm/sec with Hermen Mario 60Hz  notched filter applied as appropriate. EEG data were recorded continuously and digitally stored.  Video monitoring was  available and reviewed as appropriate. Description: The posterior dominant rhythm consists of 9-10 Hz activity of moderate voltage (25-35 uV) seen predominantly in posterior head regions, symmetric and reactive to eye opening and eye closing. Physiologic photic driving was not seen during photic stimulation.  Hyperventilation was not performed.   IMPRESSION: This study is within normal limits. No seizures or epileptiform discharges were seen throughout the recording. Sky Primo normal interictal EEG does not exclude the diagnosis of epilepsy. Arlin KIDD Shelton   MR BRAIN WO CONTRAST Result Date: 03/19/2024 CLINICAL DATA:  Initial evaluation for mental status change, unknown cause. EXAM: MRI HEAD WITHOUT CONTRAST TECHNIQUE: Multiplanar, multiecho pulse sequences of the brain and surrounding structures were obtained without intravenous contrast. COMPARISON:  CT from earlier the same day as well as previous MRI from 01/25/2020 FINDINGS: Brain: Examination somewhat technically limited by susceptibility artifact from dental hardware. Cerebral volume within normal limits. No focal parenchymal signal abnormality. No evidence for acute or subacute infarct. Gray-white matter differentiation maintained. No areas of chronic cortical infarction or other insult. No visible acute or chronic intracranial blood products. No mass lesion, midline shift or mass effect. No hydrocephalus or extra-axial fluid collection. Pituitary gland and suprasellar region within normal limits. No intrinsic temporal lobe abnormality. Vascular: Major intracranial vascular flow voids are maintained. Skull and upper cervical spine: Craniocervical junction within normal limits. Bone marrow signal intensity within normal limits for age. No scalp soft tissue abnormality. Sinuses/Orbits: Globes orbital soft tissues within normal limits. Paranasal sinuses are clear. No mastoid effusion. Other: None. IMPRESSION: Normal brain MRI. No acute intracranial abnormality  identified. Electronically Signed   By: Morene Hoard M.D.   On: 03/19/2024 23:37   CT HEAD CODE STROKE WO CONTRAST Result Date: 03/19/2024 EXAM: CT HEAD WITHOUT CONTRAST 03/19/2024 07:43:58 PM TECHNIQUE: CT of the head was performed without the administration of intravenous contrast. Automated exposure control, iterative reconstruction, and/or weight based adjustment of the mA/kV was utilized to reduce the radiation dose to as low as reasonably achievable. COMPARISON: MRI head 01/25/2020 CLINICAL HISTORY: Neuro deficit, acute, stroke suspected FINDINGS: BRAIN AND VENTRICLES: No acute hemorrhage. No evidence of acute infarct. No hydrocephalus. No extra-axial collection. No mass effect or midline shift. ORBITS: No acute abnormality. SINUSES: No acute abnormality. SOFT TISSUES AND SKULL: No acute soft tissue abnormality. No skull fracture. [place] Stroke Program Early CT (ASPECT) Score: Ganglionic (caudate, IC, lentiform nucleus, insula, M1-M3): 7 Supraganglionic (M4-M6): 3 Total: 10 IMPRESSION: 1. No acute intracranial abnormality. 2. ASPECTS 10. 3. Findings messaged to Dr. Vanessa at 7:57 PM on 03/19/24. Electronically  signed by: Donnice Mania MD 03/19/2024 07:57 PM EST RP Workstation: [id number]    Microbiology: Recent Results (from the past 240 hours)  SARS Coronavirus 2 by RT PCR (hospital order, performed in [facility] hospital lab) *cepheid single result test* Anterior Nasal Swab     Status: None   Collection Time: 03/22/24  2:45 PM   Specimen: Anterior Nasal Swab  Result Value Ref Range Status   SARS Coronavirus 2 by RT PCR NEGATIVE NEGATIVE Final    Comment: Performed at [facility], [address], [place], [place] [address]     Labs: Basic Metabolic Panel: Recent Labs  Lab 03/19/24 1558 03/21/24 0431 03/24/24 0128 03/25/24 1108  NA 137 140 138 141  K 3.5 3.3* 2.9* 3.4*  CL 102 109 102 107  CO2 21* 18* 25 24  GLUCOSE 106* 77 86 79  BUN 11 11 10 12   CREATININE  0.57 0.58 0.64 0.62  CALCIUM  9.3 8.5* 8.9 9.4   Liver Function Tests: Recent Labs  Lab 03/19/24 1558 03/24/24 0128  AST 23 13*  ALT 12 13  ALKPHOS 74 64  BILITOT 0.8 0.4  PROT 7.9 6.3*  ALBUMIN 4.2 3.3*   Recent Labs  Lab 03/19/24 1558 03/24/24 0128  LIPASE 25 29   No results for input(s): AMMONIA in the last 168 hours. CBC: Recent Labs  Lab 03/19/24 1558 03/24/24 0128  WBC 8.3 6.0  NEUTROABS 6.2 3.1  HGB 12.7 11.0*  HCT 36.9 30.9*  MCV 87.9 86.1  PLT 321 295   Cardiac Enzymes: Recent Labs  Lab 03/20/24 0140  CKTOTAL 101   BNP: BNP (last 3 results) No results for input(s): BNP in the last 8760 hours.  ProBNP (last 3 results) No results for input(s): PROBNP in the last 8760 hours.  CBG: Recent Labs  Lab 03/19/24 2044 03/22/24 1244 03/24/24 0246  GLUCAP 86 79 98       Signed:  Meliton Monte MD.  [facility] 03/26/2024, 2:56 PM

## 2024-04-19 ENCOUNTER — Ambulatory Visit: Payer: Self-pay

## 2024-04-19 ENCOUNTER — Ambulatory Visit: Payer: MEDICAID | Admitting: Family Medicine

## 2024-04-19 ENCOUNTER — Emergency Department (HOSPITAL_COMMUNITY)
Admission: EM | Admit: 2024-04-19 | Discharge: 2024-04-19 | Disposition: A | Discharge status: Home / Self Care | Payer: MEDICAID | Attending: Emergency Medicine | Admitting: Emergency Medicine

## 2024-04-19 ENCOUNTER — Encounter (HOSPITAL_COMMUNITY): Payer: Self-pay

## 2024-04-19 DIAGNOSIS — R200 Anesthesia of skin: Secondary | ICD-10-CM | POA: Insufficient documentation

## 2024-04-19 DIAGNOSIS — Z4802 Encounter for removal of sutures: Secondary | ICD-10-CM | POA: Insufficient documentation

## 2024-04-19 DIAGNOSIS — M79641 Pain in right hand: Secondary | ICD-10-CM | POA: Insufficient documentation

## 2024-04-19 DIAGNOSIS — Z5321 Procedure and treatment not carried out due to patient leaving prior to being seen by health care provider: Secondary | ICD-10-CM | POA: Insufficient documentation

## 2024-04-19 NOTE — ED Notes (Signed)
 Pt notified staff she could no longer wait. Pt encouraged to stay but reports she is unable. Ambulated with steady gait and appeared to be in no distress.

## 2024-04-19 NOTE — ED Triage Notes (Signed)
 Quick triage note : pt reports injury with glass after punching a window earlier this month, had stitches placed, had some removed, but here today to get remaining removed, pt also c/o of pain and numbness to right hand x 4 days, pt has full ROM.

## 2024-04-19 NOTE — ED Triage Notes (Signed)
See Quick Triage note.

## 2024-04-19 NOTE — Telephone Encounter (Signed)
 FYI Only or Action Required?: FYI only for provider: appointment scheduled on 05/15/24. NP appt  Patient was last seen in primary care on 10/20/2023 by Billy Knee, FNP.  Called Nurse Triage reporting Arm Injury.  Symptoms began several weeks ago.  Interventions attempted: OTC medications: ibuprofen  and Prescription medications: Injection of abx and bactrim.  Symptoms are: gradually worsening.  Triage Disposition: Go to ED Now (or PCP Triage)  Patient/caregiver understands and will follow disposition?: Yes    Copied from CRM #[id number]. Topic: Clinical - Red Word Triage >> Apr 19, 2024  3:41 PM Wess RAMAN wrote: Red Word that prompted transfer to Nurse Triage: Pain to right arm due to wound. Pain level is an 8. Hand went throught a glass window on 04/04/24. Patient went to ER.  Would like new patient appt at [facility] Reason for Disposition  [1] New numbness (loss of sensation) or weakness of hand or finger(s) AND [2] present now  Answer Assessment - Initial Assessment Questions Spoke with pt and her social worker Energy Manager. 11/25 pt had an accident and right arm went through a window. Went to ED and had stitches placed in middle of right arm. Looks like it's healing. Got the stitches out at [facility] last Saturday at [facility] but they accidentally left 2 stitches in. Was also given an abx injection at [facility], ibupfroen and prescribed bactrim. Pt reporting numbness and tingling of right arm, ranges from moderate to severe since leaving the hospital.Feels like her hand locks up, gets tingly and stiff. Pain has not changed, ranges from 5-10/10. Denies redness or swelling. Advised ED today, pt agreeable to go. Advised 911 for worsening symptoms.   Pt also needing TOC appt to [place]. Appt scheduled for 05/16/23. Advised to call back after going to ED to scheduled ED f/u visit.   1. MECHANISM: How did the injury happen?     Arm went through windows 2. ONSET: When did the injury  happen? (e.g., minutes, hours ago)      11/25 3. LOCATION: Where is the injury located? Which arm?     Middle of right arm 4. APPEARANCE of INJURY: What does the injury look like?      Site of injury healing after having stitches placed. Does not look red or swollen  5. SEVERITY: Can you use the arm normally?      Fairly limited 6. SWELLING or BRUISING: is there any swelling or bruising? If Yes, ask: How large is it? (e.g., inches, centimeters)      Bruising, no swelling 7. PAIN: Is there pain? If Yes, ask: How bad is the pain? (Scale 0-10; or none, mild, moderate, severe)     5-10/10 8. TETANUS: For any breaks in the skin, ask: When was your last tetanus booster?     Got a tetanus booster in the ED 9. OTHER SYMPTOMS: Do you have any other symptoms?  (e.g., numbness in hand)     Moderate to severe numbness 10. PREGNANCY: Is there any chance you are pregnant? When was your last menstrual period?       Denies, LMP end of last month.  Protocols used: Arm Injury-A-AH

## 2024-05-15 ENCOUNTER — Encounter: Payer: MEDICAID | Admitting: Family Medicine

## 2024-06-21 ENCOUNTER — Encounter: Payer: MEDICAID | Admitting: Medical
